# Patient Record
Sex: Female | Born: 1965 | Race: White | Hispanic: No | Marital: Married | Smoking: Former smoker
Health system: Southern US, Community
[De-identification: ages and names within clinical notes are randomized; demographics above are authoritative.]

## PROBLEM LIST (undated history)

## (undated) DIAGNOSIS — C801 Malignant (primary) neoplasm, unspecified: Secondary | ICD-10-CM

## (undated) DIAGNOSIS — E559 Vitamin D deficiency, unspecified: Secondary | ICD-10-CM

## (undated) DIAGNOSIS — F329 Major depressive disorder, single episode, unspecified: Secondary | ICD-10-CM

## (undated) DIAGNOSIS — M1990 Unspecified osteoarthritis, unspecified site: Secondary | ICD-10-CM

## (undated) DIAGNOSIS — I2510 Atherosclerotic heart disease of native coronary artery without angina pectoris: Secondary | ICD-10-CM

## (undated) DIAGNOSIS — I219 Acute myocardial infarction, unspecified: Secondary | ICD-10-CM

## (undated) DIAGNOSIS — K635 Polyp of colon: Secondary | ICD-10-CM

## (undated) DIAGNOSIS — E785 Hyperlipidemia, unspecified: Secondary | ICD-10-CM

## (undated) DIAGNOSIS — F32A Depression, unspecified: Secondary | ICD-10-CM

## (undated) DIAGNOSIS — E079 Disorder of thyroid, unspecified: Secondary | ICD-10-CM

## (undated) HISTORY — DX: Unspecified osteoarthritis, unspecified site: M19.90

## (undated) HISTORY — DX: Atherosclerotic heart disease of native coronary artery without angina pectoris: I25.10

## (undated) HISTORY — DX: Depression, unspecified: F32.A

## (undated) HISTORY — DX: Disorder of thyroid, unspecified: E07.9

## (undated) HISTORY — DX: Hyperlipidemia, unspecified: E78.5

## (undated) HISTORY — PX: CORONARY ANGIOPLASTY WITH STENT PLACEMENT: SHX49

## (undated) HISTORY — DX: Polyp of colon: K63.5

## (undated) HISTORY — PX: CARDIAC SURGERY: SHX584

## (undated) HISTORY — DX: Major depressive disorder, single episode, unspecified: F32.9

## (undated) HISTORY — DX: Vitamin D deficiency, unspecified: E55.9

## (undated) HISTORY — PX: TUBAL LIGATION: SHX77

## (undated) NOTE — Progress Notes (Signed)
Formatting of this note might be different from the original.  Patient presents today to discuss findings in recent ultrasound and MRI. No additional concerns.   Electronically signed by Marykay Lex, CMA at 06/17/2022  3:09 PM EST

## (undated) NOTE — Progress Notes (Signed)
Formatting of this note is different from the original.  HPI:       [name] is a 59 y.o. [phone] who LMP was No LMP recorded. Patient is postmenopausal.    Subjective:     She presents today to follow-up after having an MRI at [facility].  She has a history of anal cancer and significant pelvic radiation.  This has caused vaginal scarring and she says her vagina is completely scarred closed.  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  She reports that she previously had normal Pap smears prior to the radiation.  She also had an ultrasound that showed a calcified mass in the lower uterine segment area.  MRI shows this to be a small pedunculated calcified fibroid.  Patient has not had any further vaginal bleeding since her initial workup.  Endometrium by ultrasound and by MRI is not well-visualized but is reported as not thickened.  Patient has some pelvic pain but she has had this for many years and says this has not changed.  She plans on attending pelvic floor therapy to see if she can do something to open her vagina or possibly use dilators for this purpose.  She would like to have intercourse and she would like to have Pap smears in the future if possible.      Hx:  The following portions of the patient's history were reviewed and updated as appropriate:              She  has a past medical history of anal cancer, Arthritis, CAD (coronary artery disease), Colon polyp, Depression, Hyperlipidemia, MI (myocardial infarction) ([place]), Thyroid disease, and Vitamin D deficiency.  She does not have any pertinent problems on file.  She  has a past surgical history that includes Tubal ligation; Cardiac surgery (2010); Coronary angioplasty with stent; Colonoscopy with propofol (N/A, 04/17/2021); and polypectomy (N/A, 04/17/2021).  Her family history includes Alcohol abuse in her brother; Alzheimer's disease in her  maternal grandmother and mother; Breast cancer in her cousin; CAD in her father; Cancer in her paternal aunt; Cirrhosis in her brother; Healthy in her daughter, son, and son; Heart disease in her paternal grandfather, paternal grandmother, and paternal uncle; Hyperlipidemia in her brother and brother; Thyroid disease in her brother and brother.  She  reports that she quit smoking about 15 years ago. Her smoking use included cigarettes. She has a 22.50 pack-year smoking history. She has never used smokeless tobacco. She reports that she does not drink alcohol and does not use drugs.  She has a current medication list which includes the following prescription(s): acetaminophen, aspirin ec, bupropion, cyclobenzaprine, gabapentin, ibuprofen, levothyroxine, loperamide, nitroglycerin, omeprazole, ondansetron, tramadol, and venlafaxine xr.  She is allergic to sulfa antibiotics, sulfasalazine, and tape.    Review of Systems:   Review of Systems    Constitutional: Denied constitutional symptoms, night sweats, recent illness, fatigue, fever, insomnia and weight loss.   Eyes: Denied eye symptoms, eye pain, photophobia, vision change and visual disturbance.   Ears/Nose/Throat/Neck: Denied ear, nose, throat or neck symptoms, hearing loss, nasal discharge, sinus congestion and sore throat.   Cardiovascular: Denied cardiovascular symptoms, arrhythmia, chest pain/pressure, edema, exercise intolerance, orthopnea and palpitations.   Respiratory: Denied pulmonary symptoms, asthma, pleuritic pain, productive sputum, cough, dyspnea and wheezing.   Gastrointestinal: Denied, gastro-esophageal reflux, melena, nausea and vomiting.   Genitourinary: See HPI  for additional information.   Musculoskeletal: Denied musculoskeletal symptoms, stiffness, swelling, muscle weakness and myalgia.   Dermatologic: Denied dermatology symptoms, rash and scar.   Neurologic: Denied neurology symptoms, dizziness, headache, neck pain and syncope.   Psychiatric:  Denied psychiatric symptoms, anxiety and depression.   Endocrine: Denied endocrine symptoms including hot flashes and night sweats.     Meds:    Current Outpatient Medications on File Prior to Visit   Medication Sig Dispense Refill    acetaminophen (TYLENOL) 500 MG tablet Take 1,000 mg by mouth every 6 (six) hours as needed.      aspirin EC 81 MG tablet Take 1 tablet (81 mg total) by mouth daily. Swallow whole. 30 tablet 12    buPROPion (WELLBUTRIN XL) 150 MG 24 hr tablet Take 1 tablet (150 mg total) by mouth daily. 90 tablet 1    cyclobenzaprine (FLEXERIL) 10 MG tablet Take 1 tablet by oral route at bedtime. 30 tablet 0    gabapentin (NEURONTIN) 300 MG capsule TAKE 2 CAPSULES BY MOUTH ONCE DAILY AND 3 NIGHTLY      ibuprofen (ADVIL) 200 MG tablet Take 200 mg by mouth every 6 (six) hours as needed.      levothyroxine (SYNTHROID) 88 MCG tablet TAKE 1 TABLET BY MOUTH ONCE DAILY BEFORE BREAKFAST 90 tablet 0    loperamide (IMODIUM) 2 MG capsule Take 2 mg by mouth as needed for diarrhea or loose stools.      nitroGLYCERIN (NITROSTAT) 0.4 MG SL tablet Place under the tongue.      omeprazole (PRILOSEC OTC) 20 MG tablet Take 1 tablet (20 mg total) by mouth daily.      ondansetron (ZOFRAN) 4 MG tablet Take 1 tablet (4 mg total) by mouth every 8 (eight) hours as needed for nausea or vomiting. 20 tablet 0    traMADol (ULTRAM) 50 MG tablet Take 1 tablet (50 mg total) by mouth daily as needed. 30 tablet 0    venlafaxine XR (EFFEXOR XR) 37.5 MG 24 hr capsule Take 1 capsule (37.5 mg total) by mouth daily with breakfast. 30 capsule 5     No current facility-administered medications on file prior to visit.     Objective:       Vitals:    06/17/22 1443   BP: 110/75   Pulse: 79     Filed Weights    06/17/22 1443   Weight: 182 lb (82.6 kg)             Assessment:     [id number]  Patient Active Problem List    Diagnosis Date Noted    GERD (gastroesophageal reflux disease) 04/14/2022    Class 1 obesity due to excess calories with body mass  index (BMI) of 33.0 to 33.9 in adult 09/09/2021    Chronic diarrhea of unknown origin     Neutropenia ([place]) 02/04/2021    Hot flashes due to menopause 09/12/2020    Class 1 obesity due to excess calories with body mass index (BMI) of 32.0 to 32.9 in adult 06/30/2018    Dyslipidemia 06/21/2017    Depression, recurrent ([place]) 12/17/2014    Vaginal stenosis 12/19/2013    History of anal cancer 07/20/2012    Coronary artery disease involving native coronary artery of native heart with angina pectoris ([place]) 08/27/2010    DDD (degenerative disc disease), lumbosacral 07/28/2010    Hypothyroidism 02/06/2010       1. Pelvic pain    2. Fibroid  Plan:        1.  We have talked through all of the above studies and I have generally reassured her regarding her uterine fibroid and her endometrium.  I do not believe these to be a significant possible source of cancer.  We have also discussed cervical cancer and the fact that she had normal Pap smears her whole life.  It is possible that HPV of the rectum increases her risk for HPV of the cervix.  I have asked her to address this with her oncologist/surgeon.  We have also discussed the possibility of Pap smears in the OR should this be necessary.  At this time she would like to have pelvic floor therapy and try to open the vagina which will allow her to have intercourse as well as possibly Pap smears in the future.  All of her questions were answered and I believe her issues were addressed.  Orders  No orders of the defined types were placed in this encounter.     No orders of the defined types were placed in this encounter.      F/U   No follow-ups on file.  I spent 31 minutes involved in the care of this patient preparing to see the patient by obtaining and reviewing her medical history (including labs, imaging tests and prior procedures), documenting clinical information in the electronic health record (EHR), counseling and coordinating care plans, writing and sending  prescriptions, ordering tests or procedures and in direct communicating with the patient and medical staff discussing pertinent items from her history and physical exam.    Finis Bud, M.D.  06/17/2022  3:06 PM      Electronically signed by Harlin Heys, MD at 06/17/2022  3:09 PM EST

---

## 1993-05-28 ENCOUNTER — Other Ambulatory Visit (HOSPITAL_COMMUNITY): Payer: Self-pay

## 2004-01-08 ENCOUNTER — Other Ambulatory Visit: Payer: Self-pay

## 2005-04-29 ENCOUNTER — Emergency Department: Payer: Self-pay | Admitting: Emergency Medicine

## 2006-08-24 ENCOUNTER — Other Ambulatory Visit: Payer: Self-pay

## 2006-08-25 ENCOUNTER — Inpatient Hospital Stay: Payer: Self-pay | Admitting: Internal Medicine

## 2007-02-15 ENCOUNTER — Ambulatory Visit: Payer: Self-pay | Admitting: Cardiology

## 2007-12-05 ENCOUNTER — Ambulatory Visit: Payer: Self-pay | Admitting: Internal Medicine

## 2008-05-04 HISTORY — PX: CARDIAC SURGERY: SHX584

## 2010-02-03 ENCOUNTER — Ambulatory Visit: Payer: Self-pay | Admitting: Internal Medicine

## 2010-02-06 DIAGNOSIS — E039 Hypothyroidism, unspecified: Secondary | ICD-10-CM | POA: Insufficient documentation

## 2010-07-28 DIAGNOSIS — M5137 Other intervertebral disc degeneration, lumbosacral region: Secondary | ICD-10-CM | POA: Insufficient documentation

## 2010-08-27 DIAGNOSIS — I25119 Atherosclerotic heart disease of native coronary artery with unspecified angina pectoris: Secondary | ICD-10-CM | POA: Insufficient documentation

## 2012-07-20 DIAGNOSIS — C210 Malignant neoplasm of anus, unspecified: Secondary | ICD-10-CM | POA: Insufficient documentation

## 2012-07-20 DIAGNOSIS — Z85048 Personal history of other malignant neoplasm of rectum, rectosigmoid junction, and anus: Secondary | ICD-10-CM | POA: Insufficient documentation

## 2012-11-19 DIAGNOSIS — E2839 Other primary ovarian failure: Secondary | ICD-10-CM | POA: Insufficient documentation

## 2012-12-23 DIAGNOSIS — M7061 Trochanteric bursitis, right hip: Secondary | ICD-10-CM | POA: Insufficient documentation

## 2013-12-19 DIAGNOSIS — N895 Stricture and atresia of vagina: Secondary | ICD-10-CM | POA: Insufficient documentation

## 2014-12-17 DIAGNOSIS — F339 Major depressive disorder, recurrent, unspecified: Secondary | ICD-10-CM | POA: Insufficient documentation

## 2016-05-05 DIAGNOSIS — Z79899 Other long term (current) drug therapy: Secondary | ICD-10-CM | POA: Diagnosis not present

## 2016-05-05 DIAGNOSIS — M549 Dorsalgia, unspecified: Secondary | ICD-10-CM | POA: Diagnosis not present

## 2016-05-05 DIAGNOSIS — E2839 Other primary ovarian failure: Secondary | ICD-10-CM | POA: Diagnosis not present

## 2016-05-05 DIAGNOSIS — G8929 Other chronic pain: Secondary | ICD-10-CM | POA: Diagnosis not present

## 2016-05-05 DIAGNOSIS — M7062 Trochanteric bursitis, left hip: Secondary | ICD-10-CM | POA: Diagnosis not present

## 2016-05-05 DIAGNOSIS — S3993XA Unspecified injury of pelvis, initial encounter: Secondary | ICD-10-CM | POA: Diagnosis not present

## 2016-05-05 DIAGNOSIS — Z87891 Personal history of nicotine dependence: Secondary | ICD-10-CM | POA: Diagnosis not present

## 2016-05-05 DIAGNOSIS — I252 Old myocardial infarction: Secondary | ICD-10-CM | POA: Diagnosis not present

## 2016-05-05 DIAGNOSIS — Z7982 Long term (current) use of aspirin: Secondary | ICD-10-CM | POA: Diagnosis not present

## 2016-05-05 DIAGNOSIS — Z85038 Personal history of other malignant neoplasm of large intestine: Secondary | ICD-10-CM | POA: Diagnosis not present

## 2016-05-05 DIAGNOSIS — Z9221 Personal history of antineoplastic chemotherapy: Secondary | ICD-10-CM | POA: Diagnosis not present

## 2016-05-05 DIAGNOSIS — Z85048 Personal history of other malignant neoplasm of rectum, rectosigmoid junction, and anus: Secondary | ICD-10-CM | POA: Diagnosis not present

## 2016-05-05 DIAGNOSIS — Z923 Personal history of irradiation: Secondary | ICD-10-CM | POA: Diagnosis not present

## 2016-05-05 DIAGNOSIS — I2510 Atherosclerotic heart disease of native coronary artery without angina pectoris: Secondary | ICD-10-CM | POA: Diagnosis not present

## 2016-05-05 DIAGNOSIS — F419 Anxiety disorder, unspecified: Secondary | ICD-10-CM | POA: Diagnosis not present

## 2016-05-05 DIAGNOSIS — M7061 Trochanteric bursitis, right hip: Secondary | ICD-10-CM | POA: Diagnosis not present

## 2016-05-05 DIAGNOSIS — F329 Major depressive disorder, single episode, unspecified: Secondary | ICD-10-CM | POA: Diagnosis not present

## 2016-05-05 DIAGNOSIS — E039 Hypothyroidism, unspecified: Secondary | ICD-10-CM | POA: Diagnosis not present

## 2016-05-05 DIAGNOSIS — Z955 Presence of coronary angioplasty implant and graft: Secondary | ICD-10-CM | POA: Diagnosis not present

## 2016-05-05 DIAGNOSIS — M25551 Pain in right hip: Secondary | ICD-10-CM | POA: Diagnosis not present

## 2016-05-05 DIAGNOSIS — Z882 Allergy status to sulfonamides status: Secondary | ICD-10-CM | POA: Diagnosis not present

## 2016-05-05 DIAGNOSIS — Z951 Presence of aortocoronary bypass graft: Secondary | ICD-10-CM | POA: Diagnosis not present

## 2016-05-05 DIAGNOSIS — S79921A Unspecified injury of right thigh, initial encounter: Secondary | ICD-10-CM | POA: Diagnosis not present

## 2016-05-08 DIAGNOSIS — I252 Old myocardial infarction: Secondary | ICD-10-CM | POA: Diagnosis not present

## 2016-05-08 DIAGNOSIS — Z951 Presence of aortocoronary bypass graft: Secondary | ICD-10-CM | POA: Diagnosis not present

## 2016-05-08 DIAGNOSIS — Z8041 Family history of malignant neoplasm of ovary: Secondary | ICD-10-CM | POA: Diagnosis not present

## 2016-05-08 DIAGNOSIS — Z882 Allergy status to sulfonamides status: Secondary | ICD-10-CM | POA: Diagnosis not present

## 2016-05-08 DIAGNOSIS — E039 Hypothyroidism, unspecified: Secondary | ICD-10-CM | POA: Diagnosis not present

## 2016-05-08 DIAGNOSIS — Z7982 Long term (current) use of aspirin: Secondary | ICD-10-CM | POA: Diagnosis not present

## 2016-05-08 DIAGNOSIS — Z87891 Personal history of nicotine dependence: Secondary | ICD-10-CM | POA: Diagnosis not present

## 2016-05-08 DIAGNOSIS — N8184 Pelvic muscle wasting: Secondary | ICD-10-CM | POA: Diagnosis not present

## 2016-05-08 DIAGNOSIS — M62838 Other muscle spasm: Secondary | ICD-10-CM | POA: Diagnosis not present

## 2016-05-08 DIAGNOSIS — Z85038 Personal history of other malignant neoplasm of large intestine: Secondary | ICD-10-CM | POA: Diagnosis not present

## 2016-05-08 DIAGNOSIS — I2510 Atherosclerotic heart disease of native coronary artery without angina pectoris: Secondary | ICD-10-CM | POA: Diagnosis not present

## 2016-05-08 DIAGNOSIS — Z79899 Other long term (current) drug therapy: Secondary | ICD-10-CM | POA: Diagnosis not present

## 2016-05-08 DIAGNOSIS — R102 Pelvic and perineal pain: Secondary | ICD-10-CM | POA: Diagnosis not present

## 2016-05-08 DIAGNOSIS — Z955 Presence of coronary angioplasty implant and graft: Secondary | ICD-10-CM | POA: Diagnosis not present

## 2016-05-08 DIAGNOSIS — Z9221 Personal history of antineoplastic chemotherapy: Secondary | ICD-10-CM | POA: Diagnosis not present

## 2016-05-08 DIAGNOSIS — N941 Unspecified dyspareunia: Secondary | ICD-10-CM | POA: Diagnosis not present

## 2016-05-08 DIAGNOSIS — Z803 Family history of malignant neoplasm of breast: Secondary | ICD-10-CM | POA: Diagnosis not present

## 2016-05-08 DIAGNOSIS — Z923 Personal history of irradiation: Secondary | ICD-10-CM | POA: Diagnosis not present

## 2016-05-08 DIAGNOSIS — Z85048 Personal history of other malignant neoplasm of rectum, rectosigmoid junction, and anus: Secondary | ICD-10-CM | POA: Diagnosis not present

## 2016-05-14 DIAGNOSIS — M62838 Other muscle spasm: Secondary | ICD-10-CM | POA: Diagnosis not present

## 2016-05-14 DIAGNOSIS — R102 Pelvic and perineal pain: Secondary | ICD-10-CM | POA: Diagnosis not present

## 2016-05-14 DIAGNOSIS — Z955 Presence of coronary angioplasty implant and graft: Secondary | ICD-10-CM | POA: Diagnosis not present

## 2016-05-14 DIAGNOSIS — I2510 Atherosclerotic heart disease of native coronary artery without angina pectoris: Secondary | ICD-10-CM | POA: Diagnosis not present

## 2016-05-14 DIAGNOSIS — N8184 Pelvic muscle wasting: Secondary | ICD-10-CM | POA: Diagnosis not present

## 2016-05-14 DIAGNOSIS — N941 Unspecified dyspareunia: Secondary | ICD-10-CM | POA: Diagnosis not present

## 2016-06-04 DIAGNOSIS — Z955 Presence of coronary angioplasty implant and graft: Secondary | ICD-10-CM | POA: Diagnosis not present

## 2016-06-04 DIAGNOSIS — R102 Pelvic and perineal pain: Secondary | ICD-10-CM | POA: Diagnosis not present

## 2016-06-04 DIAGNOSIS — I2510 Atherosclerotic heart disease of native coronary artery without angina pectoris: Secondary | ICD-10-CM | POA: Diagnosis not present

## 2016-06-04 DIAGNOSIS — N941 Unspecified dyspareunia: Secondary | ICD-10-CM | POA: Diagnosis not present

## 2016-06-04 DIAGNOSIS — N8184 Pelvic muscle wasting: Secondary | ICD-10-CM | POA: Diagnosis not present

## 2016-06-04 DIAGNOSIS — M62838 Other muscle spasm: Secondary | ICD-10-CM | POA: Diagnosis not present

## 2016-07-09 DIAGNOSIS — M79662 Pain in left lower leg: Secondary | ICD-10-CM | POA: Diagnosis not present

## 2016-07-09 DIAGNOSIS — Z882 Allergy status to sulfonamides status: Secondary | ICD-10-CM | POA: Diagnosis not present

## 2016-07-09 DIAGNOSIS — Z85038 Personal history of other malignant neoplasm of large intestine: Secondary | ICD-10-CM | POA: Diagnosis not present

## 2016-07-09 DIAGNOSIS — Z79899 Other long term (current) drug therapy: Secondary | ICD-10-CM | POA: Diagnosis not present

## 2016-07-09 DIAGNOSIS — I2510 Atherosclerotic heart disease of native coronary artery without angina pectoris: Secondary | ICD-10-CM | POA: Diagnosis not present

## 2016-07-09 DIAGNOSIS — I83812 Varicose veins of left lower extremities with pain: Secondary | ICD-10-CM | POA: Diagnosis not present

## 2016-07-09 DIAGNOSIS — Z9221 Personal history of antineoplastic chemotherapy: Secondary | ICD-10-CM | POA: Diagnosis not present

## 2016-07-09 DIAGNOSIS — I252 Old myocardial infarction: Secondary | ICD-10-CM | POA: Diagnosis not present

## 2016-07-09 DIAGNOSIS — Z951 Presence of aortocoronary bypass graft: Secondary | ICD-10-CM | POA: Diagnosis not present

## 2016-07-09 DIAGNOSIS — E039 Hypothyroidism, unspecified: Secondary | ICD-10-CM | POA: Diagnosis not present

## 2016-07-09 DIAGNOSIS — Z7982 Long term (current) use of aspirin: Secondary | ICD-10-CM | POA: Diagnosis not present

## 2016-07-09 DIAGNOSIS — Z87891 Personal history of nicotine dependence: Secondary | ICD-10-CM | POA: Diagnosis not present

## 2016-07-09 DIAGNOSIS — F419 Anxiety disorder, unspecified: Secondary | ICD-10-CM | POA: Diagnosis not present

## 2016-08-26 DIAGNOSIS — N941 Unspecified dyspareunia: Secondary | ICD-10-CM | POA: Diagnosis not present

## 2016-08-26 DIAGNOSIS — G629 Polyneuropathy, unspecified: Secondary | ICD-10-CM | POA: Diagnosis not present

## 2016-08-26 DIAGNOSIS — Z1159 Encounter for screening for other viral diseases: Secondary | ICD-10-CM | POA: Diagnosis not present

## 2016-08-26 DIAGNOSIS — M5441 Lumbago with sciatica, right side: Secondary | ICD-10-CM | POA: Diagnosis not present

## 2016-08-26 DIAGNOSIS — M6289 Other specified disorders of muscle: Secondary | ICD-10-CM | POA: Diagnosis not present

## 2016-08-26 DIAGNOSIS — R200 Anesthesia of skin: Secondary | ICD-10-CM | POA: Diagnosis not present

## 2016-08-26 DIAGNOSIS — M545 Low back pain: Secondary | ICD-10-CM | POA: Diagnosis not present

## 2016-08-26 DIAGNOSIS — G8929 Other chronic pain: Secondary | ICD-10-CM | POA: Diagnosis not present

## 2016-08-26 DIAGNOSIS — R5383 Other fatigue: Secondary | ICD-10-CM | POA: Diagnosis not present

## 2016-08-26 DIAGNOSIS — R202 Paresthesia of skin: Secondary | ICD-10-CM | POA: Diagnosis not present

## 2016-08-26 DIAGNOSIS — H538 Other visual disturbances: Secondary | ICD-10-CM | POA: Diagnosis not present

## 2016-08-26 DIAGNOSIS — R946 Abnormal results of thyroid function studies: Secondary | ICD-10-CM | POA: Diagnosis not present

## 2016-08-26 DIAGNOSIS — H539 Unspecified visual disturbance: Secondary | ICD-10-CM | POA: Diagnosis not present

## 2016-08-26 DIAGNOSIS — F329 Major depressive disorder, single episode, unspecified: Secondary | ICD-10-CM | POA: Diagnosis not present

## 2016-08-26 DIAGNOSIS — D649 Anemia, unspecified: Secondary | ICD-10-CM | POA: Diagnosis not present

## 2016-08-26 DIAGNOSIS — R208 Other disturbances of skin sensation: Secondary | ICD-10-CM | POA: Diagnosis not present

## 2016-09-25 DIAGNOSIS — Z79899 Other long term (current) drug therapy: Secondary | ICD-10-CM | POA: Diagnosis not present

## 2016-09-25 DIAGNOSIS — R42 Dizziness and giddiness: Secondary | ICD-10-CM | POA: Diagnosis not present

## 2016-09-25 DIAGNOSIS — I252 Old myocardial infarction: Secondary | ICD-10-CM | POA: Diagnosis not present

## 2016-09-25 DIAGNOSIS — R5383 Other fatigue: Secondary | ICD-10-CM | POA: Diagnosis not present

## 2016-09-25 DIAGNOSIS — I2510 Atherosclerotic heart disease of native coronary artery without angina pectoris: Secondary | ICD-10-CM | POA: Diagnosis not present

## 2016-09-25 DIAGNOSIS — Z7982 Long term (current) use of aspirin: Secondary | ICD-10-CM | POA: Diagnosis not present

## 2016-09-25 DIAGNOSIS — E039 Hypothyroidism, unspecified: Secondary | ICD-10-CM | POA: Diagnosis not present

## 2016-09-25 DIAGNOSIS — E559 Vitamin D deficiency, unspecified: Secondary | ICD-10-CM | POA: Diagnosis not present

## 2016-09-25 DIAGNOSIS — R200 Anesthesia of skin: Secondary | ICD-10-CM | POA: Diagnosis not present

## 2016-09-25 DIAGNOSIS — R202 Paresthesia of skin: Secondary | ICD-10-CM | POA: Diagnosis not present

## 2016-09-25 DIAGNOSIS — R591 Generalized enlarged lymph nodes: Secondary | ICD-10-CM | POA: Diagnosis not present

## 2016-10-06 DIAGNOSIS — R197 Diarrhea, unspecified: Secondary | ICD-10-CM | POA: Diagnosis not present

## 2016-10-06 DIAGNOSIS — Z79899 Other long term (current) drug therapy: Secondary | ICD-10-CM | POA: Diagnosis not present

## 2016-10-06 DIAGNOSIS — Z923 Personal history of irradiation: Secondary | ICD-10-CM | POA: Diagnosis not present

## 2016-10-06 DIAGNOSIS — E039 Hypothyroidism, unspecified: Secondary | ICD-10-CM | POA: Diagnosis not present

## 2016-10-06 DIAGNOSIS — Z9221 Personal history of antineoplastic chemotherapy: Secondary | ICD-10-CM | POA: Diagnosis not present

## 2016-10-06 DIAGNOSIS — C210 Malignant neoplasm of anus, unspecified: Secondary | ICD-10-CM | POA: Diagnosis not present

## 2016-10-06 DIAGNOSIS — Z08 Encounter for follow-up examination after completed treatment for malignant neoplasm: Secondary | ICD-10-CM | POA: Diagnosis not present

## 2016-10-06 DIAGNOSIS — Z85048 Personal history of other malignant neoplasm of rectum, rectosigmoid junction, and anus: Secondary | ICD-10-CM | POA: Diagnosis not present

## 2016-10-16 DIAGNOSIS — R590 Localized enlarged lymph nodes: Secondary | ICD-10-CM | POA: Diagnosis not present

## 2016-10-16 DIAGNOSIS — N644 Mastodynia: Secondary | ICD-10-CM | POA: Diagnosis not present

## 2016-10-28 DIAGNOSIS — I2510 Atherosclerotic heart disease of native coronary artery without angina pectoris: Secondary | ICD-10-CM | POA: Diagnosis not present

## 2016-10-28 DIAGNOSIS — E785 Hyperlipidemia, unspecified: Secondary | ICD-10-CM | POA: Diagnosis not present

## 2016-10-28 DIAGNOSIS — E039 Hypothyroidism, unspecified: Secondary | ICD-10-CM | POA: Diagnosis not present

## 2016-11-20 DIAGNOSIS — M542 Cervicalgia: Secondary | ICD-10-CM | POA: Diagnosis not present

## 2017-01-05 DIAGNOSIS — E039 Hypothyroidism, unspecified: Secondary | ICD-10-CM | POA: Diagnosis not present

## 2017-01-05 DIAGNOSIS — M25512 Pain in left shoulder: Secondary | ICD-10-CM | POA: Diagnosis not present

## 2017-01-05 DIAGNOSIS — G8911 Acute pain due to trauma: Secondary | ICD-10-CM | POA: Diagnosis not present

## 2017-01-05 DIAGNOSIS — Z1389 Encounter for screening for other disorder: Secondary | ICD-10-CM | POA: Diagnosis not present

## 2017-01-05 DIAGNOSIS — R350 Frequency of micturition: Secondary | ICD-10-CM | POA: Diagnosis not present

## 2017-01-21 DIAGNOSIS — F339 Major depressive disorder, recurrent, unspecified: Secondary | ICD-10-CM | POA: Diagnosis not present

## 2017-01-21 DIAGNOSIS — E559 Vitamin D deficiency, unspecified: Secondary | ICD-10-CM | POA: Diagnosis not present

## 2017-01-21 DIAGNOSIS — E039 Hypothyroidism, unspecified: Secondary | ICD-10-CM | POA: Diagnosis not present

## 2017-01-21 DIAGNOSIS — R109 Unspecified abdominal pain: Secondary | ICD-10-CM | POA: Diagnosis not present

## 2017-01-21 DIAGNOSIS — M25512 Pain in left shoulder: Secondary | ICD-10-CM | POA: Diagnosis not present

## 2017-01-21 DIAGNOSIS — Z23 Encounter for immunization: Secondary | ICD-10-CM | POA: Diagnosis not present

## 2017-01-21 DIAGNOSIS — I259 Chronic ischemic heart disease, unspecified: Secondary | ICD-10-CM | POA: Diagnosis not present

## 2017-01-21 DIAGNOSIS — G893 Neoplasm related pain (acute) (chronic): Secondary | ICD-10-CM | POA: Insufficient documentation

## 2017-01-21 DIAGNOSIS — G8911 Acute pain due to trauma: Secondary | ICD-10-CM | POA: Diagnosis not present

## 2017-01-21 DIAGNOSIS — C210 Malignant neoplasm of anus, unspecified: Secondary | ICD-10-CM | POA: Diagnosis not present

## 2017-02-09 DIAGNOSIS — G893 Neoplasm related pain (acute) (chronic): Secondary | ICD-10-CM | POA: Diagnosis not present

## 2017-02-09 DIAGNOSIS — M7061 Trochanteric bursitis, right hip: Secondary | ICD-10-CM | POA: Diagnosis not present

## 2017-02-09 DIAGNOSIS — K219 Gastro-esophageal reflux disease without esophagitis: Secondary | ICD-10-CM | POA: Diagnosis not present

## 2017-02-09 DIAGNOSIS — M7062 Trochanteric bursitis, left hip: Secondary | ICD-10-CM | POA: Diagnosis not present

## 2017-02-09 DIAGNOSIS — M5137 Other intervertebral disc degeneration, lumbosacral region: Secondary | ICD-10-CM | POA: Diagnosis not present

## 2017-02-09 DIAGNOSIS — C210 Malignant neoplasm of anus, unspecified: Secondary | ICD-10-CM | POA: Diagnosis not present

## 2017-02-09 DIAGNOSIS — F339 Major depressive disorder, recurrent, unspecified: Secondary | ICD-10-CM | POA: Diagnosis not present

## 2017-03-23 ENCOUNTER — Emergency Department: Payer: Medicare Other

## 2017-03-23 ENCOUNTER — Emergency Department
Admission: EM | Admit: 2017-03-23 | Discharge: 2017-03-23 | Disposition: A | Discharge status: Home / Self Care | Payer: Medicare Other | Attending: Emergency Medicine | Admitting: Emergency Medicine

## 2017-03-23 ENCOUNTER — Encounter: Payer: Self-pay | Admitting: Emergency Medicine

## 2017-03-23 DIAGNOSIS — I200 Unstable angina: Secondary | ICD-10-CM | POA: Insufficient documentation

## 2017-03-23 DIAGNOSIS — R079 Chest pain, unspecified: Secondary | ICD-10-CM | POA: Diagnosis not present

## 2017-03-23 DIAGNOSIS — Z7901 Long term (current) use of anticoagulants: Secondary | ICD-10-CM | POA: Insufficient documentation

## 2017-03-23 DIAGNOSIS — I252 Old myocardial infarction: Secondary | ICD-10-CM | POA: Diagnosis not present

## 2017-03-23 DIAGNOSIS — Z859 Personal history of malignant neoplasm, unspecified: Secondary | ICD-10-CM | POA: Insufficient documentation

## 2017-03-23 HISTORY — DX: Malignant (primary) neoplasm, unspecified: C80.1

## 2017-03-23 HISTORY — DX: Acute myocardial infarction, unspecified: I21.9

## 2017-03-23 LAB — BASIC METABOLIC PANEL
Anion gap: 7 (ref 5–15)
BUN: 16 mg/dL (ref 6–20)
CHLORIDE: 105 mmol/L (ref 101–111)
CO2: 27 mmol/L (ref 22–32)
Calcium: 8.8 mg/dL — ABNORMAL LOW (ref 8.9–10.3)
Creatinine, Ser: 0.72 mg/dL (ref 0.44–1.00)
GFR calc Af Amer: >60 mL/min (ref >60)
GFR calc non Af Amer: >60 mL/min (ref >60)
GLUCOSE: 95 mg/dL (ref 65–99)
POTASSIUM: 3.1 mmol/L — AB (ref 3.5–5.1)
Sodium: 139 mmol/L (ref 135–145)

## 2017-03-23 LAB — TROPONIN I
Troponin I: <0.03 ng/mL (ref <0.03)
Troponin I: <0.03 ng/mL (ref <0.03)

## 2017-03-23 LAB — CBC
HEMATOCRIT: 38.8 % (ref 35.0–47.0)
Hemoglobin: 13.1 g/dL (ref 12.0–16.0)
MCH: 33.3 pg (ref 26.0–34.0)
MCHC: 33.8 g/dL (ref 32.0–36.0)
MCV: 98.5 fL (ref 80.0–100.0)
Platelets: 185 10*3/uL (ref 150–440)
RBC: 3.94 MIL/uL (ref 3.80–5.20)
RDW: 14.1 % (ref 11.5–14.5)
WBC: 2.7 10*3/uL — ABNORMAL LOW (ref 3.6–11.0)

## 2017-03-23 MED ORDER — NITROGLYCERIN 0.4 MG SL SUBL: 0.4000 mg | SUBLINGUAL_TABLET | SUBLINGUAL | 3 refills | Status: DC | PRN

## 2017-03-23 MED ORDER — NITROGLYCERIN 0.4 MG SL SUBL
0.4000 mg | SUBLINGUAL_TABLET | Freq: Once | SUBLINGUAL | Status: AC
  Administered 2017-03-23: 0.4 mg via SUBLINGUAL

## 2017-03-23 MED ORDER — NITROGLYCERIN 0.4 MG SL SUBL
SUBLINGUAL_TABLET | SUBLINGUAL | Status: AC
  Filled 2017-03-23: qty 1

## 2017-03-23 NOTE — ED Provider Notes (Signed)
[facility] Emergency Department Provider Note       Time seen: ----------------------------------------- 8:24 PM on 03/23/2017 -----------------------------------------   I have reviewed the triage vital signs and the nursing notes.  HISTORY   Chief Complaint Chest Pain    HPI Brenda Grimes is a 51 y.o. female with a history of cancer and MI who presents to the ED for intermittent chest pain for the last month.  Patient notes that it is worse typically when she lays down at night.  She is not sure if it wakes her up from sleep.  Patient reports she had an MI 10 years ago when she cannot remember exactly how she felt.  She does take blood thinners but has not had to take any nitroglycerin.  She also reports she cannot take her hyperlipidemia medications due to muscle pain.  Pain is mild at this time.  Past Medical History:  Diagnosis Date  . Cancer ([place])   . MI (myocardial infarction) ([place])     There are no active problems to display for this patient.   Past Surgical History:  Procedure Laterality Date  . TUBAL LIGATION      Allergies Sulfa antibiotics  Social History Social History   Tobacco Use  . Smoking status: Never Smoker  . Smokeless tobacco: Never Used  Substance Use Topics  . Alcohol use: No    Frequency: Never  . Drug use: No    Review of Systems Constitutional: Negative for fever. Eyes: Negative for vision changes ENT:  Negative for congestion, sore throat Cardiovascular: positive for chest pain Respiratory: Negative for shortness of breath. Gastrointestinal: Negative for abdominal pain, vomiting and diarrhea. Genitourinary: Negative for dysuria. Musculoskeletal: Negative for back pain. Skin: Negative for rash. Neurological: Negative for headaches, focal weakness or numbness.  All systems negative/normal/unremarkable except as stated in the HPI  ____________________________________________   PHYSICAL EXAM:  VITAL  SIGNS: ED Triage Vitals  Enc Vitals Group     BP 03/23/17 1726 111/79     Pulse Rate 03/23/17 1726 88     Resp 03/23/17 1726 16     Temp 03/23/17 1726 97.8 F (36.6 C)     Temp Source 03/23/17 1726 Oral     SpO2 03/23/17 1726 100 %     Weight 03/23/17 1723 190 lb (86.2 kg)     Height 03/23/17 1723 5\' 4"  (1.626 m)     Head Circumference --      Peak Flow --      Pain Score 03/23/17 1723 4     Pain Loc --      Pain Edu? --      Excl. in [place]? --     Constitutional: Alert and oriented. Well appearing and in no distress. Eyes: Conjunctivae are normal. Normal extraocular movements. ENT   Head: Normocephalic and atraumatic.   Nose: No congestion/rhinnorhea.   Mouth/Throat: Mucous membranes are moist.   Neck: No stridor. Cardiovascular: Normal rate, regular rhythm. No murmurs, rubs, or gallops. Respiratory: Normal respiratory effort without tachypnea nor retractions. Breath sounds are clear and equal bilaterally. No wheezes/rales/rhonchi. Gastrointestinal: Soft and nontender. Normal bowel sounds Musculoskeletal: Nontender with normal range of motion in extremities. No lower extremity tenderness nor edema. Neurologic:  Normal speech and language. No gross focal neurologic deficits are appreciated.  Skin:  Skin is warm, dry and intact. No rash noted. Psychiatric: Mood and affect are normal. Speech and behavior are normal.  ____________________________________________  EKG: Interpreted by me.  Sinus rhythm  rate 89 bpm, normal PR interval, incomplete right bundle branch block, normal QT  ____________________________________________  ED COURSE:  Pertinent labs & imaging results that were available during my care of the patient were reviewed by me and considered in my medical decision making (see chart for details). Patient presents for chest pain, we will assess with labs and imaging as indicated.   Procedures ____________________________________________   LABS  (pertinent positives/negatives)  Labs Reviewed  BASIC METABOLIC PANEL - Abnormal; Notable for the following components:      Result Value   Potassium 3.1 (*)    Calcium 8.8 (*)    All other components within normal limits  CBC - Abnormal; Notable for the following components:   WBC 2.7 (*)    All other components within normal limits  TROPONIN I  TROPONIN I    RADIOLOGY  Chest x-ray is unremarkable  ____________________________________________  DIFFERENTIAL DIAGNOSIS   Prinzmetal's angina, unstable angina, MI, PE, dissection, GERD, muscle spasm  FINAL ASSESSMENT AND PLAN  Chest pain  Plan: Patient had presented for chest pain. Patient's labs were negative. Patient's imaging was also negative.  She does have symptoms of angina and I will prescribe nitroglycerin for her.  She does not want to stay in the hospital.  I have advised that she does need follow-up with cardiology urgently for stress testing or cardiac catheterization.  I will prescribe nitroglycerin for her to use at home as needed.  She is encouraged to return for worsening or worrisome symptoms in any way.   Earleen Newport, MD   Note: This note was generated in part or whole with voice recognition software. Voice recognition is usually quite accurate but there are transcription errors that can and very often do occur. I apologize for any typographical errors that were not detected and corrected.     Earleen Newport, MD 03/23/17 [phone]

## 2017-03-23 NOTE — ED Triage Notes (Signed)
Patient presents to ED via POV from home with c/o on and off CP x 1 month. Patient states today she had a sudden onset of the pain while driving. Hx of MI 10 years ago. Patient states the pain feels similar.

## 2017-03-30 DIAGNOSIS — K2960 Other gastritis without bleeding: Secondary | ICD-10-CM | POA: Diagnosis not present

## 2017-03-30 DIAGNOSIS — Z79899 Other long term (current) drug therapy: Secondary | ICD-10-CM | POA: Diagnosis not present

## 2017-03-30 DIAGNOSIS — Z1211 Encounter for screening for malignant neoplasm of colon: Secondary | ICD-10-CM | POA: Diagnosis not present

## 2017-03-30 DIAGNOSIS — Z9221 Personal history of antineoplastic chemotherapy: Secondary | ICD-10-CM | POA: Diagnosis not present

## 2017-03-30 DIAGNOSIS — D369 Benign neoplasm, unspecified site: Secondary | ICD-10-CM | POA: Diagnosis not present

## 2017-03-30 DIAGNOSIS — Z882 Allergy status to sulfonamides status: Secondary | ICD-10-CM | POA: Diagnosis not present

## 2017-03-30 DIAGNOSIS — K210 Gastro-esophageal reflux disease with esophagitis: Secondary | ICD-10-CM | POA: Diagnosis not present

## 2017-03-30 DIAGNOSIS — K2940 Chronic atrophic gastritis without bleeding: Secondary | ICD-10-CM | POA: Diagnosis not present

## 2017-03-30 DIAGNOSIS — I2510 Atherosclerotic heart disease of native coronary artery without angina pectoris: Secondary | ICD-10-CM | POA: Diagnosis not present

## 2017-03-30 DIAGNOSIS — K222 Esophageal obstruction: Secondary | ICD-10-CM | POA: Diagnosis not present

## 2017-03-30 DIAGNOSIS — K5730 Diverticulosis of large intestine without perforation or abscess without bleeding: Secondary | ICD-10-CM | POA: Diagnosis not present

## 2017-03-30 DIAGNOSIS — K6389 Other specified diseases of intestine: Secondary | ICD-10-CM | POA: Diagnosis not present

## 2017-03-30 DIAGNOSIS — K2930 Chronic superficial gastritis without bleeding: Secondary | ICD-10-CM | POA: Diagnosis not present

## 2017-03-30 DIAGNOSIS — Z85048 Personal history of other malignant neoplasm of rectum, rectosigmoid junction, and anus: Secondary | ICD-10-CM | POA: Diagnosis not present

## 2017-03-30 DIAGNOSIS — D123 Benign neoplasm of transverse colon: Secondary | ICD-10-CM | POA: Diagnosis not present

## 2017-03-30 DIAGNOSIS — E039 Hypothyroidism, unspecified: Secondary | ICD-10-CM | POA: Diagnosis not present

## 2017-03-30 DIAGNOSIS — Z7951 Long term (current) use of inhaled steroids: Secondary | ICD-10-CM | POA: Diagnosis not present

## 2017-03-30 DIAGNOSIS — R1013 Epigastric pain: Secondary | ICD-10-CM | POA: Diagnosis not present

## 2017-03-30 DIAGNOSIS — D126 Benign neoplasm of colon, unspecified: Secondary | ICD-10-CM | POA: Diagnosis not present

## 2017-03-30 DIAGNOSIS — K449 Diaphragmatic hernia without obstruction or gangrene: Secondary | ICD-10-CM | POA: Diagnosis not present

## 2017-03-30 DIAGNOSIS — K648 Other hemorrhoids: Secondary | ICD-10-CM | POA: Diagnosis not present

## 2017-03-30 DIAGNOSIS — Z7982 Long term (current) use of aspirin: Secondary | ICD-10-CM | POA: Diagnosis not present

## 2017-03-30 DIAGNOSIS — Z923 Personal history of irradiation: Secondary | ICD-10-CM | POA: Diagnosis not present

## 2017-03-30 DIAGNOSIS — Z951 Presence of aortocoronary bypass graft: Secondary | ICD-10-CM | POA: Diagnosis not present

## 2017-03-30 DIAGNOSIS — I252 Old myocardial infarction: Secondary | ICD-10-CM | POA: Diagnosis not present

## 2017-03-30 DIAGNOSIS — Z85038 Personal history of other malignant neoplasm of large intestine: Secondary | ICD-10-CM | POA: Diagnosis not present

## 2017-03-30 DIAGNOSIS — K228 Other specified diseases of esophagus: Secondary | ICD-10-CM | POA: Diagnosis not present

## 2017-03-30 LAB — HM COLONOSCOPY

## 2017-06-14 DIAGNOSIS — R454 Irritability and anger: Secondary | ICD-10-CM | POA: Diagnosis not present

## 2017-06-14 DIAGNOSIS — R6882 Decreased libido: Secondary | ICD-10-CM | POA: Diagnosis not present

## 2017-06-16 DIAGNOSIS — R12 Heartburn: Secondary | ICD-10-CM | POA: Diagnosis not present

## 2017-06-16 DIAGNOSIS — R079 Chest pain, unspecified: Secondary | ICD-10-CM | POA: Diagnosis not present

## 2017-06-16 DIAGNOSIS — R109 Unspecified abdominal pain: Secondary | ICD-10-CM | POA: Diagnosis not present

## 2017-06-16 DIAGNOSIS — Z7982 Long term (current) use of aspirin: Secondary | ICD-10-CM | POA: Diagnosis not present

## 2017-06-16 DIAGNOSIS — I252 Old myocardial infarction: Secondary | ICD-10-CM | POA: Diagnosis not present

## 2017-06-16 DIAGNOSIS — E039 Hypothyroidism, unspecified: Secondary | ICD-10-CM | POA: Diagnosis not present

## 2017-06-16 DIAGNOSIS — Z79899 Other long term (current) drug therapy: Secondary | ICD-10-CM | POA: Diagnosis not present

## 2017-06-16 DIAGNOSIS — Z87891 Personal history of nicotine dependence: Secondary | ICD-10-CM | POA: Diagnosis not present

## 2017-06-16 DIAGNOSIS — Z923 Personal history of irradiation: Secondary | ICD-10-CM | POA: Diagnosis not present

## 2017-06-16 DIAGNOSIS — E785 Hyperlipidemia, unspecified: Secondary | ICD-10-CM | POA: Diagnosis not present

## 2017-06-16 DIAGNOSIS — I25119 Atherosclerotic heart disease of native coronary artery with unspecified angina pectoris: Secondary | ICD-10-CM | POA: Diagnosis not present

## 2017-06-16 DIAGNOSIS — F329 Major depressive disorder, single episode, unspecified: Secondary | ICD-10-CM | POA: Diagnosis not present

## 2017-06-16 DIAGNOSIS — Z882 Allergy status to sulfonamides status: Secondary | ICD-10-CM | POA: Diagnosis not present

## 2017-06-21 DIAGNOSIS — E785 Hyperlipidemia, unspecified: Secondary | ICD-10-CM | POA: Insufficient documentation

## 2017-06-23 DIAGNOSIS — M1611 Unilateral primary osteoarthritis, right hip: Secondary | ICD-10-CM | POA: Diagnosis not present

## 2017-06-23 DIAGNOSIS — M160 Bilateral primary osteoarthritis of hip: Secondary | ICD-10-CM | POA: Diagnosis not present

## 2017-06-23 DIAGNOSIS — M25551 Pain in right hip: Secondary | ICD-10-CM | POA: Diagnosis not present

## 2017-06-25 DIAGNOSIS — Z79899 Other long term (current) drug therapy: Secondary | ICD-10-CM | POA: Diagnosis not present

## 2017-06-25 DIAGNOSIS — G8929 Other chronic pain: Secondary | ICD-10-CM | POA: Diagnosis not present

## 2017-06-25 DIAGNOSIS — Z791 Long term (current) use of non-steroidal anti-inflammatories (NSAID): Secondary | ICD-10-CM | POA: Diagnosis not present

## 2017-06-25 DIAGNOSIS — Z8249 Family history of ischemic heart disease and other diseases of the circulatory system: Secondary | ICD-10-CM | POA: Diagnosis not present

## 2017-06-25 DIAGNOSIS — Z951 Presence of aortocoronary bypass graft: Secondary | ICD-10-CM | POA: Diagnosis not present

## 2017-06-25 DIAGNOSIS — Z85038 Personal history of other malignant neoplasm of large intestine: Secondary | ICD-10-CM | POA: Diagnosis not present

## 2017-06-25 DIAGNOSIS — Z85048 Personal history of other malignant neoplasm of rectum, rectosigmoid junction, and anus: Secondary | ICD-10-CM | POA: Diagnosis not present

## 2017-06-25 DIAGNOSIS — Z87891 Personal history of nicotine dependence: Secondary | ICD-10-CM | POA: Diagnosis not present

## 2017-06-25 DIAGNOSIS — E039 Hypothyroidism, unspecified: Secondary | ICD-10-CM | POA: Diagnosis not present

## 2017-06-25 DIAGNOSIS — Z79891 Long term (current) use of opiate analgesic: Secondary | ICD-10-CM | POA: Diagnosis not present

## 2017-06-25 DIAGNOSIS — I2510 Atherosclerotic heart disease of native coronary artery without angina pectoris: Secondary | ICD-10-CM | POA: Diagnosis not present

## 2017-06-25 DIAGNOSIS — M5137 Other intervertebral disc degeneration, lumbosacral region: Secondary | ICD-10-CM | POA: Diagnosis not present

## 2017-06-25 DIAGNOSIS — M47814 Spondylosis without myelopathy or radiculopathy, thoracic region: Secondary | ICD-10-CM | POA: Diagnosis not present

## 2017-06-25 DIAGNOSIS — Z7982 Long term (current) use of aspirin: Secondary | ICD-10-CM | POA: Diagnosis not present

## 2017-06-25 DIAGNOSIS — F419 Anxiety disorder, unspecified: Secondary | ICD-10-CM | POA: Diagnosis not present

## 2017-06-25 DIAGNOSIS — M47816 Spondylosis without myelopathy or radiculopathy, lumbar region: Secondary | ICD-10-CM | POA: Diagnosis not present

## 2017-06-25 DIAGNOSIS — Z7951 Long term (current) use of inhaled steroids: Secondary | ICD-10-CM | POA: Diagnosis not present

## 2017-06-25 DIAGNOSIS — Z882 Allergy status to sulfonamides status: Secondary | ICD-10-CM | POA: Diagnosis not present

## 2017-06-25 DIAGNOSIS — M5030 Other cervical disc degeneration, unspecified cervical region: Secondary | ICD-10-CM | POA: Diagnosis not present

## 2017-06-25 DIAGNOSIS — M5033 Other cervical disc degeneration, cervicothoracic region: Secondary | ICD-10-CM | POA: Diagnosis not present

## 2017-06-25 DIAGNOSIS — Z923 Personal history of irradiation: Secondary | ICD-10-CM | POA: Diagnosis not present

## 2017-06-25 DIAGNOSIS — M545 Low back pain: Secondary | ICD-10-CM | POA: Diagnosis not present

## 2017-06-25 DIAGNOSIS — M47812 Spondylosis without myelopathy or radiculopathy, cervical region: Secondary | ICD-10-CM | POA: Diagnosis not present

## 2017-06-25 DIAGNOSIS — Z9221 Personal history of antineoplastic chemotherapy: Secondary | ICD-10-CM | POA: Diagnosis not present

## 2017-06-25 DIAGNOSIS — F329 Major depressive disorder, single episode, unspecified: Secondary | ICD-10-CM | POA: Diagnosis not present

## 2017-06-25 DIAGNOSIS — I252 Old myocardial infarction: Secondary | ICD-10-CM | POA: Diagnosis not present

## 2017-06-28 DIAGNOSIS — Z0389 Encounter for observation for other suspected diseases and conditions ruled out: Secondary | ICD-10-CM | POA: Diagnosis not present

## 2017-06-28 DIAGNOSIS — R079 Chest pain, unspecified: Secondary | ICD-10-CM | POA: Diagnosis not present

## 2017-07-05 DIAGNOSIS — G8911 Acute pain due to trauma: Secondary | ICD-10-CM | POA: Diagnosis not present

## 2017-07-05 DIAGNOSIS — C210 Malignant neoplasm of anus, unspecified: Secondary | ICD-10-CM | POA: Diagnosis not present

## 2017-07-05 DIAGNOSIS — M25512 Pain in left shoulder: Secondary | ICD-10-CM | POA: Diagnosis not present

## 2017-07-05 DIAGNOSIS — K209 Esophagitis, unspecified: Secondary | ICD-10-CM | POA: Diagnosis not present

## 2017-07-05 DIAGNOSIS — M5137 Other intervertebral disc degeneration, lumbosacral region: Secondary | ICD-10-CM | POA: Diagnosis not present

## 2017-07-05 DIAGNOSIS — M7062 Trochanteric bursitis, left hip: Secondary | ICD-10-CM | POA: Diagnosis not present

## 2017-07-05 DIAGNOSIS — M7061 Trochanteric bursitis, right hip: Secondary | ICD-10-CM | POA: Diagnosis not present

## 2017-07-05 DIAGNOSIS — G8929 Other chronic pain: Secondary | ICD-10-CM | POA: Diagnosis not present

## 2017-07-05 DIAGNOSIS — G893 Neoplasm related pain (acute) (chronic): Secondary | ICD-10-CM | POA: Diagnosis not present

## 2017-07-05 DIAGNOSIS — I25119 Atherosclerotic heart disease of native coronary artery with unspecified angina pectoris: Secondary | ICD-10-CM | POA: Diagnosis not present

## 2017-07-05 DIAGNOSIS — M5441 Lumbago with sciatica, right side: Secondary | ICD-10-CM | POA: Diagnosis not present

## 2017-07-12 DIAGNOSIS — Z7689 Persons encountering health services in other specified circumstances: Secondary | ICD-10-CM | POA: Diagnosis not present

## 2017-07-12 DIAGNOSIS — R0982 Postnasal drip: Secondary | ICD-10-CM | POA: Diagnosis not present

## 2017-07-12 DIAGNOSIS — R10826 Epigastric rebound abdominal tenderness: Secondary | ICD-10-CM | POA: Diagnosis not present

## 2017-07-12 DIAGNOSIS — F339 Major depressive disorder, recurrent, unspecified: Secondary | ICD-10-CM | POA: Diagnosis not present

## 2017-07-12 DIAGNOSIS — E039 Hypothyroidism, unspecified: Secondary | ICD-10-CM | POA: Diagnosis not present

## 2017-07-12 DIAGNOSIS — E782 Mixed hyperlipidemia: Secondary | ICD-10-CM | POA: Diagnosis not present

## 2017-07-12 DIAGNOSIS — K209 Esophagitis, unspecified: Secondary | ICD-10-CM | POA: Diagnosis not present

## 2017-07-23 DIAGNOSIS — M47812 Spondylosis without myelopathy or radiculopathy, cervical region: Secondary | ICD-10-CM | POA: Diagnosis not present

## 2017-07-23 DIAGNOSIS — M5126 Other intervertebral disc displacement, lumbar region: Secondary | ICD-10-CM | POA: Diagnosis not present

## 2017-07-23 DIAGNOSIS — M47816 Spondylosis without myelopathy or radiculopathy, lumbar region: Secondary | ICD-10-CM | POA: Diagnosis not present

## 2017-09-01 DIAGNOSIS — L570 Actinic keratosis: Secondary | ICD-10-CM | POA: Diagnosis not present

## 2017-09-01 DIAGNOSIS — B359 Dermatophytosis, unspecified: Secondary | ICD-10-CM | POA: Diagnosis not present

## 2017-09-09 DIAGNOSIS — L570 Actinic keratosis: Secondary | ICD-10-CM | POA: Diagnosis not present

## 2017-09-09 DIAGNOSIS — L920 Granuloma annulare: Secondary | ICD-10-CM | POA: Diagnosis not present

## 2017-09-09 DIAGNOSIS — L821 Other seborrheic keratosis: Secondary | ICD-10-CM | POA: Diagnosis not present

## 2017-10-05 DIAGNOSIS — C210 Malignant neoplasm of anus, unspecified: Secondary | ICD-10-CM | POA: Diagnosis not present

## 2018-01-20 ENCOUNTER — Other Ambulatory Visit: Payer: Self-pay

## 2018-01-20 ENCOUNTER — Ambulatory Visit (INDEPENDENT_AMBULATORY_CARE_PROVIDER_SITE_OTHER): Payer: Medicare Other | Admitting: Nurse Practitioner

## 2018-01-20 ENCOUNTER — Encounter: Payer: Self-pay | Admitting: Nurse Practitioner

## 2018-01-20 VITALS — BP 113/66 | HR 92 | Temp 98.2°F | Ht 64.0 in | Wt 195.0 lb

## 2018-01-20 DIAGNOSIS — E559 Vitamin D deficiency, unspecified: Secondary | ICD-10-CM | POA: Diagnosis not present

## 2018-01-20 DIAGNOSIS — M7061 Trochanteric bursitis, right hip: Secondary | ICD-10-CM | POA: Diagnosis not present

## 2018-01-20 DIAGNOSIS — Z7689 Persons encountering health services in other specified circumstances: Secondary | ICD-10-CM

## 2018-01-20 DIAGNOSIS — E782 Mixed hyperlipidemia: Secondary | ICD-10-CM | POA: Diagnosis not present

## 2018-01-20 DIAGNOSIS — M545 Low back pain: Secondary | ICD-10-CM | POA: Diagnosis not present

## 2018-01-20 DIAGNOSIS — G8929 Other chronic pain: Secondary | ICD-10-CM | POA: Diagnosis not present

## 2018-01-20 DIAGNOSIS — E039 Hypothyroidism, unspecified: Secondary | ICD-10-CM

## 2018-01-20 DIAGNOSIS — F324 Major depressive disorder, single episode, in partial remission: Secondary | ICD-10-CM

## 2018-01-20 DIAGNOSIS — R109 Unspecified abdominal pain: Secondary | ICD-10-CM

## 2018-01-20 DIAGNOSIS — K625 Hemorrhage of anus and rectum: Secondary | ICD-10-CM

## 2018-01-20 DIAGNOSIS — G893 Neoplasm related pain (acute) (chronic): Secondary | ICD-10-CM

## 2018-01-20 DIAGNOSIS — M7062 Trochanteric bursitis, left hip: Secondary | ICD-10-CM | POA: Diagnosis not present

## 2018-01-20 MED ORDER — PREDNISONE 10 MG PO TABS: ORAL_TABLET | ORAL | 0 refills | Status: DC

## 2018-01-20 MED ORDER — LEVOTHYROXINE SODIUM 75 MCG PO TABS: 75.0000 ug | ORAL_TABLET | Freq: Every day | ORAL | 5 refills | Status: DC

## 2018-01-20 MED ORDER — BUPROPION HCL ER (XL) 150 MG PO TB24: 150.0000 mg | ORAL_TABLET | Freq: Every day | ORAL | 5 refills | Status: DC

## 2018-01-20 MED ORDER — EZETIMIBE 10 MG PO TABS: 5.0000 mg | ORAL_TABLET | ORAL | 11 refills | Status: DC

## 2018-01-20 MED ORDER — ROSUVASTATIN CALCIUM 5 MG PO TABS: 5.0000 mg | ORAL_TABLET | ORAL | 5 refills | Status: DC

## 2018-01-20 MED ORDER — GABAPENTIN 100 MG PO CAPS: ORAL_CAPSULE | ORAL | 5 refills | Status: DC

## 2018-01-20 MED ORDER — TRAMADOL HCL 50 MG PO TABS: 100.0000 mg | ORAL_TABLET | Freq: Three times a day (TID) | ORAL | 0 refills | Status: AC

## 2018-01-20 NOTE — Progress Notes (Signed)
Subjective:    Patient ID: Brenda Grimes, female    DOB: 03-15-1966, 52 y.o.   MRN: [id number]  Brenda Grimes is a 52 y.o. female presenting on 01/20/2018 for Establish Care (bilateral hip pain , normal get injections in bursa by orthopedic ) and Obesity (pt want to work on weight management. She feels like her weight is possible causing the hip pain )   HPI Establish Care New Provider Pt last seen by PCP at [facility] and [facility] over last 2-3 years.  Obtain records from [place].   Bursitis Has had frequent bursitis.  Last time she had injections, she was referred to orthopedic surgeon.  Has had injections "lots" of times, at least 5 times.  More frequently since cancer treatments.  Worse on R hip.  Is not preventing movement, but is having more pain and wakes her during sleep.  Is using lidocaine patch for pain, but not resolving pain.  Swelling is not improving.  Anal Cancer history: Patient has prolonged abdominal pain, states it is always improved with Tramadol in past.  Past Medical History:  Diagnosis Date  . Arthritis   . Cancer ([place])    anus cancer  . Colon polyp   . Depression   . Hyperlipidemia   . MI (myocardial infarction) ([place])   . Thyroid disease    Past Surgical History:  Procedure Laterality Date  . CARDIAC SURGERY    . TUBAL LIGATION     Social History   Socioeconomic History  . Marital status: Married    Spouse name: Not on file  . Number of children: Not on file  . Years of education: Not on file  . Highest education level: Not on file  Occupational History  . Not on file  Social Needs  . Financial resource strain: Not on file  . Food insecurity:    Worry: Not on file    Inability: Not on file  . Transportation needs:    Medical: Not on file    Non-medical: Not on file  Tobacco Use  . Smoking status: Former Smoker    Years: 30.00    Last attempt to quit: 01/21/2007    Years since quitting: 11.0  . Smokeless tobacco: Never Used    Substance and Sexual Activity  . Alcohol use: No    Frequency: Never  . Drug use: No  . Sexual activity: Not on file  Lifestyle  . Physical activity:    Days per week: Not on file    Minutes per session: Not on file  . Stress: Not on file  Relationships  . Social connections:    Talks on phone: Not on file    Gets together: Not on file    Attends religious service: Not on file    Active member of club or organization: Not on file    Attends meetings of clubs or organizations: Not on file    Relationship status: Not on file  . Intimate partner violence:    Fear of current or ex partner: Not on file    Emotionally abused: Not on file    Physically abused: Not on file    Forced sexual activity: Not on file  Other Topics Concern  . Not on file  Social History Narrative  . Not on file   Family History  Problem Relation Age of Onset  . Alzheimer's disease Mother   . Stroke Brother    Current Outpatient Medications on File Prior to Visit  Medication Sig  . aspirin 81 MG chewable tablet Chew by mouth.  Marland Kitchen buPROPion (WELLBUTRIN XL) 150 MG 24 hr tablet Take by mouth.  . ezetimibe (ZETIA) 10 MG tablet Take by mouth.  . fluticasone (FLONASE) 50 MCG/ACT nasal spray 1 spray by Each Nare route daily.  Marland Kitchen gabapentin (NEURONTIN) 100 MG capsule Take by mouth.  . levothyroxine (SYNTHROID, LEVOTHROID) 75 MCG tablet Take by mouth.  . naproxen (NAPROSYN) 500 MG tablet Take by mouth.  . rosuvastatin (CRESTOR) 5 MG tablet Take by mouth.  . traMADol (ULTRAM) 50 MG tablet Take by mouth.  . nitroGLYCERIN (NITROSTAT) 0.4 MG SL tablet Place 1 tablet (0.4 mg total) under the tongue every 5 (five) minutes as needed for chest pain. (Patient not taking: Reported on 01/20/2018)   No current facility-administered medications on file prior to visit.     Review of Systems Per HPI unless specifically indicated above     Objective:    BP 113/66 (BP Location: Left Arm, Patient Position: Sitting, Cuff  Size: Normal)   Pulse 92   Temp 98.2 F (36.8 C) (Oral)   Ht 5\' 4"  (1.626 m)   Wt 195 lb (88.5 kg)   SpO2 99%   BMI 33.47 kg/m   Wt Readings from Last 3 Encounters:  01/20/18 195 lb (88.5 kg)  03/23/17 190 lb (86.2 kg)    Physical Exam  Constitutional: She is oriented to person, place, and time. She appears well-developed and well-nourished. No distress.  HENT:  Head: Normocephalic and atraumatic.  Musculoskeletal:       Right hip: She exhibits tenderness (lateral hip).       Left hip: She exhibits tenderness (lateral hip).  Neurological: She is alert and oriented to person, place, and time.  Skin: Skin is warm and dry.  Psychiatric: She has a normal mood and affect. Her behavior is normal.  Vitals reviewed.    Results for orders placed or performed during the hospital encounter of 16/10/96  Basic metabolic panel  Result Value Ref Range   Sodium 139 135 - 145 mmol/L   Potassium 3.1 (L) 3.5 - 5.1 mmol/L   Chloride 105 101 - 111 mmol/L   CO2 27 22 - 32 mmol/L   Glucose, Bld 95 65 - 99 mg/dL   BUN 16 6 - 20 mg/dL   Creatinine, Ser 0.72 0.44 - 1.00 mg/dL   Calcium 8.8 (L) 8.9 - 10.3 mg/dL   GFR calc non Af Amer >60 >60 mL/min   GFR calc Af Amer >60 >60 mL/min   Anion gap 7 5 - 15  CBC  Result Value Ref Range   WBC 2.7 (L) 3.6 - 11.0 K/uL   RBC 3.94 3.80 - 5.20 MIL/uL   Hemoglobin 13.1 12.0 - 16.0 g/dL   HCT 38.8 35.0 - 47.0 %   MCV 98.5 80.0 - 100.0 fL   MCH 33.3 26.0 - 34.0 pg   MCHC 33.8 32.0 - 36.0 g/dL   RDW 14.1 11.5 - 14.5 %   Platelets 185 150 - 440 K/uL  Troponin I  Result Value Ref Range   Troponin I <0.03 <0.03 ng/mL  Troponin I  Result Value Ref Range   Troponin I <0.03 <0.03 ng/mL      Assessment & Plan:   Problem List Items Addressed This Visit    None    Visit Diagnoses    Trochanteric bursitis of right hip    -  Primary Patient with acute on chronic pain right  hip.  Has had previous recommendation for removal of the bursa.  Would like  orthopedic surgery referral.  Referral placed.  Follow-up PRN.   Relevant Orders   Ambulatory referral to Orthopedic Surgery   Encounter to establish care     Previous PCP was at [facility].  Records are reviewed in care everywhere.  Past medical, family, and surgical history reviewed w/ pt.     Trochanteric bursitis of both hips     Pain likely chronic with acute exacerbation.  Plan:  1. Treat with OTC pain meds (acetaminophen and ibuprofen with ibuprofen only after prednisone).  Discussed alternate dosing and max dosing. - Start prednisone taper over 7 days Day 1-2: 60 mg, Day 3: 50 mg, Day 4: 40 mg; Day 5: 30 mg; Day 6 20 mg; Day 7: 10 mg then stop. 2. Apply heat and/or ice to affected area. 3. May also apply a muscle rub with lidocaine or lidocaine patch after heat or ice. 4.  Continue tramadol with reduction to 50 mg per dose. 5. Follow up prn    Mixed hyperlipidemia     status unknown.  Patient due for labs.  Continue rosuvastatin.  Follow-up 3 months   Relevant Medications   aspirin 81 MG chewable tablet   rosuvastatin (CRESTOR) 5 MG tablet   Other Relevant Orders   Lipid panel (Completed)   COMPLETE METABOLIC PANEL WITH GFR (Completed)   VITAMIN D 25 Hydroxy (Vit-D Deficiency, Fractures) (Completed)   Acquired hypothyroidism     status unknown.  Patient due for labs.  Continue levothyroxine.  Follow-up 3 months   Relevant Medications   levothyroxine (SYNTHROID, LEVOTHROID) 75 MCG tablet   Other Relevant Orders   TSH + free T4 (Completed)   Anal bleeding     Patient with ongoing uterine bleeding.  No recent CBC.  Recheck with labs.  Follow-up PRN.   Relevant Orders   CBC with Differential/Platelet (Completed)   Cancer associated pain       Stomach pain     Patient has stomach pain related to cancer treatments.  Is taking tramadol with moderate relief.  Continue, but try to reduce to 1 tablet 3 times daily as needed moderate pain.  You prescription provided only for 5  days.    Vitamin D deficiency     status unknown.  Patient due for labs.  Continue OTC vitamin D3.  Follow-up 3 months   Relevant Orders   VITAMIN D 25 Hydroxy (Vit-D Deficiency, Fractures) (Completed)   Chronic midline low back pain without sciatica    see treatment for bilateral trochanteric bursitis.   Relevant Medications   naproxen (NAPROSYN) 500 MG tablet   aspirin 81 MG chewable tablet   Major depressive disorder with single episode, in partial remission ([place])     Patient reports feeling well on current dose of Wellbutrin.  Refills provided.  Follow-up 3 months.   Relevant Medications   buPROPion (WELLBUTRIN XL) 150 MG 24 hr tablet      Meds ordered this encounter  Medications  . traMADol (ULTRAM) 50 MG tablet    Sig: Take 2 tablets (100 mg total) by mouth 3 (three) times daily for 5 days.    Dispense:  30 tablet    Refill:  0    Order Specific Question:   Supervising Provider    Answer:   Olin Hauser [[id number]]  . buPROPion (WELLBUTRIN XL) 150 MG 24 hr tablet    Sig: Take 1 tablet (150 mg  total) by mouth daily.    Dispense:  30 tablet    Refill:  5    Order Specific Question:   Supervising Provider    Answer:   Olin Hauser [[id number]]  .  ezetimibe (ZETIA) 10 MG tablet    Sig: Take 0.5 tablets (5 mg total) by mouth once a week.    Dispense:  3 tablet    Refill:  11    Order Specific Question:   Supervising Provider    Answer:   Olin Hauser [[id number]]  . rosuvastatin (CRESTOR) 5 MG tablet    Sig: Take 1 tablet (5 mg total) by mouth every Monday, Wednesday, and Friday.    Dispense:  12 tablet    Refill:  5    Order Specific Question:   Supervising Provider    Answer:   Olin Hauser [[id number]]  . levothyroxine (SYNTHROID, LEVOTHROID) 75 MCG tablet    Sig: Take 1 tablet (75 mcg total) by mouth daily before breakfast.    Dispense:  30 tablet    Refill:  5    Order Specific Question:   Supervising Provider    Answer:   Olin Hauser [[id number]]  .  gabapentin (NEURONTIN) 100 MG capsule    Sig: Take 1 capsule in am, Take 1 capsule in afternoon, Take 3 capsules at bedtime.    Dispense:  150 capsule    Refill:  5    Order Specific Question:   Supervising Provider    Answer:   Olin Hauser [[id number]]  .  predniSONE (DELTASONE) 10 MG tablet    Sig: Day 1-2 take 6 pills. Day 3 take 5 pills then reduce by 1 pill each day.    Dispense:  27 tablet    Refill:  0    Order Specific Question:   Supervising Provider    Answer:   Olin Hauser [[id number]]   Follow up plan: Return in about 2 weeks (around 02/03/2018) for bursitis, hip/back pain.  Cassell Smiles, DNP, AGPCNP-BC Adult Gerontology Primary Care Nurse Practitioner [facility] 01/20/2018, 10:56 AM

## 2018-01-20 NOTE — Patient Instructions (Addendum)
Brenda Grimes,   Thank you for coming in to clinic today.  1. Try to reduce your tramadol doses to 1 tab per dose.  Please schedule a follow-up appointment with Cassell Smiles, AGNP. Return in about 2 weeks (around 02/03/2018) for bursitis, hip/back pain.  If you have any other questions or concerns, please feel free to call the clinic or send a message through [place]. You may also schedule an earlier appointment if necessary.  You will receive a survey after today's visit either digitally by e-mail or paper by [employer]. Your experiences and feedback matter to [place].  Please respond so we know how we are doing as we provide care for you.   Cassell Smiles, DNP, AGNP-BC Adult Gerontology Nurse Practitioner [place]

## 2018-01-24 DIAGNOSIS — E039 Hypothyroidism, unspecified: Secondary | ICD-10-CM | POA: Diagnosis not present

## 2018-01-24 DIAGNOSIS — K625 Hemorrhage of anus and rectum: Secondary | ICD-10-CM | POA: Diagnosis not present

## 2018-01-24 DIAGNOSIS — E559 Vitamin D deficiency, unspecified: Secondary | ICD-10-CM | POA: Diagnosis not present

## 2018-01-24 DIAGNOSIS — E782 Mixed hyperlipidemia: Secondary | ICD-10-CM | POA: Diagnosis not present

## 2018-01-25 LAB — VITAMIN D 25 HYDROXY (VIT D DEFICIENCY, FRACTURES): Vit D, 25-Hydroxy: 28 ng/mL — ABNORMAL LOW (ref 30–100)

## 2018-01-25 LAB — CBC WITH DIFFERENTIAL/PLATELET
Basophils Absolute: 60 cells/uL (ref 0–200)
Basophils Relative: 1.2 %
Eosinophils Absolute: 30 cells/uL (ref 15–500)
Eosinophils Relative: 0.6 %
HCT: 38.8 % (ref 35.0–45.0)
Hemoglobin: 12.7 g/dL (ref 11.7–15.5)
Lymphs Abs: 1410 cells/uL (ref 850–3900)
MCH: 32.6 pg (ref 27.0–33.0)
MCHC: 32.7 g/dL (ref 32.0–36.0)
MCV: 99.7 fL (ref 80.0–100.0)
MPV: 8.9 fL (ref 7.5–12.5)
Monocytes Relative: 8.9 %
Neutro Abs: 3055 cells/uL (ref 1500–7800)
Neutrophils Relative %: 61.1 %
Platelets: 200 10*3/uL (ref 140–400)
RBC: 3.89 10*6/uL (ref 3.80–5.10)
RDW: 13 % (ref 11.0–15.0)
Total Lymphocyte: 28.2 %
WBC mixed population: 445 cells/uL (ref 200–950)
WBC: 5 10*3/uL (ref 3.8–10.8)

## 2018-01-25 LAB — COMPLETE METABOLIC PANEL WITH GFR
AG Ratio: 2 (calc) (ref 1.0–2.5)
ALT: 13 U/L (ref 6–29)
AST: 18 U/L (ref 10–35)
Albumin: 4.2 g/dL (ref 3.6–5.1)
Alkaline phosphatase (APISO): 76 U/L (ref 33–130)
BUN: 19 mg/dL (ref 7–25)
CO2: 30 mmol/L (ref 20–32)
Calcium: 9.2 mg/dL (ref 8.6–10.4)
Chloride: 104 mmol/L (ref 98–110)
Creat: 0.88 mg/dL (ref 0.50–1.05)
GFR, Est African American: 87 mL/min/{1.73_m2} (ref >=60)
GFR, Est Non African American: 75 mL/min/{1.73_m2} (ref >=60)
Globulin: 2.1 g/dL (calc) (ref 1.9–3.7)
Glucose, Bld: 88 mg/dL (ref 65–99)
Potassium: 3.8 mmol/L (ref 3.5–5.3)
Sodium: 143 mmol/L (ref 135–146)
Total Bilirubin: 0.5 mg/dL (ref 0.2–1.2)
Total Protein: 6.3 g/dL (ref 6.1–8.1)

## 2018-01-25 LAB — LIPID PANEL
Cholesterol: 176 mg/dL (ref <200)
HDL: 64 mg/dL (ref >50)
LDL Cholesterol (Calc): 78 mg/dL (calc)
Non-HDL Cholesterol (Calc): 112 mg/dL (calc) (ref <130)
Total CHOL/HDL Ratio: 2.8 (calc) (ref <5.0)
Triglycerides: 245 mg/dL — ABNORMAL HIGH (ref <150)

## 2018-01-25 LAB — TSH+FREE T4: TSH W/REFLEX TO FT4: 3.47 mIU/L

## 2018-01-31 ENCOUNTER — Telehealth: Payer: Self-pay | Admitting: Nurse Practitioner

## 2018-01-31 ENCOUNTER — Other Ambulatory Visit: Payer: Self-pay

## 2018-01-31 ENCOUNTER — Ambulatory Visit: Payer: Medicare Other | Admitting: Nurse Practitioner

## 2018-01-31 ENCOUNTER — Encounter: Payer: Self-pay | Admitting: Nurse Practitioner

## 2018-01-31 ENCOUNTER — Ambulatory Visit (INDEPENDENT_AMBULATORY_CARE_PROVIDER_SITE_OTHER): Payer: Medicare Other | Admitting: Nurse Practitioner

## 2018-01-31 VITALS — BP 110/73 | HR 96 | Temp 98.7°F | Ht 64.0 in | Wt 195.2 lb

## 2018-01-31 DIAGNOSIS — M7918 Myalgia, other site: Secondary | ICD-10-CM

## 2018-01-31 DIAGNOSIS — M25551 Pain in right hip: Secondary | ICD-10-CM

## 2018-01-31 DIAGNOSIS — M545 Low back pain, unspecified: Secondary | ICD-10-CM

## 2018-01-31 DIAGNOSIS — Z23 Encounter for immunization: Secondary | ICD-10-CM | POA: Diagnosis not present

## 2018-01-31 DIAGNOSIS — M7061 Trochanteric bursitis, right hip: Secondary | ICD-10-CM | POA: Diagnosis not present

## 2018-01-31 DIAGNOSIS — G8929 Other chronic pain: Secondary | ICD-10-CM | POA: Diagnosis not present

## 2018-01-31 DIAGNOSIS — M2550 Pain in unspecified joint: Secondary | ICD-10-CM

## 2018-01-31 DIAGNOSIS — G894 Chronic pain syndrome: Secondary | ICD-10-CM

## 2018-01-31 MED ORDER — GABAPENTIN 300 MG PO CAPS: ORAL_CAPSULE | ORAL | 3 refills | Status: DC

## 2018-01-31 MED ORDER — TRAMADOL HCL 50 MG PO TABS: 100.0000 mg | ORAL_TABLET | Freq: Three times a day (TID) | ORAL | 2 refills | Status: DC | PRN

## 2018-01-31 NOTE — Telephone Encounter (Signed)
Medication request.

## 2018-01-31 NOTE — Telephone Encounter (Signed)
Sent as I did see patient today for additional evaluation of pain. See OV note.

## 2018-01-31 NOTE — Progress Notes (Signed)
Subjective:    Patient ID: Brenda Grimes, female    DOB: [birth year], 52 y.o.   MRN: [id number]  Brenda Grimes is a 52 y.o. female presenting on 01/31/2018 for Hip Pain (bursitis Right hip )   HPI  Hip Pain, R, polyarthralgias, myofascial pain Patient returns for recurrent, not improved R hip pain after last visit.  She is particularly interested in exploring the possible fibromyalgia diagnosis.  She is less concerned for hip as she is visiting with orthpedics today.  Tramadol continues to provide some relief of pain.  Needs refill of medication after initial 5 day supply.   - Patient regularly has fatigue, especially after over expenditure of energy on good days. - Patient had some relief of pain after prednisone course. - Regularly has more pain in early am, then later in evening.   - gabapentin is helping pain moderately, does not cause drowsiness. - Is having multiple trigger points of pain in muscles, very tender to touch.  Dyspareunia Slippery stuff with therapy.  Pelvic floor PT completed.  As soon as she is finished therapy, pain resumes.  Patient feels she has increased muscle tone, less vaginal dryness contributing to dyspareunia.  Has been > 1 year since participating in pelvic floor physical therapy.  Social History   Tobacco Use  . Smoking status: Former Smoker    Years: 30.00    Last attempt to quit: 01/21/2007    Years since quitting: 11.0  . Smokeless tobacco: Never Used  Substance Use Topics  . Alcohol use: No    Frequency: Never  . Drug use: No    Review of Systems Per HPI unless specifically indicated above     Objective:    BP 110/73 (BP Location: Left Arm, Patient Position: Sitting, Cuff Size: Normal)   Pulse 96   Temp 98.7 F (37.1 C) (Oral)   Ht 5' 4"  (1.626 m)   Wt 195 lb 3.2 oz (88.5 kg)   BMI 33.51 kg/m   Wt Readings from Last 3 Encounters:  01/31/18 195 lb 3.2 oz (88.5 kg)  01/20/18 195 lb (88.5 kg)  03/23/17 190 lb (86.2 kg)    Physical  Exam  Constitutional: She is oriented to person, place, and time. She appears well-developed and well-nourished. No distress.  HENT:  Head: Normocephalic and atraumatic.  Eyes: Pupils are equal, round, and reactive to light. EOM are normal.  Cardiovascular: Normal rate, regular rhythm, S1 normal, S2 normal, normal heart sounds and intact distal pulses.  Pulmonary/Chest: Effort normal and breath sounds normal. No respiratory distress.  Neurological: She is alert and oriented to person, place, and time.  Skin: Skin is warm and dry. Capillary refill takes less than 2 seconds.     Psychiatric: She has a normal mood and affect. Her behavior is normal.  Vitals reviewed.   Results for orders placed or performed in visit on 01/20/18  Lipid panel  Result Value Ref Range   Cholesterol 176 <200 mg/dL   HDL 64 >50 mg/dL   Triglycerides 245 (H) <150 mg/dL   LDL Cholesterol (Calc) 78 mg/dL (calc)   Total CHOL/HDL Ratio 2.8 <5.0 (calc)   Non-HDL Cholesterol (Calc) 112 <130 mg/dL (calc)  TSH + free T4  Result Value Ref Range   TSH W/REFLEX TO FT4 3.47 mIU/L  CBC with Differential/Platelet  Result Value Ref Range   WBC 5.0 3.8 - 10.8 Thousand/uL   RBC 3.89 3.80 - 5.10 Million/uL   Hemoglobin 12.7 11.7 - 15.5 g/dL  HCT 38.8 35.0 - 45.0 %   MCV 99.7 80.0 - 100.0 fL   MCH 32.6 27.0 - 33.0 pg   MCHC 32.7 32.0 - 36.0 g/dL   RDW 13.0 11.0 - 15.0 %   Platelets 200 140 - 400 Thousand/uL   MPV 8.9 7.5 - 12.5 fL   Neutro Abs 3,055 1,500 - 7,800 cells/uL   Lymphs Abs 1,410 850 - 3,900 cells/uL   WBC mixed population 445 200 - 950 cells/uL   Eosinophils Absolute 30 15 - 500 cells/uL   Basophils Absolute 60 0 - 200 cells/uL   Neutrophils Relative % 61.1 %   Total Lymphocyte 28.2 %   Monocytes Relative 8.9 %   Eosinophils Relative 0.6 %   Basophils Relative 1.2 %  COMPLETE METABOLIC PANEL WITH GFR  Result Value Ref Range   Glucose, Bld 88 65 - 99 mg/dL   BUN 19 7 - 25 mg/dL   Creat 0.88 0.50 -  1.05 mg/dL   GFR, Est Non African American 75 > OR = 60 mL/min/1.48m   GFR, Est African American 87 > OR = 60 mL/min/1.71m  BUN/Creatinine Ratio NOT APPLICABLE 6 - 22 (calc)   Sodium 143 135 - 146 mmol/L   Potassium 3.8 3.5 - 5.3 mmol/L   Chloride 104 98 - 110 mmol/L   CO2 30 20 - 32 mmol/L   Calcium 9.2 8.6 - 10.4 mg/dL   Total Protein 6.3 6.1 - 8.1 g/dL   Albumin 4.2 3.6 - 5.1 g/dL   Globulin 2.1 1.9 - 3.7 g/dL (calc)   AG Ratio 2.0 1.0 - 2.5 (calc)   Total Bilirubin 0.5 0.2 - 1.2 mg/dL   Alkaline phosphatase (APISO) 76 33 - 130 U/L   AST 18 10 - 35 U/L   ALT 13 6 - 29 U/L  VITAMIN D 25 Hydroxy (Vit-D Deficiency, Fractures)  Result Value Ref Range   Vit D, 25-Hydroxy 28 (L) 30 - 100 ng/mL      Assessment & Plan:   Problem List Items Addressed This Visit    None    Visit Diagnoses    Right hip pain    -  Primary   Polyarthralgia       Relevant Orders   C-reactive protein   Sed Rate (ESR)   Rheumatoid Factor   ANA Screen,IFA,Reflex Titer/Pattern,Reflex Mplx 11 Ab Cascade with IdentRA   Myofascial pain       Relevant Orders   C-reactive protein   Sed Rate (ESR)   Rheumatoid Factor   ANA Screen,IFA,Reflex Titer/Pattern,Reflex Mplx 11 Ab Cascade with IdentRA   Chronic midline low back pain without sciatica       Relevant Medications   gabapentin (NEURONTIN) 300 MG capsule   Need for immunization against influenza       Relevant Orders   Flu Vaccine QUAD 6+ mos PF IM (Fluarix Quad PF)    # Chronic pain with polyarthralgias, myalgias Patient without known diagnosis for pain.  No prior inflammatory, RA, autoimmune workup for possible cause. - Increase gabapentin to 300 in am, 300 in pm, 600 at bedtime - Continue tramadol.  Rx provided - Labs today for evaluation of inflammatory markers, possible pain cause.   - Discussed that fibromyalgia is dx of exclusion. - Followup 3 months   # Flu vaccine Pt < age 93 Needs annual influenza vaccine.  Plan: 1. Administer  Quad flu vaccine.     Follow up plan: Return in about 3 months (around  05/02/2018) for myofascial pain.  Cassell Smiles, DNP, AGPCNP-BC Adult Gerontology Primary Care Nurse Practitioner [facility] 01/31/2018, 10:16 AM

## 2018-01-31 NOTE — Patient Instructions (Addendum)
Brenda Grimes,   Thank you for coming in to clinic today.   1. Continue with your orthopedic referral today.  2. I am sending labs to determine possible diagnosis for your pain. - INCREASE gabapentin to 300 mg in am, in afternoon, and 600 mg at bedtime.  New prescription sent for 300 mg capsules.  3. Pelvic floor physical therapy - [facility] rehab will call you to schedule.   Please schedule a follow-up appointment with Cassell Smiles, AGNP. Return in about 3 months (around 05/02/2018) for myofascial pain.  If you have any other questions or concerns, please feel free to call the clinic or send a message through [place]. You may also schedule an earlier appointment if necessary.  You will receive a survey after today's visit either digitally by e-mail or paper by [employer]. Your experiences and feedback matter to [place].  Please respond so we know how we are doing as we provide care for you.   Cassell Smiles, DNP, AGNP-BC Adult Gerontology Nurse Practitioner [facility], [facility]    Myofascial Pain Syndrome and Fibromyalgia Myofascial pain syndrome and fibromyalgia are both pain disorders. This pain may be felt mainly in your muscles.  Myofascial pain syndrome: ? Always has trigger points or tender points in the muscle that will cause pain when pressed. The pain may come and go. ? Usually affects your neck, upper back, and shoulder areas. The pain often radiates into your arms and hands.  Fibromyalgia: ? Has muscle pains and tenderness that come and go. ? Is often associated with fatigue and sleep disturbances. ? Has trigger points. ? Tends to be long-lasting (chronic), but is not life-threatening.  Fibromyalgia and myofascial pain are not the same. However, they often occur together. If you have both conditions, each can make the other worse. Both are common and can cause enough pain and fatigue to make day-to-day activities difficult. What are the causes? The exact causes  of fibromyalgia and myofascial pain are not known. People with certain gene types may be more likely to develop fibromyalgia. Some factors can be triggers for both conditions, such as:  Spine disorders.  Arthritis.  Severe injury (trauma) and other physical stressors.  Being under a lot of stress.  A medical illness.  What are the signs or symptoms? Fibromyalgia The main symptom of fibromyalgia is widespread pain and tenderness in your muscles. This can vary over time. Pain is sometimes described as stabbing, shooting, or burning. You may have tingling or numbness, too. You may also have sleep problems and fatigue. You may wake up feeling tired and groggy (fibro fog). Other symptoms may include:  Bowel and bladder problems.  Headaches.  Visual problems.  Problems with odors and noises.  Depression or mood changes.  Painful menstrual periods (dysmenorrhea).  Dry skin or eyes.  Myofascial pain syndrome Symptoms of myofascial pain syndrome include:  Tight, ropy bands of muscle.  Uncomfortable sensations in muscular areas, such as: ? Aching. ? Cramping. ? Burning. ? Numbness. ? Tingling. ? Muscle weakness.  Trouble moving certain muscles freely (range of motion).  How is this diagnosed? There are no specific tests to diagnose fibromyalgia or myofascial pain syndrome. Both can be hard to diagnose because their symptoms are common in many other conditions. Your health care provider may suspect one or both of these conditions based on your symptoms and medical history. Your health care provider will also do a physical exam. The key to diagnosing fibromyalgia is having pain, fatigue, and  other symptoms for more than three months that cannot be explained by another condition. The key to diagnosing myofascial pain syndrome is finding trigger points in muscles that are tender and cause pain elsewhere in your body (referred pain). How is this treated? Treating fibromyalgia and  myofascial pain often requires a team of health care providers. This usually starts with your primary provider and a physical therapist. You may also find it helpful to work with alternative health care providers, such as massage therapists or acupuncturists. Treatment for fibromyalgia may include medicines. This may include nonsteroidal anti-inflammatory drugs (NSAIDs), along with other medicines. Treatment for myofascial pain may also include:  NSAIDs.  Cooling and stretching of muscles.  Trigger point injections.  Sound wave (ultrasound) treatments to stimulate muscles.  Follow these instructions at home:  Take medicines only as directed by your health care provider.  Exercise as directed by your health care provider or physical therapist.  Try to avoid stressful situations.  Practice relaxation techniques to control your stress. You may want to try: ? Biofeedback. ? Visual imagery. ? Hypnosis. ? Muscle relaxation. ? Yoga. ? Meditation.  Talk to your health care provider about alternative treatments, such as acupuncture or massage treatment.  Maintain a healthy lifestyle. This includes eating a healthy diet and getting enough sleep.  Consider joining a support group.  Do not do activities that stress or strain your muscles. That includes repetitive motions and heavy lifting. Where to find more information:  National Fibromyalgia Association: www.fmaware.[place]: www.arthritis.org  American Chronic Pain Association: [employer] Contact a health care provider if:  You have new symptoms.  Your symptoms get worse.  You have side effects from your medicines.  You have trouble sleeping.  Your condition is causing depression or anxiety. This information is not intended to replace advice given to you by your health care provider. Make sure you discuss any questions you have with your health care provider. Document  Released: 04/20/2005 Document Revised: 09/26/2015 Document Reviewed: 01/24/2014 Elsevier Interactive Patient Education  [employer].

## 2018-01-31 NOTE — Telephone Encounter (Signed)
Pt needs refill on tramadol sent to [facility] [phone]

## 2018-02-01 ENCOUNTER — Other Ambulatory Visit: Payer: Self-pay | Admitting: Nurse Practitioner

## 2018-02-01 DIAGNOSIS — G893 Neoplasm related pain (acute) (chronic): Secondary | ICD-10-CM

## 2018-02-01 DIAGNOSIS — R109 Unspecified abdominal pain: Secondary | ICD-10-CM

## 2018-02-02 ENCOUNTER — Telehealth: Payer: Self-pay | Admitting: Nurse Practitioner

## 2018-02-02 DIAGNOSIS — G894 Chronic pain syndrome: Secondary | ICD-10-CM

## 2018-02-02 MED ORDER — TRAMADOL HCL 50 MG PO TABS: 100.0000 mg | ORAL_TABLET | Freq: Three times a day (TID) | ORAL | 2 refills | Status: DC | PRN

## 2018-02-02 NOTE — Telephone Encounter (Signed)
Pt called said that medication was called into wrong drug store  Tramadol need to be called into [facility].

## 2018-02-02 NOTE — Telephone Encounter (Signed)
Please call and cancel Tramadol prescription at [facility] before new Rx will be sent to [facility].

## 2018-02-02 NOTE — Telephone Encounter (Signed)
Called and cancel the Rx for Tramadol from [place].

## 2018-02-03 LAB — SEDIMENTATION RATE: Sed Rate: 14 mm/h (ref 0–30)

## 2018-02-03 LAB — ANA SCREEN,IFA,REFLEX TITER/PATTERN,REFLEX MPLX 11 AB CASCADE
14-3-3 eta Protein: <0.2 ng/mL (ref <0.2)
Anti Nuclear Antibody(ANA): NEGATIVE
Cyclic Citrullin Peptide Ab: <16 UNITS
Rhuematoid fact SerPl-aCnc: <14 IU/mL (ref <14)

## 2018-02-03 LAB — C-REACTIVE PROTEIN: CRP: 11.1 mg/L — ABNORMAL HIGH (ref <8.0)

## 2018-02-04 ENCOUNTER — Encounter: Payer: Self-pay | Admitting: Nurse Practitioner

## 2018-02-04 DIAGNOSIS — M7061 Trochanteric bursitis, right hip: Secondary | ICD-10-CM | POA: Diagnosis not present

## 2018-02-04 DIAGNOSIS — M545 Low back pain: Secondary | ICD-10-CM | POA: Diagnosis not present

## 2018-02-04 NOTE — Telephone Encounter (Signed)
[place] message

## 2018-02-14 ENCOUNTER — Emergency Department
Admission: EM | Admit: 2018-02-14 | Discharge: 2018-02-14 | Disposition: A | Discharge status: Home / Self Care | Payer: Medicare Other | Attending: Emergency Medicine | Admitting: Emergency Medicine

## 2018-02-14 ENCOUNTER — Encounter: Payer: Self-pay | Admitting: *Deleted

## 2018-02-14 DIAGNOSIS — Z85048 Personal history of other malignant neoplasm of rectum, rectosigmoid junction, and anus: Secondary | ICD-10-CM | POA: Insufficient documentation

## 2018-02-14 DIAGNOSIS — Z7982 Long term (current) use of aspirin: Secondary | ICD-10-CM | POA: Insufficient documentation

## 2018-02-14 DIAGNOSIS — E079 Disorder of thyroid, unspecified: Secondary | ICD-10-CM | POA: Insufficient documentation

## 2018-02-14 DIAGNOSIS — Z87891 Personal history of nicotine dependence: Secondary | ICD-10-CM | POA: Diagnosis not present

## 2018-02-14 DIAGNOSIS — Z79899 Other long term (current) drug therapy: Secondary | ICD-10-CM | POA: Diagnosis not present

## 2018-02-14 DIAGNOSIS — I252 Old myocardial infarction: Secondary | ICD-10-CM | POA: Diagnosis not present

## 2018-02-14 DIAGNOSIS — M25551 Pain in right hip: Secondary | ICD-10-CM | POA: Diagnosis not present

## 2018-02-14 MED ORDER — HYDROMORPHONE HCL 1 MG/ML IJ SOLN
1.0000 mg | Freq: Once | INTRAMUSCULAR | Status: AC
  Administered 2018-02-14: 1 mg via INTRAMUSCULAR
  Filled 2018-02-14: qty 1

## 2018-02-14 MED ORDER — ORPHENADRINE CITRATE 30 MG/ML IJ SOLN
60.0000 mg | Freq: Two times a day (BID) | INTRAMUSCULAR | Status: DC
  Administered 2018-02-14: 60 mg via INTRAMUSCULAR
  Filled 2018-02-14: qty 2

## 2018-02-14 NOTE — ED Triage Notes (Signed)
Pt is here for evaluation of continued pain on right hip after receiving an injection over a week ago.  Pt gets injections for bursitis about every 6 months. Pt has a hematoma at the injection site, no warmth or redness.

## 2018-02-14 NOTE — ED Provider Notes (Signed)
[facility] Emergency Department Provider Note   ____________________________________________   First MD Initiated Contact with Patient 02/14/18 1318     (approximate)  I have reviewed the triage vital signs and the nursing notes.   HISTORY  Chief Complaint Hip Pain    HPI Brenda Grimes is a 52 y.o. female patient complain of continued right hip pain secondary to receive an injection to the trochanter bursa 1 week ago.  Patient developed hematoma at the injection site.  Patient is scheduled to see orthopedic surgeon tomorrow.  Patient the tramadol is not helping her pain which she rates as a 10/10.  Patient described the pain is "ache".   Past Medical History:  Diagnosis Date  . Arthritis   . Cancer ([place])    anus cancer  . Colon polyp   . Depression   . Hyperlipidemia   . MI (myocardial infarction) ([place])   . Thyroid disease     There are no active problems to display for this patient.   Past Surgical History:  Procedure Laterality Date  . CARDIAC SURGERY    . TUBAL LIGATION      Prior to Admission medications   Medication Sig Start Date End Date Taking? Authorizing Provider  aspirin 81 MG chewable tablet Chew by mouth. 01/24/16 10/02/29  [provider]  buPROPion (WELLBUTRIN XL) 150 MG 24 hr tablet Take 1 tablet (150 mg total) by mouth daily. 01/20/18 04/11/19  Mikey College, NP  cholecalciferol (VITAMIN D) 1000 units tablet Take 1,000 Units by mouth daily.    [provider]  Flaxseed, Linseed, (FLAX SEED OIL) 1000 MG CAPS Take by mouth.    [provider]  fluticasone (FLONASE) 50 MCG/ACT nasal spray 1 spray by Each Nare route daily. 09/25/16   [provider]  gabapentin (NEURONTIN) 300 MG capsule Take 1 capsule (300 mg) in am, Take 1 capsule (300 mg) in afternoon, Take 2 capsules (600 mg) at bedtime. 01/31/18   Mikey College, NP  levothyroxine (SYNTHROID, LEVOTHROID) 75 MCG tablet Take 1 tablet  (75 mcg total) by mouth daily before breakfast. 01/20/18   Mikey College, NP  naproxen (NAPROSYN) 500 MG tablet Take by mouth. 01/21/17   [provider]  nitroGLYCERIN (NITROSTAT) 0.4 MG SL tablet Place 1 tablet (0.4 mg total) under the tongue every 5 (five) minutes as needed for chest pain. Patient not taking: Reported on 01/31/2018 03/23/17 03/23/18  Earleen Newport, MD  rosuvastatin (CRESTOR) 5 MG tablet Take 1 tablet (5 mg total) by mouth every Monday, Wednesday, and Friday. Patient not taking: Reported on 01/31/2018 01/21/18   Mikey College, NP  traMADol (ULTRAM) 50 MG tablet Take 2 tablets (100 mg total) by mouth 3 (three) times daily as needed for moderate pain. 02/02/18 03/04/18  Mikey College, NP  vitamin B-12 (CYANOCOBALAMIN) 1000 MCG tablet Take 1,000 mcg by mouth daily.    [provider]    Allergies Sulfa antibiotics; Sulfasalazine; and Tape  Family History  Problem Relation Age of Onset  . Alzheimer's disease Mother   . CAD Father   . Stroke Brother   . Alzheimer's disease Maternal Grandmother   . Thyroid disease Brother   . Hyperlipidemia Brother   . Hyperlipidemia Brother   . Alcohol abuse Brother   . Healthy Daughter   . Healthy Son   . Heart disease Paternal Uncle   . Heart disease Paternal Grandmother   . Heart disease Paternal Grandfather   .  Thyroid disease Brother   . Cirrhosis Brother   . Healthy Son     Social History Social History   Tobacco Use  . Smoking status: Former Smoker    Years: 30.00    Last attempt to quit: 01/21/2007    Years since quitting: 11.0  . Smokeless tobacco: Never Used  Substance Use Topics  . Alcohol use: No    Frequency: Never  . Drug use: No    Review of Systems Constitutional: No fever/chills Eyes: No visual changes. ENT: No sore throat. Cardiovascular: Denies chest pain. Respiratory: Denies shortness of breath. Gastrointestinal: No abdominal pain.  No nausea, no vomiting.   No diarrhea.  No constipation. Genitourinary: Negative for dysuria. Musculoskeletal: Right hip pain. Skin: Negative for rash. Neurological: Negative for headaches, focal weakness or numbness. Psychiatric:Depression. Endocrine:Hyperlipidemia and hypothyroidism. Allergic/Immunilogical: Sulfa ____________________________________________   PHYSICAL EXAM:  VITAL SIGNS: ED Triage Vitals  Enc Vitals Group     BP 02/14/18 1146 117/73     Pulse Rate 02/14/18 1146 83     Resp 02/14/18 1146 (!) 22     Temp 02/14/18 1146 98.1 F (36.7 C)     Temp Source 02/14/18 1146 Oral     SpO2 02/14/18 1146 98 %     Weight 02/14/18 1147 195 lb (88.5 kg)     Height 02/14/18 1147 5\' 4"  (1.626 m)     Head Circumference --      Peak Flow --      Pain Score 02/14/18 1147 10     Pain Loc --      Pain Edu? --      Excl. in [place]? --    Constitutional: Alert and oriented.  Moderate.   Cardiovascular: Normal rate, regular rhythm. Grossly normal heart sounds.  Good peripheral circulation. Respiratory: Normal respiratory effort.  No retractions. Lungs CTAB. Musculoskeletal: No leg length discrepancy.  Patient is moderate guarding palpation at the greater trochanter right hip.Marland Kitchen Neurologic:  Normal speech and language. No gross focal neurologic deficits are appreciated. No gait instability. Skin:  Skin is warm, dry and intact. No rash noted.  Hematoma lateral right hip. Psychiatric: Mood and affect are normal. Speech and behavior are normal.  ____________________________________________   LABS (all labs ordered are listed, but only abnormal results are displayed)  Labs Reviewed - No data to display ____________________________________________  EKG   ____________________________________________  RADIOLOGY  ED MD interpretation:    Official radiology report(s): No results found.  ____________________________________________   PROCEDURES  Procedure(s) performed: None  Procedures  Critical  Care performed: No  ____________________________________________   INITIAL IMPRESSION / ASSESSMENT AND PLAN / ED COURSE  As part of my medical decision making, I reviewed the following data within the electronic MEDICAL RECORD NUMBER    Right hip pain secondary to bursitis.  Patient has developed a hematoma secondary to an injection to the bursa 1 week ago.  Patient will follow-up with schedule appointment with orthopedics tomorrow.  Patient given Dilaudid and Norflex prior to departure.  Patient will continue her home pain medications.      ____________________________________________   FINAL CLINICAL IMPRESSION(S) / ED DIAGNOSES  Final diagnoses:  Right hip pain     ED Discharge Orders    None       Note:  This document was prepared using Dragon voice recognition software and may include unintentional dictation errors.    Sable Feil, PA-C 02/14/18 1332    Lisa Roca, MD 02/14/18 [phone]

## 2018-02-14 NOTE — ED Notes (Signed)
See triage note  Presents with pain to right hip area   States she had an injection last week bruising noted to lateral left hip area   But states pain is moving into hip and lower abd  States she has been using heat/ice to area  But pain is getting worse

## 2018-02-14 NOTE — Discharge Instructions (Addendum)
Follow-up with scheduled orthopedic appointment tomorrow.

## 2018-02-15 DIAGNOSIS — M7061 Trochanteric bursitis, right hip: Secondary | ICD-10-CM | POA: Diagnosis not present

## 2018-03-07 DIAGNOSIS — M7061 Trochanteric bursitis, right hip: Secondary | ICD-10-CM | POA: Diagnosis not present

## 2018-03-15 DIAGNOSIS — M7061 Trochanteric bursitis, right hip: Secondary | ICD-10-CM | POA: Diagnosis not present

## 2018-03-25 ENCOUNTER — Encounter: Payer: Self-pay | Admitting: Nurse Practitioner

## 2018-05-03 ENCOUNTER — Ambulatory Visit: Payer: Medicare Other | Admitting: Nurse Practitioner

## 2018-05-05 ENCOUNTER — Encounter: Payer: Self-pay | Admitting: Nurse Practitioner

## 2018-05-05 ENCOUNTER — Ambulatory Visit (INDEPENDENT_AMBULATORY_CARE_PROVIDER_SITE_OTHER): Payer: Medicare Other | Admitting: Nurse Practitioner

## 2018-05-05 VITALS — BP 117/66 | HR 72 | Temp 98.3°F | Ht 64.0 in | Wt 195.0 lb

## 2018-05-05 DIAGNOSIS — G8929 Other chronic pain: Secondary | ICD-10-CM

## 2018-05-05 DIAGNOSIS — M791 Myalgia, unspecified site: Secondary | ICD-10-CM | POA: Insufficient documentation

## 2018-05-05 DIAGNOSIS — E039 Hypothyroidism, unspecified: Secondary | ICD-10-CM

## 2018-05-05 DIAGNOSIS — M545 Low back pain: Secondary | ICD-10-CM | POA: Diagnosis not present

## 2018-05-05 DIAGNOSIS — T466X5A Adverse effect of antihyperlipidemic and antiarteriosclerotic drugs, initial encounter: Secondary | ICD-10-CM

## 2018-05-05 DIAGNOSIS — F419 Anxiety disorder, unspecified: Secondary | ICD-10-CM

## 2018-05-05 DIAGNOSIS — E669 Obesity, unspecified: Secondary | ICD-10-CM | POA: Insufficient documentation

## 2018-05-05 MED ORDER — LEVOTHYROXINE SODIUM 75 MCG PO TABS: 75.0000 ug | ORAL_TABLET | Freq: Every day | ORAL | 1 refills | Status: DC

## 2018-05-05 MED ORDER — CITALOPRAM HYDROBROMIDE 40 MG PO TABS: 40.0000 mg | ORAL_TABLET | Freq: Every day | ORAL | 1 refills | Status: DC

## 2018-05-05 MED ORDER — GABAPENTIN 300 MG PO CAPS: ORAL_CAPSULE | ORAL | 2 refills | Status: DC

## 2018-05-05 NOTE — Patient Instructions (Addendum)
Shella Spearing,   Thank you for coming in to clinic today.  1. Continue Celexa 40 mg once daily.  2. You have a neck/trapezius muscle strain. Likely caused by tension. - Start taking Tylenol extra strength 1 to 2 tablets every 6-8 hours for aches or fever/chills for next few days as needed.  Do not take more than 3,000 mg in 24 hours from all medicines.  May take Ibuprofen as well if tolerated 200-400mg  every 8 hours as needed. May alternate tylenol and ibuprofen in same day. - Use heat and ice.  Apply this for 15 minutes at a time 6-8 times per day.   - Muscle rub with lidocaine (Aspercreme with lidocaine is odor-free), lidocaine patch, Biofreeze, or tiger balm for topical pain relief.  Avoid using this with heat and ice to avoid burns.  Please schedule a follow-up appointment with Cassell Smiles, AGNP. Return in about 6 months (around 11/03/2018) for anxiety.  If you have any other questions or concerns, please feel free to call the clinic or send a message through [place]. You may also schedule an earlier appointment if necessary.  You will receive a survey after today's visit either digitally by e-mail or paper by [employer]. Your experiences and feedback matter to [place].  Please respond so we know how we are doing as we provide care for you.   Cassell Smiles, DNP, AGNP-BC Adult Gerontology Nurse Practitioner [place]

## 2018-05-05 NOTE — Progress Notes (Signed)
Subjective:    Patient ID: Brenda Grimes, female    DOB: 30-Dec-1965, 53 y.o.   MRN: [id number]  Brenda Grimes is a 53 y.o. female presenting on 05/05/2018 for Anxiety   HPI Anxiety Patient is requesting med refills today for anxiety and is here for regular followup. - Patient is not taking Wellbutrin, but has continued taking Celexa  She only took Wellbutrin to see if vaginal dryness would improve.  Then it became less issue for sex drive and resumed Celexa.  Has better symptom control with Celexa than Wellbutrin so wants to continue. - Patient states she doesn't truly feel depressed, but is very tired due to nighttime interruptions x 3 from her mother who has Alzheimer's   - Neck pain - Worse when she gets upset.  Hyperlipidemia  Patient is not taking any Crestor due to myalgias.  Has worked to improve diet and has lost weight in last 3-4 weeks at home.  Hypothyroid Patient is taking levothyroxine 75 mcg once daily and tolerating well.  Last labs showed TSH WNL.  - She denies, fatigue, excess energy, weight changes, heart racing, heart palpitations, heat and cold intolerance, changes in hair/skin/nails, and lower leg swelling.  - She does not have any compressive symptoms to include difficulty swallowing, globus sensation, or difficulty breathing when lying flat.    Depression screen [facility] 2/9 05/05/2018 01/20/2018  Decreased Interest 1 0  Down, Depressed, Hopeless 0 0  PHQ - 2 Score 1 0  Altered sleeping 3 0  Tired, decreased energy 3 3  Change in appetite 3 0  Feeling bad or failure about yourself  0 0  Trouble concentrating 3 0  Moving slowly or fidgety/restless 0 0  Suicidal thoughts 0 0  PHQ-9 Score 13 3  Difficult doing work/chores Somewhat difficult Somewhat difficult    GAD 7 : Generalized Anxiety Score 05/05/2018  Nervous, Anxious, on Edge 0  Control/stop worrying 0  Worry too much - different things 0  Trouble relaxing 0  Restless 0  Easily annoyed or irritable 0    Afraid - awful might happen 1  Total GAD 7 Score 1  Anxiety Difficulty Not difficult at all   Social History   Tobacco Use  . Smoking status: Former Smoker    Years: 30.00    Last attempt to quit: 01/21/2007    Years since quitting: 11.2  . Smokeless tobacco: Never Used  Substance Use Topics  . Alcohol use: No    Frequency: Never  . Drug use: No    Review of Systems Per HPI unless specifically indicated above    Objective:    BP 117/66 (BP Location: Left Arm, Patient Position: Sitting, Cuff Size: Normal)   Pulse 72   Temp 98.3 F (36.8 C) (Oral)   Ht 5' 4"  (1.626 m)   Wt 195 lb (88.5 kg)   BMI 33.47 kg/m   Wt Readings from Last 3 Encounters:  05/05/18 195 lb (88.5 kg)  02/14/18 195 lb (88.5 kg)  01/31/18 195 lb 3.2 oz (88.5 kg)    Physical Exam Vitals signs reviewed.  Constitutional:      General: She is awake. She is not in acute distress.    Appearance: She is well-developed.  HENT:     Head: Normocephalic and atraumatic.  Neck:     Musculoskeletal: Full passive range of motion without pain, normal range of motion and neck supple. Muscular tenderness (trapezius) present.     Thyroid: No thyromegaly or thyroid tenderness.  Cardiovascular:     Rate and Rhythm: Normal rate and regular rhythm.     Pulses:          Radial pulses are 2+ on the right side and 2+ on the left side.       Posterior tibial pulses are 1+ on the right side and 1+ on the left side.     Heart sounds: Normal heart sounds, S1 normal and S2 normal.  Pulmonary:     Effort: Pulmonary effort is normal. No respiratory distress.     Breath sounds: Normal breath sounds and air entry.  Musculoskeletal:     Right lower leg: No edema.     Left lower leg: No edema.  Skin:    General: Skin is warm and dry.     Capillary Refill: Capillary refill takes less than 2 seconds.  Neurological:     Mental Status: She is alert and oriented to person, place, and time.  Psychiatric:        Attention and  Perception: Attention normal.        Mood and Affect: Mood and affect normal.        Speech: Speech normal. Speech is not rapid and pressured.        Behavior: Behavior normal. Behavior is cooperative.        Thought Content: Thought content normal. Thought content does not include homicidal or suicidal ideation. Thought content does not include homicidal or suicidal plan.        Cognition and Memory: Cognition and memory normal.        Judgment: Judgment normal.      Results for orders placed or performed in visit on 01/31/18  C-reactive protein  Result Value Ref Range   CRP 11.1 (H) <8.0 mg/L  Sed Rate (ESR)  Result Value Ref Range   Sed Rate 14 0 - 30 mm/h  ANA Screen,IFA,Reflex Titer/Pattern,Reflex Mplx 11 Ab Cascade with IdentRA  Result Value Ref Range   Anti Nuclear Antibody(ANA) NEGATIVE NEGATIVE   Rhuematoid fact SerPl-aCnc <97 <02 IU/mL   Cyclic Citrullin Peptide Ab <16 UNITS   14-3-3 eta Protein <0.2 <0.2 ng/mL      Assessment & Plan:   Problem List Items Addressed This Visit      Endocrine   Hypothyroidism Previously stable.  Needs refill and is provided.  Follow-up 6 months with labs.   Relevant Medications   levothyroxine (SYNTHROID, LEVOTHROID) 75 MCG tablet     Other   Myalgia due to statin Hyperlipidemia present, but patient intolerant to statin so stopped after last visit again.  Follow-up prn and annually for lipid.  May need to consider anti [employer]CS[employer]-9 med for future.    Other Visit Diagnoses    Anxiety    -  Primary Stable today on exam.  Medications tolerated without side effects.  Continue celexa 40 mg onc daily.  Refills provided. Followup 6 months.    Relevant Medications   citalopram (CELEXA) 40 MG tablet   Chronic midline low back pain without sciatica     Stable today on exam.  Medications tolerated without side effects.  Continue at current doses.  Refills provided.  Followup 6 months and prn.    Relevant Medications   gabapentin (NEURONTIN) 300  MG capsule      Meds ordered this encounter  Medications  . citalopram (CELEXA) 40 MG tablet    Sig: Take 1 tablet (40 mg total) by mouth daily.    Dispense:  90  tablet    Refill:  1    Order Specific Question:   Supervising Provider    Answer:   Olin Hauser [[id number]]  . gabapentin (NEURONTIN) 300 MG capsule    Sig: Take 1 capsule (300 mg) in am, Take 1 capsule (300 mg) in afternoon, Take 3 capsules (900 mg) at bedtime.    Dispense:  150 capsule    Refill:  2    Order Specific Question:   Supervising Provider    Answer:   Olin Hauser [[id number]]  . levothyroxine (SYNTHROID, LEVOTHROID) 75 MCG tablet    Sig: Take 1 tablet (75 mcg total) by mouth daily before breakfast.    Dispense:  90 tablet    Refill:  1    Order Specific Question:   Supervising Provider    Answer:   Olin Hauser [[id number]]   Follow up plan: Return in about 6 months (around 11/03/2018) for anxiety.  Cassell Smiles, DNP, AGPCNP-BC Adult Gerontology Primary Care Nurse Practitioner [facility] 05/05/2018, 1:37 PM

## 2018-05-11 ENCOUNTER — Encounter: Payer: Self-pay | Admitting: Nurse Practitioner

## 2018-05-31 ENCOUNTER — Ambulatory Visit: Payer: Medicare Other | Admitting: Nurse Practitioner

## 2018-05-31 ENCOUNTER — Other Ambulatory Visit: Payer: Self-pay | Admitting: Nurse Practitioner

## 2018-05-31 NOTE — Telephone Encounter (Signed)
Will address at appt on 06/02/2018

## 2018-06-02 ENCOUNTER — Encounter: Payer: Self-pay | Admitting: Nurse Practitioner

## 2018-06-02 ENCOUNTER — Other Ambulatory Visit: Payer: Self-pay

## 2018-06-02 ENCOUNTER — Ambulatory Visit (INDEPENDENT_AMBULATORY_CARE_PROVIDER_SITE_OTHER): Payer: Medicare Other | Admitting: Nurse Practitioner

## 2018-06-02 VITALS — BP 107/68 | HR 63 | Temp 98.4°F | Resp 16 | Ht 64.0 in | Wt 188.4 lb

## 2018-06-02 DIAGNOSIS — G893 Neoplasm related pain (acute) (chronic): Secondary | ICD-10-CM

## 2018-06-02 DIAGNOSIS — R5383 Other fatigue: Secondary | ICD-10-CM | POA: Diagnosis not present

## 2018-06-02 DIAGNOSIS — R21 Rash and other nonspecific skin eruption: Secondary | ICD-10-CM

## 2018-06-02 DIAGNOSIS — F5104 Psychophysiologic insomnia: Secondary | ICD-10-CM

## 2018-06-02 DIAGNOSIS — G894 Chronic pain syndrome: Secondary | ICD-10-CM

## 2018-06-02 DIAGNOSIS — F339 Major depressive disorder, recurrent, unspecified: Secondary | ICD-10-CM | POA: Diagnosis not present

## 2018-06-02 MED ORDER — TRAMADOL HCL 50 MG PO TABS: 50.0000 mg | ORAL_TABLET | Freq: Two times a day (BID) | ORAL | 0 refills | Status: DC

## 2018-06-02 MED ORDER — TRIAMCINOLONE ACETONIDE 0.025 % EX OINT: 1.0000 "application " | TOPICAL_OINTMENT | Freq: Two times a day (BID) | CUTANEOUS | 0 refills | Status: AC

## 2018-06-02 MED ORDER — TRAZODONE HCL 50 MG PO TABS: 50.0000 mg | ORAL_TABLET | Freq: Every day | ORAL | 5 refills | Status: DC

## 2018-06-02 NOTE — Progress Notes (Signed)
Subjective:    Patient ID: Brenda Grimes, female    DOB: 28-Oct-1965, 53 y.o.   MRN: [id number]  Brenda Grimes is a 53 y.o. female presenting on 06/02/2018 for Fatigue (tired all the time with energy, wants to sleep all the time gotten worse in last month) and Leg Swelling (blue / purple spot on L leg)   HPI  Fatigue Patient reports not sleeping well.  Has had  Feels lots of pelvis/hip pain since her cancer treatment. Tramadol is making her more sleepy, so has reduced taking this med.  Other med with narcotic worked well for pain relief, but does not want to resume. Mother passed recently 05/18/2018 which worsened symptoms, but this was present prior.   Depression - Not well controlled Notes has worsened with death of her mother over last month.  She has not coped well.  Has chronic depression, but is seeing worsening with fatigue, being constantly tired, and sleeping too much.  Some anhedonia present that is not characteristic for patient.  Left leg rash Has had evaluated by dermatology, but did not have treatment.  Has continued antifungal treatment without relief.   Depression screen [facility] 2/9 06/02/2018 05/05/2018 01/20/2018  Decreased Interest 1 1 0  Down, Depressed, Hopeless 2 0 0  PHQ - 2 Score 3 1 0  Altered sleeping 3 3 0  Tired, decreased energy 3 3 3   Change in appetite 3 3 0  Feeling bad or failure about yourself  3 0 0  Trouble concentrating 3 3 0  Moving slowly or fidgety/restless 1 0 0  Suicidal thoughts 0 0 0  PHQ-9 Score 19 13 3   Difficult doing work/chores Very difficult Somewhat difficult Somewhat difficult    GAD 7 : Generalized Anxiety Score 06/02/2018 05/05/2018  Nervous, Anxious, on Edge 1 0  Control/stop worrying 3 0  Worry too much - different things 3 0  Trouble relaxing 3 0  Restless 1 0  Easily annoyed or irritable 1 0  Afraid - awful might happen 1 1  Total GAD 7 Score 13 1  Anxiety Difficulty Somewhat difficult Not difficult at all   Social History    Tobacco Use  . Smoking status: Former Smoker    Years: 30.00    Last attempt to quit: 01/21/2007    Years since quitting: 11.3  . Smokeless tobacco: Never Used  Substance Use Topics  . Alcohol use: No    Frequency: Never  . Drug use: No    Review of Systems Per HPI unless specifically indicated above     Objective:    BP 107/68   Pulse 63   Temp 98.4 F (36.9 C) (Oral)   Resp 16   Ht 5\' 4"  (1.626 m)   Wt 188 lb 6.4 oz (85.5 kg)   SpO2 100%   BMI 32.34 kg/m   Wt Readings from Last 3 Encounters:  06/02/18 188 lb 6.4 oz (85.5 kg)  05/05/18 195 lb (88.5 kg)  02/14/18 195 lb (88.5 kg)    Physical Exam Vitals signs reviewed.  Constitutional:      General: She is not in acute distress.    Appearance: She is well-developed.  HENT:     Head: Normocephalic and atraumatic.  Cardiovascular:     Rate and Rhythm: Normal rate and regular rhythm.     Pulses:          Radial pulses are 2+ on the right side and 2+ on the left side.  Posterior tibial pulses are 1+ on the right side and 1+ on the left side.     Heart sounds: Normal heart sounds, S1 normal and S2 normal.  Pulmonary:     Effort: Pulmonary effort is normal. No respiratory distress.     Breath sounds: Normal breath sounds and air entry.  Musculoskeletal:     Right lower leg: No edema.     Left lower leg: No edema.  Skin:    General: Skin is warm and dry.     Capillary Refill: Capillary refill takes less than 2 seconds.       Neurological:     Mental Status: She is alert and oriented to person, place, and time.  Psychiatric:        Attention and Perception: Attention normal.        Mood and Affect: Affect normal. Mood is depressed.        Speech: Speech normal.        Behavior: Behavior normal. Behavior is cooperative.        Thought Content: Thought content normal. Thought content does not include homicidal or suicidal ideation. Thought content does not include homicidal or suicidal plan.         Cognition and Memory: Cognition and memory normal.        Judgment: Judgment normal.    Results for orders placed or performed in visit on 06/02/18  TSH  Result Value Ref Range   TSH 1.53 mIU/L  CBC with Differential/Platelet  Result Value Ref Range   WBC 2.7 (L) 3.8 - 10.8 Thousand/uL   RBC 3.83 3.80 - 5.10 Million/uL   Hemoglobin 12.9 11.7 - 15.5 g/dL   HCT 37.9 35.0 - 45.0 %   MCV 99.0 80.0 - 100.0 fL   MCH 33.7 (H) 27.0 - 33.0 pg   MCHC 34.0 32.0 - 36.0 g/dL   RDW 12.6 11.0 - 15.0 %   Platelets 168 140 - 400 Thousand/uL   MPV 10.0 7.5 - 12.5 fL   Neutro Abs 1,688 1,500 - 7,800 cells/uL   Lymphs Abs 748 (L) 850 - 3,900 cells/uL   Absolute Monocytes 216 200 - 950 cells/uL   Eosinophils Absolute 19 15 - 500 cells/uL   Basophils Absolute 30 0 - 200 cells/uL   Neutrophils Relative % 62.5 %   Total Lymphocyte 27.7 %   Monocytes Relative 8.0 %   Eosinophils Relative 0.7 %   Basophils Relative 1.1 %  COMPLETE METABOLIC PANEL WITH GFR  Result Value Ref Range   Glucose, Bld 92 65 - 99 mg/dL   BUN 11 7 - 25 mg/dL   Creat 0.79 0.50 - 1.05 mg/dL   GFR, Est Non African American 85 > OR = 60 mL/min/1.74m2   GFR, Est African American 98 > OR = 60 mL/min/1.37m2   BUN/Creatinine Ratio NOT APPLICABLE 6 - 22 (calc)   Sodium 142 135 - 146 mmol/L   Potassium 4.1 3.5 - 5.3 mmol/L   Chloride 108 98 - 110 mmol/L   CO2 28 20 - 32 mmol/L   Calcium 9.1 8.6 - 10.4 mg/dL   Total Protein 6.2 6.1 - 8.1 g/dL   Albumin 4.3 3.6 - 5.1 g/dL   Globulin 1.9 1.9 - 3.7 g/dL (calc)   AG Ratio 2.3 1.0 - 2.5 (calc)   Total Bilirubin 0.6 0.2 - 1.2 mg/dL   Alkaline phosphatase (APISO) 60 33 - 130 U/L   AST 14 10 - 35 U/L   ALT 12 6 -  29 U/L      Assessment & Plan:   Problem List Items Addressed This Visit      Other   Depression, recurrent ([place]) Worsening and unstable.  Patient continue celexa 40 mg with moderate relief.   Complicated by grief of mother in last 42 month since her death.  Plan: 1.  Continue celexa 2. START trazodone 50 mg once daily at bedtime for sleep and additional SSRI coverage.  Can increase dose as tolerated and as needed for depression symptoms. 3. Encourage bereavement and/or counseling. 4. Follow-up 6-8 weeks.   Relevant Medications   traZODone (DESYREL) 50 MG tablet   Chronic pain due to neoplasm - Primary Since rectal cancer treatment, patient with neuropathic chronic pain.  Patient requests refill of tramadol.  Is trying to wean down and off, but is having diarrhea as withdrawal symptom.  Is not noticing improved pain with med.  Requests additional feedback for improving pain.  Plan: 1. Treat depression to optimize sleep and reduce pain associated with sleep disruption 2. Continue tramadol reduction as able. 3. Referral to pain clinic for consideration of nerve blocks or other interventional pain management.   Relevant Medications   traZODone (DESYREL) 50 MG tablet   traMADol (ULTRAM) 50 MG tablet   Other Relevant Orders   Ambulatory referral to Pain Clinic    Other Visit Diagnoses    Chronic pain syndrome       Relevant Medications   traMADol (ULTRAM) 50 MG tablet   Psychophysiological insomnia      See AP depression   Relevant Medications   traZODone (DESYREL) 50 MG tablet   Fatigue, unspecified type     Likely due to worsening depression.  However, will complete medical workup to exclude other medical causes.  Plan: 1. Labs as below 2. Treat depression, insomnia. 3. Follow-up prn after labs   Relevant Orders   TSH (Completed)   CBC with Differential/Platelet (Completed)   COMPLETE METABOLIC PANEL WITH GFR (Completed)   Rash     Likely inflammatory rash since not improving with antifungal agents.  1. START triamcinolone ointment bid x 14 days.  May repeat after 4-5 days off. 2. Consider return visit to dermatology if not improving. 3. Follow-up prn.   Relevant Medications   triamcinolone (KENALOG) 0.025 % ointment      Meds ordered  this encounter  Medications  . traZODone (DESYREL) 50 MG tablet    Sig: Take 1 tablet (50 mg total) by mouth at bedtime. Start for first 7-8 days with 1/2 tablet at bedtime.    Dispense:  30 tablet    Refill:  5    Order Specific Question:   Supervising Provider    Answer:   Olin Hauser [[id number]]  . traMADol (ULTRAM) 50 MG tablet    Sig: Take 1 tablet (50 mg total) by mouth 2 (two) times daily for 30 days.    Dispense:  60 tablet    Refill:  0    Order Specific Question:   Supervising Provider    Answer:   Olin Hauser [[id number]]  . triamcinolone (KENALOG) 0.025 % ointment    Sig: Apply 1 application topically 2 (two) times daily for 7 days.    Dispense:  30 g    Refill:  0    Order Specific Question:   Supervising Provider    Answer:   Olin Hauser [[id number]]    Follow up plan: Return in about 6 weeks (around 07/14/2018) for depression,  fatigue.  Cassell Smiles, DNP, AGPCNP-BC Adult Gerontology Primary Care Nurse Practitioner [facility] 06/02/2018, 10:01 AM

## 2018-06-02 NOTE — Patient Instructions (Addendum)
Brenda Grimes,   Thank you for coming in to clinic today.  1. Continue weaning down on your Tramadol.  Take 1 tab twice daily for now.  In about 1 week try 1 tab daily.  Then one tab every other day.  2. START Trazodone 50 mg tablet at bedtime.  Take 1/2 tab daily for 7-8 days before increasing to full tab.  3. Watch for serotonin syndrome.  The sooner you are off tramadol, the better for reducing this risk.  4. Continue Celexa 40 mg once daily.  5. For your rash: use triamcinolone ointment twice daily for 7 days repeat after 3-4 day break.  Please schedule a follow-up appointment with Cassell Smiles, AGNP. Return in about 6 weeks (around 07/14/2018) for depression, fatigue.  If you have any other questions or concerns, please feel free to call the clinic or send a message through [place]. You may also schedule an earlier appointment if necessary.  You will receive a survey after today's visit either digitally by e-mail or paper by [employer]. Your experiences and feedback matter to [place].  Please respond so we know how we are doing as we provide care for you.   Cassell Smiles, DNP, AGNP-BC Adult Gerontology Nurse Practitioner [place]   Serotonin Syndrome Serotonin is a chemical in your body (neurotransmitter) that helps to control several functions, such as:  Brain and nerve cell function.  Mood and emotions.  Memory.  Eating.  Sleeping.  Sexual activity.  Stress response. Having too much serotonin in your body can cause serotonin syndrome. This condition can be harmful to your brain and nerve cells. This can be a life-threatening condition. What are the causes? This condition may be caused by taking medicines or drugs that increase the level of serotonin in your body, such as:  Antidepressant medicines.  Migraine medicines.  Certain pain medicines.  Certain drugs, including ecstasy, LSD, cocaine, and amphetamines.  Over-the-counter cough or  cold medicines that contain dextromethorphan.  Certain herbal supplements, including St. John's wort, ginseng, and nutmeg. This condition usually occurs when you take these medicines or drugs in combination, but it can also happen with a high dose of a single medicine or drug. What increases the risk? You are more likely to develop this condition if:  You just started taking a medicine or drug that increases the level of serotonin in the body.  You recently increased the dose of a medicine or drug that increases the level of serotonin in the body.  You take more than one medicine or drug that increases the level of serotonin in the body. What are the signs or symptoms? Symptoms of this condition usually start within several hours of taking a medicine or drug. Symptoms may be mild or severe. Mild symptoms include:  Sweating.  Restlessness or agitation.  Muscle twitching or stiffness.  Rapid heart rate.  Nausea and vomiting.  Diarrhea.  Headache.  Shivering or goose bumps.  Confusion. Severe symptoms include:  Irregular heartbeat.  Seizures.  Loss of consciousness.  High fever. How is this diagnosed? This condition may be diagnosed based on:  Your medical history.  A physical exam.  Your prior use of drugs and medicines.  Blood or urine tests. These may be used to rule out other causes of your symptoms. How is this treated? The treatment for this condition depends on the severity of your symptoms.  For mild cases, stopping the medicine or drug that caused your condition is usually  all that is needed.  For moderate to severe cases, treatment in a hospital may be needed to prevent or manage life-threatening symptoms. This may include medicines to control your symptoms, IV fluids, interventions to support your breathing, and treatments to control your body temperature. Follow these instructions at home: Medicines   Take over-the-counter and prescription  medicines only as told by your health care provider. This is important.  Check with your health care provider before you start taking any new prescriptions, over-the-counter medicines, herbs, or supplements.  Avoid combining any medicines that can cause this condition to occur. Lifestyle   Maintain a healthy lifestyle. ? Eat a healthy diet that includes plenty of vegetables, fruits, whole grains, low-fat dairy products, and lean protein. Do not eat a lot of foods that are high in fat, added sugars, or salt. ? Get the right amount and quality of sleep. Most adults need 7-9 hours of sleep each night. ? Make time to exercise, even if it is only for short periods of time. Most adults should exercise for at least 150 minutes each week. ? Do not drink alcohol. ? Do not use illegal drugs, and do not take medicines for reasons other than they are prescribed. General instructions  Do not use any products that contain nicotine or tobacco, such as cigarettes and e-cigarettes. If you need help quitting, ask your health care provider.  Keep all follow-up visits as told by your health care provider. This is important. Contact a health care provider if:  Your symptoms do not improve or they get worse. Get help right away if you:  Have worsening confusion, severe headache, chest pain, high fever, seizures, or loss of consciousness.  Experience serious side effects of medicine, such as swelling of your face, lips, tongue, or throat.  Have serious thoughts about hurting yourself or others. These symptoms may represent a serious problem that is an emergency. Do not wait to see if the symptoms will go away. Get medical help right away. Call your local emergency services (911 in the [place] Do not drive yourself to the hospital. If you ever feel like you may hurt yourself or others, or have thoughts about taking your own life, get help right away. You can go to your nearest emergency department or  call:  Your local emergency services (911 in the [place]  A suicide crisis helpline, such as the [facility] at 321-861-6022. This is open 24 hours a day. Summary  Serotonin is a brain chemical that helps to regulate the nervous system. High levels of serotonin in the body can cause serotonin syndrome, which is a very dangerous condition.  This condition may be caused by taking medicines or drugs that increase the level of serotonin in your body.  Treatment depends on the severity of your symptoms. For mild cases, stopping the medicine or drug that caused your condition is usually all that is needed.  Check with your health care provider before you start taking any new prescriptions, over-the-counter medicines, herbs, or supplements. This information is not intended to replace advice given to you by your health care provider. Make sure you discuss any questions you have with your health care provider. Document Released: 05/28/2004 Document Revised: 05/28/2017 Document Reviewed: 05/28/2017 Elsevier Interactive Patient Education  2019 [employer].

## 2018-06-03 ENCOUNTER — Encounter: Payer: Self-pay | Admitting: Nurse Practitioner

## 2018-06-03 LAB — COMPLETE METABOLIC PANEL WITH GFR
AG Ratio: 2.3 (calc) (ref 1.0–2.5)
ALT: 12 U/L (ref 6–29)
AST: 14 U/L (ref 10–35)
Albumin: 4.3 g/dL (ref 3.6–5.1)
Alkaline phosphatase (APISO): 60 U/L (ref 33–130)
BUN: 11 mg/dL (ref 7–25)
CO2: 28 mmol/L (ref 20–32)
Calcium: 9.1 mg/dL (ref 8.6–10.4)
Chloride: 108 mmol/L (ref 98–110)
Creat: 0.79 mg/dL (ref 0.50–1.05)
GFR, Est African American: 98 mL/min/{1.73_m2} (ref >=60)
GFR, Est Non African American: 85 mL/min/{1.73_m2} (ref >=60)
Globulin: 1.9 g/dL (calc) (ref 1.9–3.7)
Glucose, Bld: 92 mg/dL (ref 65–99)
Potassium: 4.1 mmol/L (ref 3.5–5.3)
Sodium: 142 mmol/L (ref 135–146)
Total Bilirubin: 0.6 mg/dL (ref 0.2–1.2)
Total Protein: 6.2 g/dL (ref 6.1–8.1)

## 2018-06-03 LAB — CBC WITH DIFFERENTIAL/PLATELET
Absolute Monocytes: 216 cells/uL (ref 200–950)
Basophils Absolute: 30 cells/uL (ref 0–200)
Basophils Relative: 1.1 %
Eosinophils Absolute: 19 cells/uL (ref 15–500)
Eosinophils Relative: 0.7 %
HCT: 37.9 % (ref 35.0–45.0)
Hemoglobin: 12.9 g/dL (ref 11.7–15.5)
Lymphs Abs: 748 cells/uL — ABNORMAL LOW (ref 850–3900)
MCH: 33.7 pg — ABNORMAL HIGH (ref 27.0–33.0)
MCHC: 34 g/dL (ref 32.0–36.0)
MCV: 99 fL (ref 80.0–100.0)
MPV: 10 fL (ref 7.5–12.5)
Monocytes Relative: 8 %
Neutro Abs: 1688 cells/uL (ref 1500–7800)
Neutrophils Relative %: 62.5 %
Platelets: 168 10*3/uL (ref 140–400)
RBC: 3.83 10*6/uL (ref 3.80–5.10)
RDW: 12.6 % (ref 11.0–15.0)
Total Lymphocyte: 27.7 %
WBC: 2.7 10*3/uL — ABNORMAL LOW (ref 3.8–10.8)

## 2018-06-03 LAB — TSH: TSH: 1.53 mIU/L

## 2018-06-29 ENCOUNTER — Ambulatory Visit: Payer: Medicare Other | Admitting: Student in an Organized Health Care Education/Training Program

## 2018-06-30 ENCOUNTER — Ambulatory Visit
Payer: Medicare Other | Attending: Student in an Organized Health Care Education/Training Program | Admitting: Student in an Organized Health Care Education/Training Program

## 2018-06-30 ENCOUNTER — Other Ambulatory Visit: Payer: Self-pay

## 2018-06-30 ENCOUNTER — Encounter: Payer: Self-pay | Admitting: Student in an Organized Health Care Education/Training Program

## 2018-06-30 VITALS — BP 103/80 | HR 71 | Temp 97.7°F | Resp 18 | Ht 64.0 in | Wt 188.8 lb

## 2018-06-30 DIAGNOSIS — G893 Neoplasm related pain (acute) (chronic): Secondary | ICD-10-CM | POA: Diagnosis not present

## 2018-06-30 DIAGNOSIS — G894 Chronic pain syndrome: Secondary | ICD-10-CM | POA: Diagnosis not present

## 2018-06-30 DIAGNOSIS — M7918 Myalgia, other site: Secondary | ICD-10-CM | POA: Diagnosis not present

## 2018-06-30 DIAGNOSIS — G8929 Other chronic pain: Secondary | ICD-10-CM | POA: Diagnosis not present

## 2018-06-30 DIAGNOSIS — E6609 Other obesity due to excess calories: Secondary | ICD-10-CM | POA: Insufficient documentation

## 2018-06-30 DIAGNOSIS — M7062 Trochanteric bursitis, left hip: Secondary | ICD-10-CM | POA: Diagnosis not present

## 2018-06-30 DIAGNOSIS — C210 Malignant neoplasm of anus, unspecified: Secondary | ICD-10-CM | POA: Insufficient documentation

## 2018-06-30 DIAGNOSIS — M533 Sacrococcygeal disorders, not elsewhere classified: Secondary | ICD-10-CM | POA: Insufficient documentation

## 2018-06-30 DIAGNOSIS — M7061 Trochanteric bursitis, right hip: Secondary | ICD-10-CM | POA: Diagnosis not present

## 2018-06-30 MED ORDER — GABAPENTIN 600 MG PO TABS: 600.0000 mg | ORAL_TABLET | Freq: Three times a day (TID) | ORAL | 1 refills | Status: DC

## 2018-06-30 NOTE — Progress Notes (Signed)
Safety precautions to be maintained throughout the outpatient stay will include: orient to surroundings, keep bed in low position, maintain call bell within reach at all times, provide assistance with transfer out of bed and ambulation.  

## 2018-06-30 NOTE — Progress Notes (Signed)
Patient's Name: Brenda Grimes  MRN: [id number]  Referring Provider: Mikey College, *  DOB: 1965/10/13  PCP: Mikey College, NP  DOS: 06/30/2018  Note by: Gillis Santa, MD  Service setting: Ambulatory outpatient  Specialty: Interventional Pain Management  Location: [facility] (AMB) Pain Management Facility  Visit type: Initial Patient Evaluation  Patient type: New Patient   Primary Reason(s) for Visit: Encounter for initial evaluation of one or more chronic problems (new to examiner) potentially causing chronic pain, and posing a threat to normal musculoskeletal function. (Level of risk: High) CC: Neck Pain; Hip Pain (bilateral); and Back Pain (lower)  HPI  Brenda Grimes is a 53 y.o. year old, female patient, who comes today to see [place] for the first time for an initial evaluation of her chronic pain. She has Myalgia due to statin; Hypothyroidism; Hypovitaminosis D; Obesity (BMI 30-39.9); Premature ovarian failure; Reduced libido; Trochanteric bursitis of both hips; Vaginal stenosis; Dyslipidemia; Depression, recurrent ([place]); Coronary artery disease involving native coronary artery of native heart with angina pectoris ([place]); DDD (degenerative disc disease), lumbosacral; Chronic pain due to neoplasm; and Anal cancer ([place]) on their problem list. Today she comes in for evaluation of her Neck Pain; Hip Pain (bilateral); and Back Pain (lower)  Pain Assessment: Location: Right, Left Hip Radiating: backs of both thighs Onset: More than a month ago Duration: Chronic pain Quality: Aching, Burning Severity: 4 /10 (subjective, self-reported pain score)  Note: Reported level is compatible with observation.                         When using our objective Pain Scale, levels between 6 and 10/10 are said to belong in an emergency room, as it progressively worsens from a 6/10, described as severely limiting, requiring emergency care not usually available at an outpatient pain management facility. At a 6/10 level,  communication becomes difficult and requires great effort. Assistance to reach the emergency department may be required. Facial flushing and profuse sweating along with potentially dangerous increases in heart rate and blood pressure will be evident. Effect on ADL: difficulty doing daily tasks Timing: Intermittent Modifying factors: Tramadol, Gabapentin, Oxycodone. leaning over the bed BP: 103/80  HR: 71  Onset and Duration: Gradual and Present longer than 3 months Cause of pain: Unknown Severity: Getting worse, NAS-11 at its worse: 10/10, NAS-11 at its best: 8/10, NAS-11 now: 9/10 and NAS-11 on the average: 8/10 Timing: Not influenced by the time of the day Aggravating Factors: Bending, Intercourse (sex), Kneeling, Motion, Prolonged sitting, Prolonged standing, Walking and Working Alleviating Factors: Stretching, Cold packs, Lying down, Medications, Resting and TENS Associated Problems: Depression, Dizziness, Fatigue, Inability to concentrate, Inability to control bowel, Numbness, Personality changes, Sadness, Swelling, Temperature changes, Tingling, Weakness, Pain that wakes patient up and Pain that does not allow patient to sleep Quality of Pain: Aching, Annoying, Burning, Deep, Disabling, Exhausting, Feeling of weight, Heavy, Nagging, Pressure-like, Sharp, Shooting and Uncomfortable Previous Examinations or Tests: CT scan, MRI scan, X-rays and Nerve conduction test Previous Treatments: Physical Therapy, Relaxation therapy, Steroid treatments by mouth, Stretching exercises and TENS  The patient comes into the clinics today for the first time for a chronic pain management evaluation.   53 year old female with a history of anal cancer status post chemo therapy and radiation therapy at [facility] approximately 5 years ago who presents with a chief complaint of bilateral hip pain that radiates into bilateral thighs.  Patient has had a lumbar and cervical MRI completed at  [facility] with results below.  She has  tried physical therapy in the past without any significant benefit.  She does engage in stretching exercises at home.  She takes tramadol 100 mg 2-3 times a day which she does not find benefit with.  Patient used to be a Doctor, hospital in the past but ever since her anal cancer and chemotherapy and radiation, she endorses fatigue and significant hand pain.  Patient is also on gabapentin, 300 mg, 300 mg, 900 mg nightly.  She has tried hydrocodone and oxycodone in the past with some benefit.  She is status post bilateral greater trochanteric bursa injections which she states were moderately helpful.  Today I took the time to provide the patient with information regarding my pain practice. The patient was informed that my practice is divided into two sections: an interventional pain management section, as well as a completely separate and distinct medication management section. I explained that I have procedure days for my interventional therapies, and evaluation days for follow-ups and medication management. Because of the amount of documentation required during both, they are kept separated. This means that there is the possibility that she may be scheduled for a procedure on one day, and medication management the next. I have also informed her that because of staffing and facility limitations, I no longer take patients for medication management only. To illustrate the reasons for this, I gave the patient the example of surgeons, and how inappropriate it would be to refer a patient to his/her care, just to write for the post-surgical antibiotics on a surgery done by a different surgeon.   Because interventional pain management is my board-certified specialty, the patient was informed that joining my practice means that they are open to any and all interventional therapies. I made it clear that this does not mean that they will be forced to have any procedures done. What this means is that I believe interventional  therapies to be essential part of the diagnosis and proper management of chronic pain conditions. Therefore, patients not interested in these interventional alternatives will be better served under the care of a different practitioner.  The patient was also made aware of my Comprehensive Pain Management Safety Guidelines where by joining my practice, they limit all of their nerve blocks and joint injections to those done by our practice, for as long as we are retained to manage their care.   Historic Controlled Substance Pharmacotherapy Review   04/21/2018  1   02/02/2018  Tramadol Hcl 50 MG Tablet  180.00 30 [place] R   [id number]   [place] ([id number])   1  30.00 MME  Comm Ins   [place]    Medications: The patient did not bring the medication(s) to the appointment, as requested in our "New Patient Package" Pharmacodynamics: Desired effects: Analgesia: The patient reports <50% benefit. Reported improvement in function: The patient reports medications have not provided significant benefit Clinically meaningful improvement in function (CMIF): Sustained CMIF goals met Perceived effectiveness: Described as ineffective and would like to make some changes Undesirable effects: Side-effects or Adverse reactions: None reported Historical Background Evaluation: [place] PMP: Six (6) year initial data search conducted.             [place] Department of public safety, offender search: Editor, commissioning Information) Non-contributory Risk Assessment Profile: Aberrant behavior: None observed or detected today Risk factors for fatal opioid overdose: None identified today Fatal overdose hazard ratio (HR): Calculation deferred Non-fatal overdose hazard ratio (HR): Calculation deferred Risk of  opioid abuse or dependence: 0.7-3.0% with doses ? 36 MME/day and 6.1-26% with doses ? 120 MME/day. Substance use disorder (SUD) risk level: See below Personal History of Substance Abuse (SUD-Substance use disorder):  Alcohol: Negative  Illegal Drugs:  Negative  Rx Drugs: Negative  ORT Risk Level calculation: High Risk Opioid Risk Tool - 06/30/18 1326      Family History of Substance Abuse   Alcohol  Positive Female    Illegal Drugs  Positive Female    Rx Drugs  Positive Female or Female      Personal History of Substance Abuse   Alcohol  Negative    Illegal Drugs  Negative    Rx Drugs  Negative      Age   Age between 55-45 years   No      History of Preadolescent Sexual Abuse   History of Preadolescent Sexual Abuse  Negative or Female      Psychological Disease   Psychological Disease  Negative    Depression  Positive      Total Score   Opioid Risk Tool Scoring  11    Opioid Risk Interpretation  High Risk      ORT Scoring interpretation table:  Score <3 = Low Risk for SUD  Score between 4-7 = Moderate Risk for SUD  Score >8 = High Risk for Opioid Abuse   PHQ-2 Depression Scale:  Total score: 0  PHQ-2 Scoring interpretation table: (Score and probability of major depressive disorder)  Score 0 = No depression  Score 1 = 15.4% Probability  Score 2 = 21.1% Probability  Score 3 = 38.4% Probability  Score 4 = 45.5% Probability  Score 5 = 56.4% Probability  Score 6 = 78.6% Probability   PHQ-9 Depression Scale:  Total score: 0  PHQ-9 Scoring interpretation table:  Score 0-4 = No depression  Score 5-9 = Mild depression  Score 10-14 = Moderate depression  Score 15-19 = Moderately severe depression  Score 20-27 = Severe depression (2.4 times higher risk of SUD and 2.89 times higher risk of overuse)   Pharmacologic Plan: As per protocol, I have not taken over any controlled substance management, pending the results of ordered tests and/or consults.            Initial impression: Pending review of available data and ordered tests.  Meds   Current Outpatient Medications:  .  cholecalciferol (VITAMIN D) 1000 units tablet, Take 1,000 Units by mouth daily., Disp: , Rfl:  .  Flaxseed, Linseed, (FLAX SEED OIL) 1000 MG CAPS, Take  by mouth., Disp: , Rfl:  .  fluticasone (FLONASE) 50 MCG/ACT nasal spray, 1 spray by Each Nare route daily., Disp: , Rfl:  .  levothyroxine (SYNTHROID, LEVOTHROID) 75 MCG tablet, Take 1 tablet (75 mcg total) by mouth daily before breakfast., Disp: 90 tablet, Rfl: 1 .  naproxen (NAPROSYN) 500 MG tablet, Take by mouth., Disp: , Rfl:  .  traMADol (ULTRAM) 50 MG tablet, Take 1 tablet (50 mg total) by mouth 2 (two) times daily for 30 days., Disp: 60 tablet, Rfl: 0 .  traZODone (DESYREL) 50 MG tablet, Take 1 tablet (50 mg total) by mouth at bedtime. Start for first 7-8 days with 1/2 tablet at bedtime., Disp: 30 tablet, Rfl: 5 .  vitamin B-12 (CYANOCOBALAMIN) 1000 MCG tablet, Take 1,000 mcg by mouth daily., Disp: , Rfl:  .  aspirin 81 MG chewable tablet, Chew by mouth., Disp: , Rfl:  .  citalopram (CELEXA) 40 MG tablet,  Take 1 tablet (40 mg total) by mouth daily. (Patient not taking: Reported on 06/30/2018), Disp: 90 tablet, Rfl: 1 .  gabapentin (NEURONTIN) 600 MG tablet, Take 1 tablet (600 mg total) by mouth every 8 (eight) hours., Disp: 90 tablet, Rfl: 1 .  nitroGLYCERIN (NITROSTAT) 0.4 MG SL tablet, Place 1 tablet (0.4 mg total) under the tongue every 5 (five) minutes as needed for chest pain. (Patient not taking: Reported on 01/31/2018), Disp: 100 tablet, Rfl: 3  Imaging Review   EXAM: Magnetic resonance imaging, spinal canal and contents, cervical without contrast material. DATE: 07/23/2017 11:19 AM ACCESSION: [id number] UN DICTATED: 07/23/2017 11:41 AM INTERPRETATION LOCATION: [place]  CLINICAL INDICATION: 53 years old Female with PAIN CERVICAL SPINE -M47.812-Spondylosis of cervical region without myelopathy or radiculopathy   COMPARISON: Cervical spine radiographs performed 06/25/2017 and prior studies  TECHNIQUE: Multiplanar multisequence MRI was performed through the cervical spine without intravenous contrast.  FINDINGS: The vertebral bodies are normally aligned. The signal intensity  from the vertebral body bone marrow and spinal cord is normal. Likely hemangiomas in the the C6 and C7 vertebral bodies, unchanged from prior.  At C2-3, there is no significant canal stenosis or neural foraminal narrowing.  At C3-4, there is a tiny central disc protrusion. No significant canal stenosis or neural foraminal narrowing.  At C4-5, there is mild uncovertebral hypertrophy resulting in no significant canal stenosis or neuroforaminal narrowing.  At C5-6, there is a disc osteophyte complex resulting in minimal canal stenosis. Mild uncovertebral hypertrophy with no significant neural foraminal narrowing.  At C6-7, there is a disc osteophyte complex, resulting in minimal canal stenosis without significant neural foraminal narrowing.  At C7-T1, there is no significant canal stenosis or neural foraminal narrowing.    CLINICAL INDICATION: 53 years old Female with PAIN LUMBAR SPINE-M47.812-Spondylosis of cervical region without myelopathy or radiculopathy   COMPARISON: Lumbar spine radiographs performed 06/25/2017 and prior studies  TECHNIQUE: Multiplanar MRI was performed through the lumbar spine without intravenous contrast.  FINDINGS: The vertebral bodies are normally aligned. Bone marrow signal intensity is normal. Disc dessication is present at L5-S1 with mild disc space height loss. The visualized cord is unremarkable and the conus medullaris ends at a normal level.   At L2-L5, there is no significant canal stenosis or neural foraminal narrowing.  At L5-S1, there is a posterior disc protrusion and annular fissure, unchanged from prior with no significant canal stenosis or neural foraminal narrowing.  For the purposes of this dictation, the lowest well formed intervertebral disc space is assumed to be the L5-S1 level, and there are presumed to be five lumbar-type vertebral bodies.   Complexity Note: Imaging results reviewed. Results shared with Ms. Hodapp, using Layman's terms.                          ROS  Cardiovascular: Heart attack ( Date: 2008) Pulmonary or Respiratory: No reported pulmonary signs or symptoms such as wheezing and difficulty taking a deep full breath (Asthma), difficulty blowing air out (Emphysema), coughing up mucus (Bronchitis), persistent dry cough, or temporary stoppage of breathing during sleep Neurological: No reported neurological signs or symptoms such as seizures, abnormal skin sensations, urinary and/or fecal incontinence, being born with an abnormal open spine and/or a tethered spinal cord Review of Past Neurological Studies: No results found for this or any previous visit. Psychological-Psychiatric: Depressed Gastrointestinal: Reflux or heatburn Genitourinary: Passing kidney stones Hematological: Weakness due to low blood hemoglobin or red blood cell count (  Anemia) Endocrine: Slow thyroid Rheumatologic: Rheumatoid arthritis Musculoskeletal: Negative for myasthenia gravis, muscular dystrophy, multiple sclerosis or malignant hyperthermia Work History: Retired  Allergies  Ms. Colla is allergic to sulfa antibiotics; sulfasalazine; and tape.  Laboratory Chemistry  Inflammation Markers (CRP: Acute Phase) (ESR: Chronic Phase) Lab Results  Component Value Date   CRP 11.1 (H) 01/31/2018   ESRSEDRATE 14 01/31/2018                         Rheumatology Markers Lab Results  Component Value Date   RF <14 01/31/2018   ANA NEGATIVE 01/31/2018                        Renal Function Markers Lab Results  Component Value Date   BUN 11 06/02/2018   CREATININE 0.79 37/90/2409   BCR NOT APPLICABLE 73/53/2992   GFRAA 98 06/02/2018   GFRNONAA 85 06/02/2018                             Hepatic Function Markers Lab Results  Component Value Date   AST 14 06/02/2018   ALT 12 06/02/2018                        Electrolytes Lab Results  Component Value Date   NA 142 06/02/2018   K 4.1 06/02/2018   CL 108 06/02/2018   CALCIUM 9.1  06/02/2018                        Neuropathy Markers No results found for: VITAMINB12, FOLATE, HGBA1C, HIV                      CNS Tests No results found for: COLORCSF, APPEARCSF, RBCCOUNTCSF, WBCCSF, POLYSCSF, LYMPHSCSF, EOSCSF, PROTEINCSF, GLUCCSF, JCVIRUS, CSFOLI, IGGCSF                      Bone Pathology Markers Lab Results  Component Value Date   VD25OH 28 (L) 01/24/2018                         Coagulation Parameters Lab Results  Component Value Date   PLT 168 06/02/2018                        Cardiovascular Markers Lab Results  Component Value Date   TROPONINI <0.03 03/23/2017   HGB 12.9 06/02/2018   HCT 37.9 06/02/2018                         CA Markers No results found for: CEA, CA125, LABCA2                      Endocrine Markers Lab Results  Component Value Date   TSH 1.53 06/02/2018                        Note: Lab results reviewed.  PFSH  Drug: Ms. Achorn  reports no history of drug use. Alcohol:  reports no history of alcohol use. Tobacco:  reports that she quit smoking about 11 years ago. She quit after 30.00 years of use. She has never used smokeless tobacco. Medical:  has a past medical history of  Arthritis, CAD (coronary artery disease), Cancer ([place]), Colon polyp, Depression, Hyperlipidemia, MI (myocardial infarction) ([place]), Thyroid disease, and Vitamin D deficiency. Family: family history includes Alcohol abuse in her brother; Alzheimer's disease in her maternal grandmother and mother; CAD in her father; Cirrhosis in her brother; Healthy in her daughter, son, and son; Heart disease in her paternal grandfather, paternal grandmother, and paternal uncle; Hyperlipidemia in her brother and brother; Stroke in her brother; Thyroid disease in her brother and brother.  Past Surgical History:  Procedure Laterality Date  . CARDIAC SURGERY    . CORONARY ANGIOPLASTY WITH STENT PLACEMENT    . TUBAL LIGATION     Active Ambulatory Problems    Diagnosis Date  Noted  . Myalgia due to statin 05/05/2018  . Hypothyroidism 02/06/2010  . Hypovitaminosis D 09/25/2016  . Obesity (BMI 30-39.9) 05/05/2018  . 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 Reduced libido 06/14/2017  . Trochanteric bursitis of both hips 12/23/2012  . Vaginal stenosis 12/19/2013  . Dyslipidemia 06/21/2017  . Depression, recurrent ([place]) 12/17/2014  . Coronary artery disease involving native coronary artery of native heart with angina pectoris ([place]) 08/27/2010  . DDD (degenerative disc disease), lumbosacral 07/28/2010  . Chronic pain due to neoplasm 01/21/2017  . Anal cancer ([place]) 07/20/2012   Resolved Ambulatory Problems    Diagnosis Date Noted  . No Resolved Ambulatory Problems   Past Medical History:  Diagnosis Date  . Arthritis   . CAD (coronary artery disease)   . Cancer ([place])   . Colon polyp   . Depression   . Hyperlipidemia   . MI (myocardial infarction) ([place])   . Thyroid disease   . Vitamin D deficiency    Constitutional Exam  General appearance: Well nourished, well developed, and well hydrated. In no apparent acute distress Vitals:   06/30/18 1310  BP: 103/80  Pulse: 71  Resp: 18  Temp: 97.7 F (36.5 C)  TempSrc: Oral  SpO2: 98%  Weight: 188 lb 12.8 oz (85.6 kg)  Height: 5' 4"  (1.626 m)   BMI Assessment: Estimated body mass index is 32.41 kg/m as calculated from the following:   Height as of this encounter: 5' 4"  (1.626 m).   Weight as of this encounter: 188 lb 12.8 oz (85.6 kg).  BMI interpretation table: BMI level Category Range association with higher incidence of chronic pain  <18 kg/m2 Underweight   18.5-24.9 kg/m2 Ideal body weight   25-29.9 kg/m2 Overweight Increased incidence by 20%  30-34.9 kg/m2 Obese (Class I) Increased incidence by 68%  35-39.9 kg/m2 Severe obesity (Class II) Increased incidence by 136%  >40 kg/m2 Extreme obesity (Class III) Increased incidence by 254%   Patient's current BMI Ideal Body weight  Body mass index  is 32.41 kg/m. Ideal body weight: 54.7 kg (120 lb 9.5 oz) Adjusted ideal body weight: 67.1 kg (147 lb 14 oz)   BMI Readings from Last 4 Encounters:  06/30/18 32.41 kg/m  06/02/18 32.34 kg/m  05/05/18 33.47 kg/m  02/14/18 33.47 kg/m   Wt Readings from Last 4 Encounters:  06/30/18 188 lb 12.8 oz (85.6 kg)  06/02/18 188 lb 6.4 oz (85.5 kg)  05/05/18 195 lb (88.5 kg)  02/14/18 195 lb (88.5 kg)  Psych/Mental status: Alert, oriented x 3 (person, place, & time)       Eyes: PERLA Respiratory: No evidence of acute respiratory distress  Cervical Spine Area Exam  Skin & Axial Inspection: No masses, redness, edema, swelling, or associated skin lesions Alignment: Symmetrical Functional ROM: Unrestricted  ROM      Stability: No instability detected Muscle Tone/Strength: Functionally intact. No obvious neuro-muscular anomalies detected. Sensory (Neurological): Unimpaired Palpation: No palpable anomalies              Upper Extremity (UE) Exam    Side: Right upper extremity  Side: Left upper extremity  Skin & Extremity Inspection: Skin color, temperature, and hair growth are WNL. No peripheral edema or cyanosis. No masses, redness, swelling, asymmetry, or associated skin lesions. No contractures.  Skin & Extremity Inspection: Skin color, temperature, and hair growth are WNL. No peripheral edema or cyanosis. No masses, redness, swelling, asymmetry, or associated skin lesions. No contractures.  Functional ROM: Unrestricted ROM          Functional ROM: Unrestricted ROM          Muscle Tone/Strength: Functionally intact. No obvious neuro-muscular anomalies detected.  Muscle Tone/Strength: Functionally intact. No obvious neuro-muscular anomalies detected.  Sensory (Neurological): Unimpaired          Sensory (Neurological): Unimpaired          Palpation: No palpable anomalies              Palpation: No palpable anomalies              Provocative Test(s):  Phalen's test: deferred Tinel's test:  deferred Apley's scratch test (touch opposite shoulder):  Action 1 (Across chest): deferred Action 2 (Overhead): deferred Action 3 (LB reach): deferred   Provocative Test(s):  Phalen's test: deferred Tinel's test: deferred Apley's scratch test (touch opposite shoulder):  Action 1 (Across chest): deferred Action 2 (Overhead): deferred Action 3 (LB reach): deferred    Thoracic Spine Area Exam  Skin & Axial Inspection: No masses, redness, or swelling Alignment: Symmetrical Functional ROM: Unrestricted ROM Stability: No instability detected Muscle Tone/Strength: Functionally intact. No obvious neuro-muscular anomalies detected. Sensory (Neurological): Unimpaired Muscle strength & Tone: No palpable anomalies  Lumbar Spine Area Exam  Skin & Axial Inspection: No masses, redness, or swelling Alignment: Symmetrical Functional ROM: Decreased ROM       Stability: No instability detected Muscle Tone/Strength: Functionally intact. No obvious neuro-muscular anomalies detected. Sensory (Neurological): Musculoskeletal pain pattern Palpation: No palpable anomalies       Provocative Tests: Hyperextension/rotation test: (+) bilaterally for facet joint pain. Lumbar quadrant test (Kemp's test): (+) due to pain. Lateral bending test: (+) due to pain. Patrick's Maneuver: (+) for bilateral S-I arthralgia             FABER* test: deferred today                   S-I anterior distraction/compression test: deferred today         S-I lateral compression test: deferred today         S-I Thigh-thrust test: deferred today         S-I Gaenslen's test: deferred today         *(Flexion, ABduction and External Rotation)  Gait & Posture Assessment  Ambulation: Unassisted Gait: Relatively normal for age and body habitus Posture: WNL   Lower Extremity Exam    Side: Right lower extremity  Side: Left lower extremity  Stability: No instability observed          Stability: No instability observed           Skin & Extremity Inspection: Skin color, temperature, and hair growth are WNL. No peripheral edema or cyanosis. No masses, redness, swelling, asymmetry, or associated skin lesions. No contractures.  Skin & Extremity Inspection: Skin color, temperature, and hair growth are WNL. No peripheral edema or cyanosis. No masses, redness, swelling, asymmetry, or associated skin lesions. No contractures.  Functional ROM: Unrestricted ROM                  Functional ROM: Unrestricted ROM                  Muscle Tone/Strength: Functionally intact. No obvious neuro-muscular anomalies detected.  Muscle Tone/Strength: Functionally intact. No obvious neuro-muscular anomalies detected.  Sensory (Neurological): Unimpaired        Sensory (Neurological): Unimpaired        DTR: Patellar: deferred today Achilles: deferred today Plantar: deferred today  DTR: Patellar: deferred today Achilles: deferred today Plantar: deferred today  Palpation: No palpable anomalies  Palpation: No palpable anomalies   Assessment  Primary Diagnosis & Pertinent Problem List: The primary encounter diagnosis was Chronic SI joint pain. 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  Visit Diagnosis (New problems to examiner): 1. Chronic SI joint pain   2. 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 Trochanteric bursitis of both hips   4. Musculoskeletal pain   5. Cancer related pain   6. Chronic pain syndrome    53 year old female with a history of anal cancer status post chemo therapy and radiation therapy at [facility] approximately 5 years ago who presents with a chief complaint of bilateral hip pain that radiates into bilateral thighs.  Patient has had a lumbar and cervical MRI completed at [facility] with results below.  She has tried physical therapy in the past  without any significant benefit.  She does engage in stretching exercises at home.  She takes tramadol 100 mg 2-3 times a day which she does not find benefit with.  Patient used to be a Doctor, hospital in the past but ever since her anal cancer and chemotherapy and radiation, she endorses fatigue and significant hand pain.  Patient is also on gabapentin, 300 mg, 300 mg, 900 mg nightly.  She has tried hydrocodone and oxycodone in the past with some benefit.  She is status post bilateral greater trochanteric bursa injections which she states were moderately helpful.  In regards to therapeutic plan, would like to obtain x-rays of the patient's sacroiliac joints given bilateral hip and groin pain.  Also discussed diagnostic bilateral sacroiliac joint injection pending SI joint x-rays.  Can also consider repeating bilateral greater trochanteric bursa injections as well.  Patient does complain of deep rectal and pelvic pain.  This could be related to her history of anal cancer.  We discussed ganglion of impar sympathetic nerve block.  We can discuss this further when the patient follows up.  Regards to medication management, recommending titrating gabapentin up to 600 mg 3 times a day.  Future considerations could include Cymbalta.  Recommended patient try and wean herself down from tramadol since she does not find it effective.  Can consider very low-dose hydrocodone, quantity 10 to 15/month for breakthrough pain when patient follows up so long as UDS appropriate.  Plan: -X-ray SI joint, consider bilateral SI joint block -UDS today -Increase gabapentin as below -Recommend patient try and wean/decrease tramadol intake since it is not effective.  Note: Please be advised that as per protocol, today's visit has been an evaluation  only. We have not taken over the patient's controlled substance management.  Ordered Lab-work, Procedure(s), Referral(s), & Consult(s): Orders Placed This Encounter  Procedures  .  DG Si Joints  . Compliance Drug Analysis, Ur   Pharmacotherapy (current): Medications ordered:  Meds ordered this encounter  Medications  . gabapentin (NEURONTIN) 600 MG tablet    Sig: Take 1 tablet (600 mg total) by mouth every 8 (eight) hours.    Dispense:  90 tablet    Refill:  1    Do not place this medication, or any other prescription from our practice, on "Automatic Refill". Patient may have prescription filled one day early if pharmacy is closed on scheduled refill date.   Medications administered during this visit: Shella Spearing had no medications administered during this visit.   Pharmacological management options:  Opioid Analgesics: The patient was informed that there is no guarantee that she would be a candidate for opioid analgesics. The decision will be made following CDC guidelines. This decision will be based on the results of diagnostic studies, as well as Ms. Passon's risk profile.   Membrane stabilizer: To be determined at a later time consider Lyrica or Cymbalta  Muscle relaxant: To be determined at a later time  NSAID: To be determined at a later time  Other analgesic(s): To be determined at a later time   Interventional management options: Ms. Hagey was informed that there is no guarantee that she would be a candidate for interventional therapies. The decision will be based on the results of diagnostic studies, as well as Ms. Mckeever's risk profile.  Procedure(s) under consideration:  Bilateral sacroiliac joint block Diagnostic lumbar facet medial branch nerve block Bilateral greater trochanteric bursa injection Ganglion of impar sympathetic block for rectal/pelvic pain   Provider-requested follow-up: Return in about 4 weeks (around 07/28/2018) for Medication Management, After Imaging.  Future Appointments  Date Time Provider [place]  07/28/2018  1:45 PM Gillis Santa, MD [facility] None    Primary Care Physician: Mikey College, NP Location: [facility]  Outpatient Pain Management Facility Note by: Gillis Santa, M.D, Date: 06/30/2018; Time: 2:51 PM  Patient Instructions  A prescription for Gabapentin has been sent to your pharmacy.

## 2018-06-30 NOTE — Patient Instructions (Signed)
A prescription for Gabapentin has been sent to your pharmacy.

## 2018-07-05 LAB — COMPLIANCE DRUG ANALYSIS, UR

## 2018-07-28 ENCOUNTER — Encounter: Payer: Medicare Other | Admitting: Student in an Organized Health Care Education/Training Program

## 2018-08-02 ENCOUNTER — Other Ambulatory Visit: Payer: Self-pay

## 2018-08-02 ENCOUNTER — Telehealth: Payer: Self-pay | Admitting: *Deleted

## 2018-08-02 ENCOUNTER — Ambulatory Visit
Payer: Medicare Other | Attending: Student in an Organized Health Care Education/Training Program | Admitting: Student in an Organized Health Care Education/Training Program

## 2018-08-02 DIAGNOSIS — G894 Chronic pain syndrome: Secondary | ICD-10-CM

## 2018-08-02 DIAGNOSIS — G8929 Other chronic pain: Secondary | ICD-10-CM

## 2018-08-02 DIAGNOSIS — M7918 Myalgia, other site: Secondary | ICD-10-CM | POA: Diagnosis not present

## 2018-08-02 DIAGNOSIS — M533 Sacrococcygeal disorders, not elsewhere classified: Secondary | ICD-10-CM | POA: Diagnosis not present

## 2018-08-02 DIAGNOSIS — C210 Malignant neoplasm of anus, unspecified: Secondary | ICD-10-CM | POA: Diagnosis not present

## 2018-08-02 DIAGNOSIS — G893 Neoplasm related pain (acute) (chronic): Secondary | ICD-10-CM | POA: Diagnosis not present

## 2018-08-02 NOTE — Progress Notes (Signed)
Pain Management Encounter Note - Virtual Visit via Telephone Telehealth (real-time audio visits between healthcare provider and patient).  Patient's Phone No. & Preferred Pharmacy:  [phone] (home); [phone] (mobile); (Preferred) [place], [place] [address] Phone: [phone] Fax: [phone]  [id number] - [place], [place] - [address] [place] [address] Phone: [phone] Fax: [place], [place] [address] [place] [address] Phone: [phone] Fax: [phone]   Pre-screening note:  Our staff contacted Brenda Grimes and offered her an "in person", "face-to-face" appointment versus a telephone encounter. She indicated preferring the telephone encounter, at this time.  Reason for Virtual Visit: COVID-19*  Social distancing based on CDC and AMA recommendations.   I contacted Brenda Grimes on 08/02/2018 at 12:34 PM by telephone and clearly identified myself as Gillis Santa, MD. I verified that I was speaking with the correct person using two identifiers (Name and date of birth: [birth year]).  Advanced Informed Consent I sought verbal advanced consent from Brenda Grimes for telemedicine interactions and virtual visit. I informed Ms. Merica of the security and privacy concerns, risks, and limitations associated with performing an evaluation and management service by telephone. I also informed Ms. Hansen of the availability of "in person" appointments and I informed her of the possibility of a patient responsible charge related to this service. Ms. Stranahan expressed understanding and agreed to proceed.   Historic Elements   Brenda Grimes is a 53 y.o. year old, female patient evaluated today after her last encounter by our practice on 06/30/2018. Ms. Khun  has a past medical history of Arthritis, CAD (coronary  artery disease), Cancer ([place]), Colon polyp, Depression, Hyperlipidemia, MI (myocardial infarction) ([place]), Thyroid disease, and Vitamin D deficiency. She also  has a past surgical history that includes Tubal ligation; Cardiac surgery; and Coronary angioplasty with stent. Ms. Mangold has a current medication list which includes the following prescription(s): aspirin, cholecalciferol, citalopram, flax seed oil, fluticasone, gabapentin, levothyroxine, naproxen, nitroglycerin, trazodone, and vitamin b-12. She  reports that she quit smoking about 11 years ago. She quit after 30.00 years of use. She has never used smokeless tobacco. She reports that she does not drink alcohol or use drugs. Ms. Lefebre is allergic to sulfa antibiotics; sulfasalazine; and tape.   HPI  I last saw her on 06/30/2018. She is being evaluated for medication management.  This is patient's second interaction with me.  Her first visit with me was on 06/30/2018.  At that time SI joint x-ray was ordered however the patient has not completed this yet.  She is finding benefit with increase gabapentin, 600 mg 3 times a day.  I have instructed her to continue this.  Instructed the patient to follow-up with [place] after she has completed her SI joint x-rays and at that point we can consider diagnostic sacroiliac joint injection.  Patient endorsed understanding.   Review of recent tests  DG Chest 2 View CLINICAL DATA:  Left-sided chest pain for 1 month, shortness of Breath  EXAM: CHEST  2 VIEW  COMPARISON:  08/24/2006  FINDINGS: Cardiac shadow is within normal limits. The lungs are well aerated bilaterally. Mild interstitial changes are seen without focal confluent infiltrate. No sizable effusion is noted. No acute bony abnormality is seen.  IMPRESSION: Mild interstitial changes likely of a chronic nature. No focal infiltrate or sizable  effusion is seen.  Electronically Signed   By: Inez Catalina M.D.   On: 03/23/2017 18:10   Office Visit  on 06/30/2018  Component Date Value Ref Range Status  . Summary 06/30/2018 FINAL   Final   Comment: ==================================================================== TOXASSURE COMP DRUG ANALYSIS,UR ==================================================================== Test                             Result       Flag       Units Drug Present and Declared for Prescription Verification   Tramadol                       >3311        EXPECTED   ng/mg creat   O-Desmethyltramadol            >3311        EXPECTED   ng/mg creat   N-Desmethyltramadol            >3311        EXPECTED   ng/mg creat    Source of tramadol is a prescription medication.    O-desmethyltramadol and N-desmethyltramadol are expected    metabolites of tramadol.   Gabapentin                     PRESENT      EXPECTED   Citalopram                     PRESENT      EXPECTED   Desmethylcitalopram            PRESENT      EXPECTED    Desmethylcitalopram is an expected metabolite of citalopram or    the enantiomeric form, escitalopram.   Trazodone                      PRESENT      EXPECTED   1,3 chlorophenyl pi                          perazine    PRESENT      EXPECTED    1,3-chlorophenyl piperazine is an expected metabolite of    trazodone.   Naproxen                       PRESENT      EXPECTED Drug Present not Declared for Prescription Verification   Bupropion                      PRESENT      UNEXPECTED   Hydroxybupropion               PRESENT      UNEXPECTED    Hydroxybupropion is an expected metabolite of bupropion. Drug Absent but Declared for Prescription Verification   Salicylate                     Not Detected UNEXPECTED    Aspirin, as indicated in the declared medication list, is not    always detected even when used as directed. ==================================================================== Test                      Result    Flag   Units      Ref Range  Creatinine              151              mg/dL       >=20 ==================================================================== Declared Medications:  The flagging and interpretation on this report are based on the  following declar                          ed medications.  Unexpected results may arise from  inaccuracies in the declared medications.  **Note: The testing scope of this panel includes these medications:  Citalopram  Gabapentin  Naproxen  Tramadol  Trazodone  **Note: The testing scope of this panel does not include small to  moderate amounts of these reported medications:  Aspirin (Aspirin 81)  **Note: The testing scope of this panel does not include following  reported medications:  Cholecalciferol  Cyanocobalamin  Fluticasone  Levothyroxine  Nitroglycerin  Supplement (Omega-3) ==================================================================== For clinical consultation, please call [phone]. ====================================================================    Assessment  The primary encounter diagnosis was Chronic SI joint pain. 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  Plan of Care  I discussed the assessment and treatment plan with the patient. The patient was provided an opportunity to ask questions and all were answered. The patient agreed with the plan and demonstrated an understanding of the instructions.  Patient advised to call back or seek an in-person evaluation if the symptoms or condition worsens.  This is patient's second interaction with me.  Her first visit with me was on 06/30/2018.  At that time SI joint x-ray was ordered however the patient has not completed this yet.  She is finding benefit with increase gabapentin, 600 mg 3 times a day.  I have instructed her to continue this.  Instructed the patient to follow-up with [place] after she has completed her SI joint  x-rays and at that point we can consider diagnostic sacroiliac joint injection.  Patient endorsed understanding.   I am having Brenda Grimes maintain her nitroGLYCERIN, fluticasone, naproxen, aspirin, cholecalciferol, Flax Seed Oil, vitamin B-12, citalopram, levothyroxine, traZODone, and gabapentin. Pharmacotherapy (Medications Ordered): No orders of the defined types were placed in this encounter.  Orders:  No orders of the defined types were placed in this encounter.  Follow-up plan:   Return for After Imaging.   Total duration of non-face-to-face encounter: 38minutes.  Note by: Gillis Santa, MD Date: 08/02/2018; Time: 12:34 PM  Disclaimer:  * Given the special circumstances of the COVID-19 pandemic, the federal government has announced that the Office for Civil Rights (OCR) will exercise its enforcement discretion and will not impose penalties on physicians using telehealth in the event of noncompliance with regulatory requirements under the [place] and Accountability Act (HIPAA) in connection with the good faith provision of telehealth during the YOVZC-58 national public health emergency. ([place])

## 2018-08-17 ENCOUNTER — Other Ambulatory Visit: Payer: Self-pay | Admitting: Nurse Practitioner

## 2018-08-17 DIAGNOSIS — G894 Chronic pain syndrome: Secondary | ICD-10-CM

## 2018-08-17 NOTE — Telephone Encounter (Signed)
Patient will require OV prior to any additional Tramadol prescriptions.

## 2018-10-11 ENCOUNTER — Telehealth: Payer: Self-pay

## 2018-10-11 DIAGNOSIS — Z20818 Contact with and (suspected) exposure to other bacterial communicable diseases: Secondary | ICD-10-CM

## 2018-10-11 MED ORDER — AZITHROMYCIN 250 MG PO TABS: ORAL_TABLET | ORAL | 0 refills | Status: DC

## 2018-10-11 NOTE — Telephone Encounter (Signed)
The pt called requesting abx to take as a preventative measure for her and husband . Her 3 mth grandson and daughter was just diagnose with pertussis.

## 2018-10-11 NOTE — Telephone Encounter (Signed)
FYI both the pt and her husband are asymptomatic, but spend a lot of time around the child. The pt states that the child is actually her daughters foster child.  The mother didn't receive any vaccinations or prenatal care.

## 2018-10-11 NOTE — Telephone Encounter (Addendum)
The pt was notified of the recommendation and she verbalize understanding. She will call the health department to find out if she can get the TDAP vaccine there. If not the pt will contact us back and schedule an appt to get the vaccine.

## 2018-10-11 NOTE — Telephone Encounter (Signed)
Spoke with Brenda Grimes CMA reviewed this case, and ultimately both patient and her husband Brenda Grimes, my patient, will need antibiotic prophylaxis dose to prevent - due to exposure.  Sent rx for both patients, Azithromycin Z pak (antibiotic) 2 tabs day 1, then 1 tab x 4 days  Both will need booster Tdap vaccines, they are to follow up with guidelines given to them, can contact local Health Department first, if need we can arrange for Tdap to be given here if cannot get at health department.  Last TDap for Brenda Grimes was 2015 and we do not have any on file for Brenda Grimes, she does not know. Both will need Tdap booster.  [name], DO [facility] 10/11/2018, 1:49 PM

## 2018-10-31 ENCOUNTER — Other Ambulatory Visit: Payer: Self-pay

## 2018-10-31 ENCOUNTER — Telehealth: Payer: Self-pay | Admitting: Nurse Practitioner

## 2018-10-31 DIAGNOSIS — F339 Major depressive disorder, recurrent, unspecified: Secondary | ICD-10-CM

## 2018-10-31 MED ORDER — BUPROPION HCL ER (XL) 150 MG PO TB24: 150.0000 mg | ORAL_TABLET | Freq: Every day | ORAL | 0 refills | Status: DC

## 2018-10-31 NOTE — Telephone Encounter (Signed)
Pt called requesting refill on Wellbutrin called into [facility]  [place]

## 2018-10-31 NOTE — Telephone Encounter (Signed)
Attempted to contact the patient to scheduled an appt.

## 2018-10-31 NOTE — Telephone Encounter (Signed)
Update I received was that she never picked up Celexa back in 05/2018 and she never stopped Buproprion XL 150mg  daily, she has continued this despite it being documented DC'd in chart.  I will offer refill since she is still taking Buproprion per pharmacy. DC'd celexa on our chart.  Sent rx buproprion XL 150mg  daily 90 day to [facility]  She can discuss with PCP at next visit to discuss  mood  [name], [facility] 10/31/2018, 5:14 PM

## 2018-12-11 ENCOUNTER — Other Ambulatory Visit: Payer: Self-pay | Admitting: Nurse Practitioner

## 2018-12-11 DIAGNOSIS — G894 Chronic pain syndrome: Secondary | ICD-10-CM

## 2018-12-12 ENCOUNTER — Other Ambulatory Visit: Payer: Self-pay | Admitting: Nurse Practitioner

## 2018-12-12 DIAGNOSIS — F339 Major depressive disorder, recurrent, unspecified: Secondary | ICD-10-CM

## 2018-12-12 DIAGNOSIS — F5104 Psychophysiologic insomnia: Secondary | ICD-10-CM

## 2018-12-19 ENCOUNTER — Encounter: Payer: Self-pay | Admitting: Nurse Practitioner

## 2018-12-19 ENCOUNTER — Ambulatory Visit (INDEPENDENT_AMBULATORY_CARE_PROVIDER_SITE_OTHER): Payer: Medicare Other | Admitting: Nurse Practitioner

## 2018-12-19 ENCOUNTER — Other Ambulatory Visit: Payer: Self-pay

## 2018-12-19 DIAGNOSIS — E039 Hypothyroidism, unspecified: Secondary | ICD-10-CM | POA: Diagnosis not present

## 2018-12-19 DIAGNOSIS — G894 Chronic pain syndrome: Secondary | ICD-10-CM | POA: Diagnosis not present

## 2018-12-19 MED ORDER — TRAMADOL HCL 50 MG PO TABS: ORAL_TABLET | ORAL | 5 refills | Status: DC

## 2018-12-19 NOTE — Progress Notes (Signed)
Telemedicine Encounter: Disclosed to patient at start of encounter that we will provide appropriate telemedicine services.  Patient consents to be treated via phone prior to discussion. - Patient is at her home and is accessed via telephone. - Services are provided by Cassell Smiles from [facility].  Subjective:    Patient ID: Brenda Grimes, female    DOB: 02-21-1966, 53 y.o.   MRN: [id number]  Brenda Grimes is a 53 y.o. female presenting on 12/19/2018 for Hypothyroidism (pt concern her thyroid levels could be off. moodness and hoarseness x3 days )  HPI Hypothyroidism - Patient presents today for recheck of thyroid due to symptomatic changes that are similar to changes in past with thyroid abnormalities.  Irritability and hoarseness are biggest symptoms and have bothered Patient for last 3 days. - Pt states she is taking her levothyroxine 75 mcg in the am at least 1 hour before eating or drinking and taking other medicines.  Supplements, vitamins are taken later in the day.  - She denies, fatigue, excess energy, weight changes, heart racing, heart palpitations, heat and cold intolerance, changes in hair/skin/nails, and lower leg swelling.  - She does not have any compressive symptoms to include difficulty swallowing, globus sensation, or difficulty breathing when lying flat. - Patient has intentionally lost weight down to 175 lbs.     Pain Stomach and rectal pain continues.  Patient regularly takes 1 pill twice daily.  Occasionally takes 3 times daily per report especially if abdominal pain/cramping intensifies.  Patient sometimes only takes 1 tab per day.  Social History   Tobacco Use  . Smoking status: Former Smoker    Years: 30.00    Quit date: 01/21/2007    Years since quitting: 11.9  . Smokeless tobacco: Never Used  Substance Use Topics  . Alcohol use: No    Frequency: Never  . Drug use: No    Review of Systems Per HPI unless specifically indicated above     Objective:    There were no vitals taken for this visit.  Wt Readings from Last 3 Encounters:  06/30/18 188 lb 12.8 oz (85.6 kg)  06/02/18 188 lb 6.4 oz (85.5 kg)  05/05/18 195 lb (88.5 kg)    Physical Exam Patient remotely monitored.  Verbal communication appropriate.  Cognition normal.   Results for orders placed or performed in visit on 06/30/18  Compliance Drug Analysis, Ur  Result Value Ref Range   Summary FINAL       Assessment & Plan:   Problem List Items Addressed This Visit      Endocrine   Hypothyroidism - Primary   Relevant Orders   TSH   T4, free     Other   Chronic pain syndrome   Relevant Medications   traMADol (ULTRAM) 50 MG tablet      Chronic abdominal pain 2/2 cancer treatment of anal cancer. Currently remains well controlled with tramadol 50 mg bid.  Refill provided.  Reviewed controlled status of medication with patient.  PDMP reviewed and consistent with patient report of fills, only fills from this practice are noted.  Follow-up 6 months.  Hypothyroidism Symptomatic clinically.  Status unknown without labs.  Repeat labs.  Continue levothyroxine 75 mcg until labs return.  Follow-up 6 months and sooner prn after labs/med change.  May need thyroid/neck US if thyroid function is normal to evaluate hoarseness.  Meds ordered this encounter  Medications  . traMADol (ULTRAM) 50 MG tablet    Sig: TAKE ONE  TABLET BY MOUTH TWICE A DAY FOR moderate abdominal and rectal pain.    Dispense:  60 tablet    Refill:  5    Order Specific Question:   Supervising Provider    Answer:   Olin Hauser [[id number]]    - Time spent in direct consultation with patient via telemedicine about above concerns: 10 minutes  Follow up plan: Follow-up 6 months and sooner if needed.  Cassell Smiles, DNP, AGPCNP-BC Adult Gerontology Primary Care Nurse Practitioner [facility] 12/19/2018, 11:30 AM

## 2018-12-20 ENCOUNTER — Other Ambulatory Visit: Payer: Self-pay | Admitting: Nurse Practitioner

## 2018-12-20 DIAGNOSIS — E039 Hypothyroidism, unspecified: Secondary | ICD-10-CM | POA: Diagnosis not present

## 2018-12-20 NOTE — Addendum Note (Signed)
Addended by: Wilson Singer on: 12/20/2018 09:06 AM   Modules accepted: Orders

## 2018-12-21 LAB — T4: T4, Total: 8.8 ug/dL (ref 5.1–11.9)

## 2018-12-28 ENCOUNTER — Encounter: Payer: Self-pay | Admitting: Nurse Practitioner

## 2018-12-30 ENCOUNTER — Telehealth: Payer: Self-pay

## 2018-12-30 NOTE — Telephone Encounter (Signed)
Attempted to contact the pt to notify her of her lab results and the need to come back in the office to get her TSH checked. The pt had her labs drawn 10 days ago, but there was a lab error and only her T4 resulted.

## 2019-01-19 ENCOUNTER — Encounter: Payer: Medicare Other | Admitting: Nurse Practitioner

## 2019-01-24 ENCOUNTER — Other Ambulatory Visit: Payer: Self-pay | Admitting: Nurse Practitioner

## 2019-01-24 DIAGNOSIS — E039 Hypothyroidism, unspecified: Secondary | ICD-10-CM

## 2019-01-26 ENCOUNTER — Other Ambulatory Visit: Payer: Self-pay | Admitting: Nurse Practitioner

## 2019-01-26 DIAGNOSIS — E039 Hypothyroidism, unspecified: Secondary | ICD-10-CM

## 2019-01-27 ENCOUNTER — Encounter: Payer: Self-pay | Admitting: Nurse Practitioner

## 2019-01-27 ENCOUNTER — Ambulatory Visit (INDEPENDENT_AMBULATORY_CARE_PROVIDER_SITE_OTHER): Payer: Medicare Other | Admitting: Nurse Practitioner

## 2019-01-27 ENCOUNTER — Other Ambulatory Visit: Payer: Self-pay

## 2019-01-27 VITALS — BP 108/61 | HR 95 | Ht 64.0 in | Wt 172.0 lb

## 2019-01-27 DIAGNOSIS — E781 Pure hyperglyceridemia: Secondary | ICD-10-CM | POA: Diagnosis not present

## 2019-01-27 DIAGNOSIS — Z23 Encounter for immunization: Secondary | ICD-10-CM | POA: Diagnosis not present

## 2019-01-27 DIAGNOSIS — E039 Hypothyroidism, unspecified: Secondary | ICD-10-CM

## 2019-01-27 DIAGNOSIS — Z1239 Encounter for other screening for malignant neoplasm of breast: Secondary | ICD-10-CM | POA: Diagnosis not present

## 2019-01-27 DIAGNOSIS — Q525 Fusion of labia: Secondary | ICD-10-CM | POA: Diagnosis not present

## 2019-01-27 DIAGNOSIS — Z1231 Encounter for screening mammogram for malignant neoplasm of breast: Secondary | ICD-10-CM

## 2019-01-27 DIAGNOSIS — Z124 Encounter for screening for malignant neoplasm of cervix: Secondary | ICD-10-CM

## 2019-01-27 DIAGNOSIS — Z130 Encounter for screening for diseases of the blood and blood-forming organs and certain disorders involving the immune mechanism: Secondary | ICD-10-CM | POA: Diagnosis not present

## 2019-01-27 MED ORDER — LEVOTHYROXINE SODIUM 75 MCG PO TABS: ORAL_TABLET | ORAL | 0 refills | Status: DC

## 2019-01-27 NOTE — Progress Notes (Signed)
Subjective:    Patient ID: Brenda Grimes, female    DOB: [birth year], 53 y.o.   MRN: [id number]  Brenda Grimes is a 53 y.o. female presenting on 01/27/2019 for Annual Exam   HPI Annual Physical Exam Patient has been feeling well.  They have no acute concerns today. Sleeps trazodone was causing am headache, so she has stopped this.  She started taking magnesium at night and it is helping 7-8 hours per night less often interrupted - wakes up due to husband getting up/restless.  HEALTH MAINTENANCE: Weight/BMI: Reduction by 30 lbs Physical activity: no regular activity Diet: Patient is eating Keto diet Seatbelt: always Sunscreen: usually PAP: due today - patient agrees Mammogram: patient agrees Colon Cancer Screen: 2018 HIV: declines Optometry: regularly Dentistry: cleanings regularly  VACCINES: Tetanus: due Influenza: due today - received Shingles: recommended    Past Medical History:  Diagnosis Date  . Arthritis   . CAD (coronary artery disease)    s/p stenting x 2  . Cancer ([place])    anus cancer  . Colon polyp   . Depression   . Hyperlipidemia   . MI (myocardial infarction) ([place])   . Thyroid disease   . Vitamin D deficiency    Past Surgical History:  Procedure Laterality Date  . CARDIAC SURGERY    . CORONARY ANGIOPLASTY WITH STENT PLACEMENT    . TUBAL LIGATION     Social History   Socioeconomic History  . Marital status: Married    Spouse name: Not on file  . Number of children: Not on file  . Years of education: Not on file  . Highest education level: Not on file  Occupational History  . Not on file  Social Needs  . Financial resource strain: Not on file  . Food insecurity    Worry: Not on file    Inability: Not on file  . Transportation needs    Medical: Not on file    Non-medical: Not on file  Tobacco Use  . Smoking status: Former Smoker    Years: 30.00    Quit date: 01/21/2007    Years since quitting: 12.0  . Smokeless tobacco: Never Used   Substance and Sexual Activity  . Alcohol use: No    Frequency: Never  . Drug use: No  . Sexual activity: Not on file  Lifestyle  . Physical activity    Days per week: Not on file    Minutes per session: Not on file  . Stress: Not on file  Relationships  . Social Herbalist on phone: Not on file    Gets together: Not on file    Attends religious service: Not on file    Active member of club or organization: Not on file    Attends meetings of clubs or organizations: Not on file    Relationship status: Not on file  . Intimate partner violence    Fear of current or ex partner: Not on file    Emotionally abused: Not on file    Physically abused: Not on file    Forced sexual activity: Not on file  Other Topics Concern  . Not on file  Social History Narrative  . Not on file   Family History  Problem Relation Age of Onset  . Alzheimer's disease Mother   . CAD Father   . Stroke Brother   . Alzheimer's disease Maternal Grandmother   . Thyroid disease Brother   . Hyperlipidemia Brother   .  Hyperlipidemia Brother   . Alcohol abuse Brother   . Healthy Daughter   . Healthy Son   . Heart disease Paternal Uncle   . Heart disease Paternal Grandmother   . Heart disease Paternal Grandfather   . Thyroid disease Brother   . Cirrhosis Brother   . Healthy Son    Current Outpatient Medications on File Prior to Visit  Medication Sig  . buPROPion (WELLBUTRIN XL) 150 MG 24 hr tablet Take 1 tablet (150 mg total) by mouth daily.  . cholecalciferol (VITAMIN D) 1000 units tablet Take 1,000 Units by mouth daily.  . EUTHYROX 75 MCG tablet TAKE 1 TABLET BY MOUTH ONCE DAILY BEFORE BREAKFAST  . Flaxseed, Linseed, (FLAX SEED OIL) 1000 MG CAPS Take by mouth.  . gabapentin (NEURONTIN) 600 MG tablet Take 1 tablet (600 mg total) by mouth every 8 (eight) hours.  . Magnesium 100 MG CAPS Take by mouth.  . naproxen (NAPROSYN) 500 MG tablet Take by mouth.  . Potassium (POTASSIMIN PO) Take by  mouth.  . traMADol (ULTRAM) 50 MG tablet TAKE ONE TABLET BY MOUTH TWICE A DAY FOR moderate abdominal and rectal pain.  . vitamin A 10000 UT capsule Take 10,000 Units by mouth daily.  . vitamin B-12 (CYANOCOBALAMIN) 1000 MCG tablet Take 1,000 mcg by mouth daily.  Marland Kitchen aspirin 81 MG chewable tablet Chew by mouth.  . fluticasone (FLONASE) 50 MCG/ACT nasal spray 1 spray by Each Nare route daily.  . nitroGLYCERIN (NITROSTAT) 0.4 MG SL tablet Place 1 tablet (0.4 mg total) under the tongue every 5 (five) minutes as needed for chest pain. (Patient not taking: Reported on 01/31/2018)  . traZODone (DESYREL) 50 MG tablet TAKE 1 TABLET BY MOUTH AT BEDTIME START  FOR  FIRST  7-8  DAYS  WITH  1/2  TABLET  AT  BEDTIME (Patient not taking: Reported on 01/27/2019)   No current facility-administered medications on file prior to visit.     Review of Systems  Constitutional: Negative for chills and fever.  HENT: Negative for congestion and sore throat.   Eyes: Negative for pain.  Respiratory: Negative for cough, shortness of breath and wheezing.   Cardiovascular: Negative for chest pain, palpitations and leg swelling.  Gastrointestinal: Negative for abdominal pain, blood in stool, constipation, diarrhea, nausea and vomiting.  Endocrine: Negative for polydipsia.  Genitourinary: Negative for dysuria, frequency, hematuria and urgency.  Musculoskeletal: Negative for back pain, myalgias and neck pain.  Skin: Negative.  Negative for rash.  Allergic/Immunologic: Negative for environmental allergies.  Neurological: Negative for dizziness, weakness and headaches.  Hematological: Does not bruise/bleed easily.  Psychiatric/Behavioral: Negative for dysphoric mood and suicidal ideas. The patient is not nervous/anxious.    Per HPI unless specifically indicated above     Objective:    BP 108/61 (BP Location: Left Arm, Patient Position: Sitting, Cuff Size: Normal)   Pulse 95   Ht 5\' 4"  (1.626 m)   Wt 172 lb (78 kg)    BMI 29.52 kg/m   Wt Readings from Last 3 Encounters:  01/27/19 172 lb (78 kg)  06/30/18 188 lb 12.8 oz (85.6 kg)  06/02/18 188 lb 6.4 oz (85.5 kg)    Physical Exam Vitals signs and nursing note reviewed.  Constitutional:      General: She is not in acute distress.    Appearance: She is well-developed.  HENT:     Head: Normocephalic and atraumatic.     Right Ear: External ear normal.     Left Ear:  External ear normal.     Nose: Nose normal.  Eyes:     Conjunctiva/sclera: Conjunctivae normal.     Pupils: Pupils are equal, round, and reactive to light.  Neck:     Musculoskeletal: Normal range of motion and neck supple.     Thyroid: No thyromegaly.     Vascular: No JVD.     Trachea: No tracheal deviation.  Cardiovascular:     Rate and Rhythm: Normal rate and regular rhythm.     Heart sounds: Normal heart sounds. No murmur. No friction rub. No gallop.   Pulmonary:     Effort: Pulmonary effort is normal. No respiratory distress.     Breath sounds: Normal breath sounds.  Abdominal:     General: Bowel sounds are normal. There is no distension.     Palpations: Abdomen is soft.     Tenderness: There is no abdominal tenderness.  Genitourinary:    Comments: Normal external female genitalia without lesions or fusion. Vaginal canal without lesions, but has fusion of vaginal walls that prevents passing through for cervical evaluation. Physiologic discharge on exam. Bimanual exam could not be performed. Musculoskeletal: Normal range of motion.  Lymphadenopathy:     Cervical: No cervical adenopathy.  Skin:    General: Skin is warm and dry.  Neurological:     Mental Status: She is alert and oriented to person, place, and time.     Cranial Nerves: No cranial nerve deficit.  Psychiatric:        Behavior: Behavior normal.        Thought Content: Thought content normal.        Judgment: Judgment normal.       Assessment & Plan:   Problem List Items Addressed This Visit       Endocrine   Hypothyroidism   Relevant Medications   levothyroxine (EUTHYROX) 75 MCG tablet   Other Relevant Orders   CBC with Differential/Platelet   TSH     Other   Fusion of vulva    Other Visit Diagnoses    Need for immunization against influenza    -  Primary   Relevant Orders   Flu Vaccine QUAD 6+ mos PF IM (Fluarix Quad PF) (Completed)   Hypertriglyceridemia       Relevant Orders   Comprehensive metabolic panel   Lipid panel   TSH   Screening for deficiency anemia       Relevant Orders   CBC with Differential/Platelet   Encounter for Papanicolaou smear for cervical cancer screening       Breast cancer screening       Relevant Orders   MM DIGITAL SCREENING BILATERAL   Encounter for screening mammogram for malignant neoplasm of breast        Relevant Orders   MM DIGITAL SCREENING BILATERAL    Annual physical exam with 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  Well adult with no acute concerns.  Plan: 1. Obtain health maintenance screenings as above according to age. - Increase physical activity to 30 minutes most days of the week.  - Eat healthy diet high in vegetables and fruits; low in refined carbohydrates. - Screening labs and tests as ordered 2. Encouraged patient to follow-up with her oncologist regarding vaginal fusion.  Can consider gyn referral to treat/discuss possible effects.  Discussed possible concern for developing fistula in future.  Monitoring for discharge/frequent infections (vaginal or urinary tract) 3. Return 1 year for annual physical.  Meds ordered this encounter  Medications  . levothyroxine (EUTHYROX) 75 MCG tablet    Sig: TAKE 1 TABLET BY MOUTH ONCE DAILY BEFORE BREAKFAST    Dispense:  90 tablet    Refill:  0    Order Specific Question:   Supervising Provider    Answer:   Olin Hauser [[id number]]   Follow up plan: Return in about 6 months (around 07/27/2019) for thyroid, 1 year Annual  Physical.  Cassell Smiles, DNP, AGPCNP-BC Adult Gerontology Primary Care Nurse Practitioner [facility] 01/27/2019, 10:15 AM

## 2019-01-27 NOTE — Patient Instructions (Addendum)
Shella Spearing,   Thank you for coming in to clinic today.  1. Call Dr. Barbie Haggis for your cancer surveillance.   - Possible need for repeat colonoscopy based on last colonoscopy results and their recommendations for surveillance. - Mention vaginal fusion and increased risk for future complications after radiation.  We can consider GYN referral for opinion as well.  2. You will be due for FASTING BLOOD WORK.  This means you should eat no food or drink after midnight.  Drink only water or coffee without cream/sugar on the morning of your lab visit. - Please go ahead and schedule a "Lab Only" visit in the morning at the clinic for lab draw in the next 7 days. - Your results will be available about 2-3 days after blood draw.  If you have set up a MyChart account, you can can log in to MyChart online to view your results and a brief explanation. Also, we can discuss your results together at your next office visit if you would like.  3. Your mammogram order has been placed.  Call the Scheduling phone number at [phone] to schedule your mammogram at your convenience.  You can choose to go to either location listed below.  Let the scheduler know which location you prefer.  [place]  [place], [place] [address]   [facility] [address] [place], [place] [address]   Please schedule a follow-up appointment with Cassell Smiles, AGNP. Return in about 6 months (around 07/27/2019) for thyroid, 1 year Annual Physical.  If you have any other questions or concerns, please feel free to call the clinic or send a message through [place]. You may also schedule an earlier appointment if necessary.  You will receive a survey after today's visit either digitally by e-mail or paper by [employer]. Your experiences and feedback matter to [place].  Please respond so we know how we are doing as we provide  care for you.   Cassell Smiles, DNP, AGNP-BC Adult Gerontology Nurse Practitioner [place]

## 2019-02-01 ENCOUNTER — Encounter: Payer: Self-pay | Admitting: Nurse Practitioner

## 2019-02-01 DIAGNOSIS — Q525 Fusion of labia: Secondary | ICD-10-CM | POA: Insufficient documentation

## 2019-02-01 NOTE — Addendum Note (Signed)
Addended by: Cassell Smiles R on: 02/01/2019 01:34 PM   Modules accepted: Level of Service

## 2019-02-09 ENCOUNTER — Other Ambulatory Visit: Payer: Medicare Other

## 2019-02-09 ENCOUNTER — Other Ambulatory Visit: Payer: Self-pay | Admitting: Nurse Practitioner

## 2019-02-09 ENCOUNTER — Ambulatory Visit
Admission: RE | Admit: 2019-02-09 | Discharge: 2019-02-09 | Disposition: A | Discharge status: Home / Self Care | Payer: Medicare Other | Source: Ambulatory Visit | Attending: Nurse Practitioner | Admitting: Nurse Practitioner

## 2019-02-09 ENCOUNTER — Other Ambulatory Visit: Payer: Self-pay

## 2019-02-09 DIAGNOSIS — Z130 Encounter for screening for diseases of the blood and blood-forming organs and certain disorders involving the immune mechanism: Secondary | ICD-10-CM | POA: Diagnosis not present

## 2019-02-09 DIAGNOSIS — Z1239 Encounter for other screening for malignant neoplasm of breast: Secondary | ICD-10-CM

## 2019-02-09 DIAGNOSIS — Z1231 Encounter for screening mammogram for malignant neoplasm of breast: Secondary | ICD-10-CM

## 2019-02-09 DIAGNOSIS — E039 Hypothyroidism, unspecified: Secondary | ICD-10-CM | POA: Diagnosis not present

## 2019-02-09 DIAGNOSIS — E781 Pure hyperglyceridemia: Secondary | ICD-10-CM | POA: Diagnosis not present

## 2019-02-10 ENCOUNTER — Other Ambulatory Visit: Payer: Self-pay | Admitting: Nurse Practitioner

## 2019-02-10 DIAGNOSIS — E039 Hypothyroidism, unspecified: Secondary | ICD-10-CM

## 2019-02-10 LAB — COMPREHENSIVE METABOLIC PANEL
AG Ratio: 2.2 (calc) (ref 1.0–2.5)
ALT: 12 U/L (ref 6–29)
AST: 18 U/L (ref 10–35)
Albumin: 4.1 g/dL (ref 3.6–5.1)
Alkaline phosphatase (APISO): 74 U/L (ref 37–153)
BUN: 17 mg/dL (ref 7–25)
CO2: 28 mmol/L (ref 20–32)
Calcium: 9.1 mg/dL (ref 8.6–10.4)
Chloride: 107 mmol/L (ref 98–110)
Creat: 0.87 mg/dL (ref 0.50–1.05)
Globulin: 1.9 g/dL (calc) (ref 1.9–3.7)
Glucose, Bld: 100 mg/dL — ABNORMAL HIGH (ref 65–99)
Potassium: 4.8 mmol/L (ref 3.5–5.3)
Sodium: 141 mmol/L (ref 135–146)
Total Bilirubin: 0.6 mg/dL (ref 0.2–1.2)
Total Protein: 6 g/dL — ABNORMAL LOW (ref 6.1–8.1)

## 2019-02-10 LAB — CBC WITH DIFFERENTIAL/PLATELET
Absolute Monocytes: 247 cells/uL (ref 200–950)
Basophils Absolute: 29 cells/uL (ref 0–200)
Basophils Relative: 1 %
Eosinophils Absolute: 90 cells/uL (ref 15–500)
Eosinophils Relative: 3.1 %
HCT: 38.9 % (ref 35.0–45.0)
Hemoglobin: 12.9 g/dL (ref 11.7–15.5)
Lymphs Abs: 783 cells/uL — ABNORMAL LOW (ref 850–3900)
MCH: 33.1 pg — ABNORMAL HIGH (ref 27.0–33.0)
MCHC: 33.2 g/dL (ref 32.0–36.0)
MCV: 99.7 fL (ref 80.0–100.0)
MPV: 10.1 fL (ref 7.5–12.5)
Monocytes Relative: 8.5 %
Neutro Abs: 1752 cells/uL (ref 1500–7800)
Neutrophils Relative %: 60.4 %
Platelets: 188 10*3/uL (ref 140–400)
RBC: 3.9 10*6/uL (ref 3.80–5.10)
RDW: 12.9 % (ref 11.0–15.0)
Total Lymphocyte: 27 %
WBC: 2.9 10*3/uL — ABNORMAL LOW (ref 3.8–10.8)

## 2019-02-10 LAB — TSH: TSH: 4.62 mIU/L — ABNORMAL HIGH

## 2019-02-10 LAB — LIPID PANEL
Cholesterol: 199 mg/dL (ref <200)
HDL: 55 mg/dL (ref >=50)
LDL Cholesterol (Calc): 123 mg/dL (calc) — ABNORMAL HIGH
Non-HDL Cholesterol (Calc): 144 mg/dL (calc) — ABNORMAL HIGH (ref <130)
Total CHOL/HDL Ratio: 3.6 (calc) (ref <5.0)
Triglycerides: 99 mg/dL (ref <150)

## 2019-02-10 MED ORDER — LEVOTHYROXINE SODIUM 88 MCG PO TABS: ORAL_TABLET | ORAL | 1 refills | Status: DC

## 2019-02-14 DIAGNOSIS — Z85048 Personal history of other malignant neoplasm of rectum, rectosigmoid junction, and anus: Secondary | ICD-10-CM | POA: Diagnosis not present

## 2019-02-14 DIAGNOSIS — C210 Malignant neoplasm of anus, unspecified: Secondary | ICD-10-CM | POA: Diagnosis not present

## 2019-02-14 DIAGNOSIS — Z08 Encounter for follow-up examination after completed treatment for malignant neoplasm: Secondary | ICD-10-CM | POA: Diagnosis not present

## 2019-02-15 ENCOUNTER — Inpatient Hospital Stay
Admission: RE | Admit: 2019-02-15 | Discharge: 2019-02-15 | Disposition: A | Discharge status: Home / Self Care | Payer: Self-pay | Source: Ambulatory Visit | Attending: Nurse Practitioner | Admitting: Nurse Practitioner

## 2019-02-15 ENCOUNTER — Other Ambulatory Visit: Payer: Self-pay | Admitting: Nurse Practitioner

## 2019-02-15 DIAGNOSIS — Z1231 Encounter for screening mammogram for malignant neoplasm of breast: Secondary | ICD-10-CM

## 2019-02-19 ENCOUNTER — Other Ambulatory Visit: Payer: Self-pay | Admitting: Family Medicine

## 2019-02-19 DIAGNOSIS — F339 Major depressive disorder, recurrent, unspecified: Secondary | ICD-10-CM

## 2019-03-02 DIAGNOSIS — L821 Other seborrheic keratosis: Secondary | ICD-10-CM | POA: Diagnosis not present

## 2019-03-02 DIAGNOSIS — L218 Other seborrheic dermatitis: Secondary | ICD-10-CM | POA: Diagnosis not present

## 2019-03-02 DIAGNOSIS — D229 Melanocytic nevi, unspecified: Secondary | ICD-10-CM | POA: Diagnosis not present

## 2019-04-17 ENCOUNTER — Ambulatory Visit (INDEPENDENT_AMBULATORY_CARE_PROVIDER_SITE_OTHER): Payer: Medicare Other | Admitting: Family Medicine

## 2019-04-17 ENCOUNTER — Other Ambulatory Visit: Payer: Self-pay

## 2019-04-17 ENCOUNTER — Encounter: Payer: Self-pay | Admitting: Family Medicine

## 2019-04-17 DIAGNOSIS — M7918 Myalgia, other site: Secondary | ICD-10-CM

## 2019-04-17 DIAGNOSIS — S46911A Strain of unspecified muscle, fascia and tendon at shoulder and upper arm level, right arm, initial encounter: Secondary | ICD-10-CM

## 2019-04-17 DIAGNOSIS — M5137 Other intervertebral disc degeneration, lumbosacral region: Secondary | ICD-10-CM | POA: Diagnosis not present

## 2019-04-17 DIAGNOSIS — E559 Vitamin D deficiency, unspecified: Secondary | ICD-10-CM

## 2019-04-17 DIAGNOSIS — E039 Hypothyroidism, unspecified: Secondary | ICD-10-CM

## 2019-04-17 MED ORDER — GABAPENTIN 300 MG PO CAPS: ORAL_CAPSULE | ORAL | 3 refills | Status: DC

## 2019-04-17 MED ORDER — CYCLOBENZAPRINE HCL 10 MG PO TABS: 10.0000 mg | ORAL_TABLET | Freq: Every evening | ORAL | 1 refills | Status: DC | PRN

## 2019-04-17 NOTE — Patient Instructions (Addendum)
Ordered Gabapentin refill  Start Cyclobenzapine (Flexeril) 10mg  tablets (muscle relaxant) - start with half (cut) to one whole pill at night for muscle relaxant - may make you sedated or sleepy (be careful driving or working on this) if tolerated you can take half to whole tab 2 to 3 times daily or every 8 hours as needed  Return to Dr Holley Raring for pain management - to discuss medications and treatments and x-rays  [facility] Address: [address], [place], [place] [address] Phone: [phone]  DUE for FASTING BLOOD WORK (no food or drink after midnight before the lab appointment, only water or coffee without cream/sugar on the morning of)  SCHEDULE "Lab Only" visit in the morning at the clinic for lab draw in 1-2 WEEKS   - Make sure Lab Only appointment is at about 1 week before your next appointment, so that results will be available  For Lab Results, once available within 2-3 days of blood draw, you can can log in to MyChart online to view your results and a brief explanation. Also, we can discuss results at next follow-up visit.    Please schedule a Follow-up Appointment to: Return in about 3 months (around 07/16/2019) for 3 month follow-up thyroid, pain, w/ new provider.  If you have any other questions or concerns, please feel free to call the office or send a message through [place]. You may also schedule an earlier appointment if necessary.  Additionally, you may be receiving a survey about your experience at our office within a few days to 1 week by e-mail or mail. We value your feedback.  [name], DO [place]

## 2019-04-17 NOTE — Progress Notes (Signed)
Virtual Visit via Telephone The purpose of this virtual visit is to provide medical care while limiting exposure to the novel coronavirus (COVID19) for both patient and office staff.  Consent was obtained for phone visit:  Yes.   Answered questions that patient had about telehealth interaction:  Yes.   I discussed the limitations, risks, security and privacy concerns of performing an evaluation and management service by telephone. I also discussed with the patient that there may be a patient responsible charge related to this service. The patient expressed understanding and agreed to proceed.  Patient Location: Home Provider Location: [facility])  ---------------------------------------------------------------------- Chief Complaint  Patient presents with  . Hypothyroidism  . Arm Pain    she complains of upper arm pain in the bicep & tricep area x 1 mth. The pt think she possible pulled a muscle.     S: Reviewed CMA documentation. I have called patient and gathered additional HPI as follows:  Hypothyroidism Chronic problem. On levothyroxine 4mcg daily. She still endorses some days with mood swings and hair loss. Request labs, last lab was elevated TSH in 02/2019  Chronic Pain / MSK Followed by [facility] Pain Dr Holley Raring - last visit 07/2018 has had injections in past x-rays, she says lost to follow up due to [place], now trying to get back on track. She needs refill Gabapentin 300mg  x 2 in AM and x 3 in pm. She also asks about Tramadol  Right Arm / Muscle Strain Recent mild injury but has tried OTC home remedy heating pad and cold packs with limited relief, usually only relief is a muscle relaxer. No other trauma or injury Denies numbness tingling weakness.  Denies any high risk travel to areas of current concern for COVID19. Denies any known or suspected exposure to person with or possibly with COVID19.  Denies any fevers, chills, sweats, body ache, cough, shortness  of breath, sinus pain or pressure, headache, abdominal pain, diarrhea  Past Medical History:  Diagnosis Date  . Arthritis   . CAD (coronary artery disease)    s/p stenting x 2  . Cancer ([place])    anus cancer  . Colon polyp   . Depression   . Hyperlipidemia   . MI (myocardial infarction) ([place])   . Thyroid disease   . Vitamin D deficiency    Social History   Tobacco Use  . Smoking status: Former Smoker    Years: 30.00    Quit date: 01/21/2007    Years since quitting: 12.2  . Smokeless tobacco: Never Used  Substance Use Topics  . Alcohol use: No  . Drug use: No    Current Outpatient Medications:  .  buPROPion (WELLBUTRIN XL) 150 MG 24 hr tablet, Take 1 tablet by mouth once daily, Disp: 90 tablet, Rfl: 1 .  cholecalciferol (VITAMIN D) 1000 units tablet, Take 1,000 Units by mouth daily., Disp: , Rfl:  .  Flaxseed, Linseed, (FLAX SEED OIL) 1000 MG CAPS, Take by mouth., Disp: , Rfl:  .  fluticasone (FLONASE) 50 MCG/ACT nasal spray, 1 spray by Each Nare route daily., Disp: , Rfl:  .  levothyroxine (SYNTHROID) 88 MCG tablet, TAKE 1 TABLET BY MOUTH ONCE DAILY BEFORE BREAKFAST, Disp: 90 tablet, Rfl: 1 .  Multiple Vitamin (MULTIVITAMIN) tablet, Take 1 tablet by mouth daily., Disp: , Rfl:  .  naproxen (NAPROSYN) 500 MG tablet, Take by mouth., Disp: , Rfl:  .  traMADol (ULTRAM) 50 MG tablet, TAKE ONE TABLET BY MOUTH TWICE A  DAY FOR moderate abdominal and rectal pain. (Patient taking differently: 50 mg. TAKE TWO TABLET BY MOUTH TWICE A DAY FOR moderate abdominal and rectal pain.PRN), Disp: 60 tablet, Rfl: 5 .  cyclobenzaprine (FLEXERIL) 10 MG tablet, Take 1 tablet (10 mg total) by mouth at bedtime as needed for muscle spasms., Disp: 30 tablet, Rfl: 1 .  gabapentin (NEURONTIN) 300 MG capsule, Take 2 capsules (600mg  dose) daily and take 3 capsules (900mg  dose) nightly, Disp: 150 capsule, Rfl: 3 .  nitroGLYCERIN (NITROSTAT) 0.4 MG SL tablet, Place 1 tablet (0.4 mg total) under the tongue every 5  (five) minutes as needed for chest pain. (Patient not taking: Reported on 01/31/2018), Disp: 100 tablet, Rfl: 3  Depression screen [facility] 2/9 01/27/2019 12/19/2018 06/30/2018  Decreased Interest 3 0 0  Down, Depressed, Hopeless 3 0 0  PHQ - 2 Score 6 0 0  Altered sleeping 0 0 -  Tired, decreased energy 0 0 -  Change in appetite 0 0 -  Feeling bad or failure about yourself  0 0 -  Trouble concentrating 2 0 -  Moving slowly or fidgety/restless 0 0 -  Suicidal thoughts 0 0 -  PHQ-9 Score 8 0 -  Difficult doing work/chores Somewhat difficult Not difficult at all -    GAD 7 : Generalized Anxiety Score 06/02/2018 05/05/2018  Nervous, Anxious, on Edge 1 0  Control/stop worrying 3 0  Worry too much - different things 3 0  Trouble relaxing 3 0  Restless 1 0  Easily annoyed or irritable 1 0  Afraid - awful might happen 1 1  Total GAD 7 Score 13 1  Anxiety Difficulty Somewhat difficult Not difficult at all    -------------------------------------------------------------------------- O: No physical exam performed due to remote telephone encounter.  Lab results reviewed.  Recent Results (from the past 2160 hour(s))  Comprehensive metabolic panel     Status: Abnormal   Collection Time: 02/09/19  9:52 AM  Result Value Ref Range   Glucose, Bld 100 (H) 65 - 99 mg/dL    Comment: .            Fasting reference interval . For someone without known diabetes, a glucose value between 100 and 125 mg/dL is consistent with prediabetes and should be confirmed with a follow-up test. .    BUN 17 7 - 25 mg/dL   Creat 0.87 0.50 - 1.05 mg/dL    Comment: For patients >72 years of age, the reference limit for Creatinine is approximately 13% higher for people identified as African-American. .    BUN/Creatinine Ratio NOT APPLICABLE 6 - 22 (calc)   Sodium 141 135 - 146 mmol/L   Potassium 4.8 3.5 - 5.3 mmol/L   Chloride 107 98 - 110 mmol/L   CO2 28 20 - 32 mmol/L   Calcium 9.1 8.6 - 10.4 mg/dL   Total  Protein 6.0 (L) 6.1 - 8.1 g/dL   Albumin 4.1 3.6 - 5.1 g/dL   Globulin 1.9 1.9 - 3.7 g/dL (calc)   AG Ratio 2.2 1.0 - 2.5 (calc)   Total Bilirubin 0.6 0.2 - 1.2 mg/dL   Alkaline phosphatase (APISO) 74 37 - 153 U/L   AST 18 10 - 35 U/L   ALT 12 6 - 29 U/L  CBC with Differential/Platelet     Status: Abnormal   Collection Time: 02/09/19  9:52 AM  Result Value Ref Range   WBC 2.9 (L) 3.8 - 10.8 Thousand/uL   RBC 3.90 3.80 - 5.10 Million/uL  Hemoglobin 12.9 11.7 - 15.5 g/dL   HCT 38.9 35.0 - 45.0 %   MCV 99.7 80.0 - 100.0 fL   MCH 33.1 (H) 27.0 - 33.0 pg   MCHC 33.2 32.0 - 36.0 g/dL   RDW 12.9 11.0 - 15.0 %   Platelets 188 140 - 400 Thousand/uL   MPV 10.1 7.5 - 12.5 fL   Neutro Abs 1,752 1,500 - 7,800 cells/uL   Lymphs Abs 783 (L) 850 - 3,900 cells/uL   Absolute Monocytes 247 200 - 950 cells/uL   Eosinophils Absolute 90 15 - 500 cells/uL   Basophils Absolute 29 0 - 200 cells/uL   Neutrophils Relative % 60.4 %   Total Lymphocyte 27.0 %   Monocytes Relative 8.5 %   Eosinophils Relative 3.1 %   Basophils Relative 1.0 %  Lipid panel     Status: Abnormal   Collection Time: 02/09/19  9:52 AM  Result Value Ref Range   Cholesterol 199 <200 mg/dL   HDL 55 > OR = 50 mg/dL   Triglycerides 99 <150 mg/dL   LDL Cholesterol (Calc) 123 (H) mg/dL (calc)    Comment: Reference range: <100 . Desirable range <100 mg/dL for primary prevention;   <70 mg/dL for patients with CHD or diabetic patients  with > or = 2 CHD risk factors. Marland Kitchen LDL-C is now calculated using the Martin-Hopkins  calculation, which is a validated novel method providing  better accuracy than the Friedewald equation in the  estimation of LDL-C.  Cresenciano Genre et al. Annamaria Helling. [id number]): 2061-2068  (http://education.[employer].com/faq/FAQ164)    Total CHOL/HDL Ratio 3.6 <5.0 (calc)   Non-HDL Cholesterol (Calc) 144 (H) <130 mg/dL (calc)    Comment: For patients with diabetes plus 1 major ASCVD risk  factor, treating to a  non-HDL-C goal of <100 mg/dL  (LDL-C of <70 mg/dL) is considered a therapeutic  option.   TSH     Status: Abnormal   Collection Time: 02/09/19  9:52 AM  Result Value Ref Range   TSH 4.62 (H) mIU/L    Comment:           Reference Range .           > or = 20 Years  0.40-4.50 .                Pregnancy Ranges           First trimester    0.26-2.66           Second trimester   0.55-2.73           Third trimester    0.43-2.91     -------------------------------------------------------------------------- A&P:  Problem List Items Addressed This Visit    Musculoskeletal pain   Relevant Medications   gabapentin (NEURONTIN) 300 MG capsule   cyclobenzaprine (FLEXERIL) 10 MG tablet   Hypovitaminosis D   Relevant Orders   [facility] - VITAMIN D 25 Hydroxy (Vit-D Deficiency, Fractures)   Hypothyroidism - Primary   Relevant Orders   [facility] - TSH   [facility] - T4, free   DDD (degenerative disc disease), lumbosacral   Relevant Medications   gabapentin (NEURONTIN) 300 MG capsule   cyclobenzaprine (FLEXERIL) 10 MG tablet    Other Visit Diagnoses    Muscle strain of right upper arm, initial encounter         #Hypothyroidism Last TSH mild elevated >4 in 02/2019 Clinicaly some symptoms of hypothyroidism On levothyroxine 28mcg daily Check Thyroid panel within 1-2 week, f/u results  #Vitamin  D deficiency Last lab 2019, mild low Recheck lab, and treat accordingly  #R arm upper muscle strain History without other complication or injury Seems muscle strain based on reported symptoms and prior occurrence Will treat with Flexeril 10mg  [facility] PRN - similar treatment in past with good results  #Chronic Pain  Followed by [facility] Pain management Dr Holley Raring, last 07/2018, advised her she should return to their office for further management procedure and rx controlled medications, discussed tramadol briefly - Agree to refill Gabapentin current dosage  Meds ordered this encounter  Medications  . gabapentin  (NEURONTIN) 300 MG capsule    Sig: Take 2 capsules (600mg  dose) daily and take 3 capsules (900mg  dose) nightly    Dispense:  150 capsule    Refill:  3  . cyclobenzaprine (FLEXERIL) 10 MG tablet    Sig: Take 1 tablet (10 mg total) by mouth at bedtime as needed for muscle spasms.    Dispense:  30 tablet    Refill:  1    Follow-up: - Return in 3 months for thyroid/pain w/ new provider - Future labs ordered for Thyroid panel Vitamin D within 1 week  Patient verbalizes understanding with the above medical recommendations including the limitation of remote medical advice.  Specific follow-up and call-back criteria were given for patient to follow-up or seek medical care more urgently if needed.   - Time spent in direct consultation with patient on phone: 11 minutes   [name], [facility] 04/17/2019, 8:53 AM

## 2019-05-22 ENCOUNTER — Other Ambulatory Visit: Payer: Self-pay | Admitting: Family Medicine

## 2019-05-22 DIAGNOSIS — E039 Hypothyroidism, unspecified: Secondary | ICD-10-CM

## 2019-05-23 MED ORDER — LEVOTHYROXINE SODIUM 88 MCG PO TABS: ORAL_TABLET | ORAL | 1 refills | Status: DC

## 2019-06-08 ENCOUNTER — Telehealth: Payer: Self-pay

## 2019-06-08 DIAGNOSIS — G894 Chronic pain syndrome: Secondary | ICD-10-CM

## 2019-06-08 MED ORDER — TRAMADOL HCL 50 MG PO TABS: 50.0000 mg | ORAL_TABLET | Freq: Two times a day (BID) | ORAL | 1 refills | Status: DC | PRN

## 2019-06-08 NOTE — Telephone Encounter (Signed)
Patient informed. 

## 2019-06-08 NOTE — Telephone Encounter (Signed)
Ordered new rx / refills Tramadol to [facility]  Checked PDMP  [name], DO [facility] 06/08/2019, 3:36 PM

## 2019-06-08 NOTE — Telephone Encounter (Signed)
Message from [place] called [phone] is requesting a call back said that pt is needed a refill on Tramadol states that Lauren DEA have expired. Confirmed with the pharmacy, patient was requesting medication refill.

## 2019-07-11 ENCOUNTER — Ambulatory Visit (INDEPENDENT_AMBULATORY_CARE_PROVIDER_SITE_OTHER): Payer: Medicare Other

## 2019-07-11 VITALS — Wt 163.0 lb

## 2019-07-11 DIAGNOSIS — Z0000 Encounter for general adult medical examination without abnormal findings: Secondary | ICD-10-CM | POA: Diagnosis not present

## 2019-07-11 NOTE — Progress Notes (Signed)
Subjective:   Brenda Grimes is a 54 y.o. female who presents for an Initial Medicare Annual Wellness Visit.  This visit is being conducted via phone call  - after an attmept to do on video chat - due to the COVID-19 pandemic. This patient has given me verbal consent via phone to conduct this visit, patient states they are participating from their home address. Some vital signs may be absent or patient reported.   Patient identification: identified by name, DOB, and current address.    Review of Systems     Cardiac Risk Factors include: advanced age (>41men, >82 women)     Objective:    Today's Vitals   07/11/19 0920  Weight: 163 lb (73.9 kg)  PainSc: 8    Body mass index is 27.98 kg/m.  Advanced Directives 07/11/2019 02/14/2018 03/23/2017  Does Patient Have a Medical Advance Directive? No No No    Current Medications (verified) Outpatient Encounter Medications as of 07/11/2019  Medication Sig  . buPROPion (WELLBUTRIN XL) 150 MG 24 hr tablet Take 1 tablet by mouth once daily  . cholecalciferol (VITAMIN D) 1000 units tablet Take 1,000 Units by mouth daily.  . cyclobenzaprine (FLEXERIL) 10 MG tablet Take 1 tablet (10 mg total) by mouth at bedtime as needed for muscle spasms.  . Flaxseed, Linseed, (FLAX SEED OIL) 1000 MG CAPS Take by mouth.  . fluticasone (FLONASE) 50 MCG/ACT nasal spray 1 spray by Each Nare route daily.  Marland Kitchen gabapentin (NEURONTIN) 300 MG capsule Take 2 capsules (600mg  dose) daily and take 3 capsules (900mg  dose) nightly  . levothyroxine (SYNTHROID) 88 MCG tablet TAKE 1 TABLET BY MOUTH ONCE DAILY BEFORE BREAKFAST  . Multiple Vitamin (MULTIVITAMIN) tablet Take 1 tablet by mouth daily.  . naproxen (NAPROSYN) 500 MG tablet Take by mouth.  . traMADol (ULTRAM) 50 MG tablet Take 1 tablet (50 mg total) by mouth 2 (two) times daily as needed for moderate pain (abdominal or rectal pain).  . nitroGLYCERIN (NITROSTAT) 0.4 MG SL tablet Place 1 tablet (0.4 mg total) under the  tongue every 5 (five) minutes as needed for chest pain. (Patient not taking: Reported on 01/31/2018)   No facility-administered encounter medications on file as of 07/11/2019.    Allergies (verified) Sulfa antibiotics, Sulfasalazine, and Tape   History: Past Medical History:  Diagnosis Date  . Arthritis   . CAD (coronary artery disease)    s/p stenting x 2  . Cancer ([place])    anus cancer  . Colon polyp   . Depression   . Hyperlipidemia   . MI (myocardial infarction) ([place])   . Thyroid disease   . Vitamin D deficiency    Past Surgical History:  Procedure Laterality Date  . CARDIAC SURGERY    . CORONARY ANGIOPLASTY WITH STENT PLACEMENT    . TUBAL LIGATION     Family History  Problem Relation Age of Onset  . Alzheimer's disease Mother   . CAD Father   . Stroke Brother   . Alzheimer's disease Maternal Grandmother   . Thyroid disease Brother   . Hyperlipidemia Brother   . Hyperlipidemia Brother   . Alcohol abuse Brother   . Healthy Daughter   . Healthy Son   . Heart disease Paternal Uncle   . Heart disease Paternal Grandmother   . Heart disease Paternal Grandfather   . Thyroid disease Brother   . Cirrhosis Brother   . Healthy Son   . Breast cancer Cousin    Social History  Socioeconomic History  . Marital status: Married    Spouse name: Not on file  . Number of children: Not on file  . Years of education: Not on file  . Highest education level: Not on file  Occupational History  . Not on file  Tobacco Use  . Smoking status: Former Smoker    Packs/day: 0.75    Years: 30.00    Pack years: 22.50    Quit date: 01/21/2007    Years since quitting: 12.4  . Smokeless tobacco: Never Used  Substance and Sexual Activity  . Alcohol use: No  . Drug use: No  . Sexual activity: Not on file  Other Topics Concern  . Not on file  Social History Narrative  . Not on file   Social Determinants of Health   Financial Resource Strain:   . Difficulty of Paying Living  Expenses: Not on file  Food Insecurity:   . Worried About Charity fundraiser in the Last Year: Not on file  . Ran Out of Food in the Last Year: Not on file  Transportation Needs:   . Lack of Transportation (Medical): Not on file  . Lack of Transportation (Non-Medical): Not on file  Physical Activity:   . Days of Exercise per Week: Not on file  . Minutes of Exercise per Session: Not on file  Stress:   . Feeling of Stress : Not on file  Social Connections:   . Frequency of Communication with Friends and Family: Not on file  . Frequency of Social Gatherings with Friends and Family: Not on file  . Attends Religious Services: Not on file  . Active Member of Clubs or Organizations: Not on file  . Attends Archivist Meetings: Not on file  . Marital Status: Not on file    Tobacco Counseling Counseling given: Not Answered   Clinical Intake:  Pre-visit preparation completed: Yes  Pain : 0-10 Pain Score: 8  Pain Type: Chronic pain Pain Location: Arm(arms and hips) Pain Orientation: Left, Right Pain Descriptors / Indicators: Aching Pain Onset: More than a month ago Pain Frequency: Constant Pain Relieving Factors: tramadol  Pain Relieving Factors: tramadol  Nutritional Status: BMI 25 -29 Overweight Nutritional Risks: None Diabetes: No  How often do you need to have someone help you when you read instructions, pamphlets, or other written materials from your doctor or pharmacy?: 1 - Never  Interpreter Needed?: No  Information entered by :: Tyler Aas, LPN   Activities of Daily Living In your present state of health, do you have any difficulty performing the following activities: 07/11/2019 01/27/2019  Hearing? Y N  Comment no hearing aids -  Vision? Y Y  Comment eyeglasses, [facility] -  Difficulty concentrating or making decisions? N Y  Walking or climbing stairs? N Y  Comment - bilateral hip pain  Dressing or bathing? N N  Doing errands, shopping? N N   Preparing Food and eating ? N -  Using the Toilet? N -  In the past six months, have you accidently leaked urine? Y -  Do you have problems with loss of bowel control? Y -  Managing your Medications? N -  Managing your Finances? N -  Housekeeping or managing your Housekeeping? N -  Some recent data might be hidden     Immunizations and Health Maintenance Immunization History  Administered Date(s) Administered  . Influenza, Seasonal, Injecte, Preservative Fre 03/28/2013  . Influenza,inj,Quad PF,6+ Mos 01/15/2014, 01/31/2015, 04/02/2016, 01/21/2017, 01/31/2018, 01/27/2019  .  Influenza-Unspecified 01/15/2014, 01/31/2015, 04/02/2016, 01/21/2017   Health Maintenance Due  Topic Date Due  . Hepatitis C Screening  10-25-1965  . HIV Screening  03/20/1979  . TETANUS/TDAP  03/20/1983  . PAP SMEAR-Modifier  03/19/1985    Patient Care Team: Malfi, Lupita Raider, FNP as PCP - General (Family Medicine)  Indicate any recent Medical Services you may have received from other than [facility] providers in the past year (date may be approximate).     Assessment:   This is a routine wellness examination for Zyra.  Hearing/Vision screen  Hearing Screening   125Hz  250Hz  500Hz  1000Hz  2000Hz  3000Hz  4000Hz  6000Hz  8000Hz   Right ear:           Left ear:           Vision Screening Comments: Dr.shade in [place]   Dietary issues and exercise activities discussed: Current Exercise Habits: The patient has a physically strenuous job, but has no regular exercise apart from work., Exercise limited by: None identified  Goals Addressed   None    Depression Screen PHQ 2/9 Scores 07/11/2019 01/27/2019 12/19/2018 06/30/2018 06/02/2018 05/05/2018 01/20/2018  PHQ - 2 Score 0 6 0 0 3 1 0  PHQ- 9 Score - 8 0 - 19 13 3     Fall Risk Fall Risk  07/11/2019 06/30/2018 06/02/2018  Falls in the past year? 1 0 0  Number falls in past yr: 0 - 0  Injury with Fall? 0 - -  Follow up - - Falls evaluation completed   [place]:  Any stairs in or around the home? No  If so, are there any without handrails? No   Home free of loose throw rugs in walkways, pet beds, electrical cords, etc? Yes  Adequate lighting in your home to reduce risk of falls? Yes   ASSISTIVE DEVICES UTILIZED TO PREVENT FALLS:  Life alert? No  Use of a cane, walker or w/c? No  Grab bars in the bathroom? No  Shower chair or bench in shower? No  Elevated toilet seat or a handicapped toilet? No    DME ORDERS:  DME order needed?  No   TIMED UP AND GO:  Unable to perform     Cognitive Function:        Screening Tests Health Maintenance  Topic Date Due  . Hepatitis C Screening  [birth year]  . HIV Screening  03/20/1979  . TETANUS/TDAP  03/20/1983  . PAP SMEAR-Modifier  03/19/1985  . MAMMOGRAM  02/08/2021  . COLONOSCOPY  03/31/2027  . INFLUENZA VACCINE  Completed    Qualifies for Shingles Vaccine? Yes  Zostavax completed n/a. Due for Shingrix. Education has been provided regarding the importance of this vaccine. Pt has been advised to call insurance company to determine out of pocket expense. Advised may also receive vaccine at local pharmacy or Health Dept. Verbalized acceptance and understanding.   Tdap: Discussed need for TD/TDAP vaccine, patient verbalized understanding that this is not covered as a preventative with there insurance and to call the office if he develops any new skin injuries, ie: cuts, scrapes, bug bites, or open wounds..  Flu Vaccine: up to date   Pneumococcal Vaccine: not indicated    Covid-19 Vaccine: not yet indicated   Cancer Screenings:  Colorectal Screening: Completed 03/30/2017 Repeat every 5 years;  Mammogram: Completed 02/09/2019 . Repeat every year  Bone Density: not indicated   Lung Cancer Screening: (Low Dose CT Chest recommended if Age 67-80 years, 30 pack-year  currently smoking OR have quit w/in 15years.) does not qualify.    Additional Screening:   Hepatitis C Screening: does qualify  Vision Screening: Recommended annual ophthalmology exams for early detection of glaucoma and other disorders of the eye. Is the patient up to date with their annual eye exam?  Yes  Who is the provider or what is the name of the office in which the pt attends annual eye exams? [facility] in [place]: Recommended annual dental exams for proper oral hygiene  Community Resource Referral:  CRR required this visit?  No       Plan:  I have personally reviewed and addressed the Medicare Annual Wellness questionnaire and have noted the following in the patient's chart:  A. Medical and social history B. Use of alcohol, tobacco or illicit drugs  C. Current medications and supplements D. Functional ability and status E.  Nutritional status F.  Physical activity G. Advance directives H. List of other physicians I.  Hospitalizations, surgeries, and ER visits in previous 12 months J.  [place] such as hearing and vision if needed, cognitive and depression L. Referrals and appointments   In addition, I have reviewed and discussed with patient certain preventive protocols, quality metrics, and best practice recommendations. A written personalized care plan for preventive services as well as general preventive health recommendations were provided to patient.   Signed,    Bevelyn Ngo, LPN   [id number]  Nurse Health Advisor   Nurse Notes: requesting referral to OBGYN, hasnt been able to have pap smear since radiation for anal cancer.

## 2019-07-11 NOTE — Patient Instructions (Addendum)
Brenda Grimes , Thank you for taking time to come for your Medicare Wellness Visit. I appreciate your ongoing commitment to your health goals. Please review the following plan we discussed and let me know if I can assist you in the future.   Screening recommendations/referrals: Colonoscopy: completed 2018 Mammogram: completed 02/2019 Bone Density: not indicated  Recommended yearly ophthalmology/optometry visit for glaucoma screening and checkup Recommended yearly dental visit for hygiene and checkup  Vaccinations: Influenza vaccine: up to date  Pneumococcal vaccine: not indicated  Tdap vaccine:due now  Shingles vaccine: shingrix eligible   Covid-19: not eligible yet   Advanced directives: Advance directive discussed with you today.. Once this is complete please bring a copy in to our office so we can scan it into your chart.  Conditions/risks identified: discussed continuing exercise.   Next appointment: Follow up in one year for your annual wellness visit   Preventive Care 40-64 Years, Female Preventive care refers to lifestyle choices and visits with your health care provider that can promote health and wellness. What does preventive care include?  A yearly physical exam. This is also called an annual well check.  Dental exams once or twice a year.  Routine eye exams. Ask your health care provider how often you should have your eyes checked.  Personal lifestyle choices, including:  Daily care of your teeth and gums.  Regular physical activity.  Eating a healthy diet.  Avoiding tobacco and drug use.  Limiting alcohol use.  Practicing safe sex.  Taking low-dose aspirin daily starting at age 80.  Taking vitamin and mineral supplements as recommended by your health care provider. What happens during an annual well check? The services and screenings done by your health care provider during your annual well check will depend on your age, overall health, lifestyle risk  factors, and family history of disease. Counseling  Your health care provider may ask you questions about your:  Alcohol use.  Tobacco use.  Drug use.  Emotional well-being.  Home and relationship well-being.  Sexual activity.  Eating habits.  Work and work Statistician.  Method of birth control.  Menstrual cycle.  Pregnancy history. Screening  You may have the following tests or measurements:  Height, weight, and BMI.  Blood pressure.  Lipid and cholesterol levels. These may be checked every 5 years, or more frequently if you are over 91 years old.  Skin check.  Lung cancer screening. You may have this screening every year starting at age 58 if you have a 30-pack-year history of smoking and currently smoke or have quit within the past 15 years.  Fecal occult blood test (FOBT) of the stool. You may have this test every year starting at age 62.  Flexible sigmoidoscopy or colonoscopy. You may have a sigmoidoscopy every 5 years or a colonoscopy every 10 years starting at age 53.  Hepatitis C blood test.  Hepatitis B blood test.  Sexually transmitted disease (STD) testing.  Diabetes screening. This is done by checking your blood sugar (glucose) after you have not eaten for a while (fasting). You may have this done every 1-3 years.  Mammogram. This may be done every 1-2 years. Talk to your health care provider about when you should start having regular mammograms. This may depend on whether you have a family history of breast cancer.  BRCA-related cancer screening. This may be done if you have a family history of breast, ovarian, tubal, or peritoneal cancers.  Pelvic exam and Pap test. This may be done  every 3 years starting at age 93. Starting at age 28, this may be done every 5 years if you have a Pap test in combination with an HPV test.  Bone density scan. This is done to screen for osteoporosis. You may have this scan if you are at high risk for  osteoporosis. Discuss your test results, treatment options, and if necessary, the need for more tests with your health care provider. Vaccines  Your health care provider may recommend certain vaccines, such as:  Influenza vaccine. This is recommended every year.  Tetanus, diphtheria, and acellular pertussis (Tdap, Td) vaccine. You may need a Td booster every 10 years.  Zoster vaccine. You may need this after age 42.  Pneumococcal 13-valent conjugate (PCV13) vaccine. You may need this if you have certain conditions and were not previously vaccinated.  Pneumococcal polysaccharide (PPSV23) vaccine. You may need one or two doses if you smoke cigarettes or if you have certain conditions. Talk to your health care provider about which screenings and vaccines you need and how often you need them. This information is not intended to replace advice given to you by your health care provider. Make sure you discuss any questions you have with your health care provider. Document Released: 05/17/2015 Document Revised: 01/08/2016 Document Reviewed: 02/19/2015 Elsevier Interactive Patient Education  2017 [place] Prevention in the Home Falls can cause injuries. They can happen to people of all ages. There are many things you can do to make your home safe and to help prevent falls. What can I do on the outside of my home?  Regularly fix the edges of walkways and driveways and fix any cracks.  Remove anything that might make you trip as you walk through a door, such as a raised step or threshold.  Trim any bushes or trees on the path to your home.  Use bright outdoor lighting.  Clear any walking paths of anything that might make someone trip, such as rocks or tools.  Regularly check to see if handrails are loose or broken. Make sure that both sides of any steps have handrails.  Any raised decks and porches should have guardrails on the edges.  Have any leaves, snow, or ice cleared  regularly.  Use sand or salt on walking paths during winter.  Clean up any spills in your garage right away. This includes oil or grease spills. What can I do in the bathroom?  Use night lights.  Install grab bars by the toilet and in the tub and shower. Do not use towel bars as grab bars.  Use non-skid mats or decals in the tub or shower.  If you need to sit down in the shower, use a plastic, non-slip stool.  Keep the floor dry. Clean up any water that spills on the floor as soon as it happens.  Remove soap buildup in the tub or shower regularly.  Attach bath mats securely with double-sided non-slip rug tape.  Do not have throw rugs and other things on the floor that can make you trip. What can I do in the bedroom?  Use night lights.  Make sure that you have a light by your bed that is easy to reach.  Do not use any sheets or blankets that are too big for your bed. They should not hang down onto the floor.  Have a firm chair that has side arms. You can use this for support while you get dressed.  Do not have  throw rugs and other things on the floor that can make you trip. What can I do in the kitchen?  Clean up any spills right away.  Avoid walking on wet floors.  Keep items that you use a lot in easy-to-reach places.  If you need to reach something above you, use a strong step stool that has a grab bar.  Keep electrical cords out of the way.  Do not use floor polish or wax that makes floors slippery. If you must use wax, use non-skid floor wax.  Do not have throw rugs and other things on the floor that can make you trip. What can I do with my stairs?  Do not leave any items on the stairs.  Make sure that there are handrails on both sides of the stairs and use them. Fix handrails that are broken or loose. Make sure that handrails are as long as the stairways.  Check any carpeting to make sure that it is firmly attached to the stairs. Fix any carpet that is loose  or worn.  Avoid having throw rugs at the top or bottom of the stairs. If you do have throw rugs, attach them to the floor with carpet tape.  Make sure that you have a light switch at the top of the stairs and the bottom of the stairs. If you do not have them, ask someone to add them for you. What else can I do to help prevent falls?  Wear shoes that:  Do not have high heels.  Have rubber bottoms.  Are comfortable and fit you well.  Are closed at the toe. Do not wear sandals.  If you use a stepladder:  Make sure that it is fully opened. Do not climb a closed stepladder.  Make sure that both sides of the stepladder are locked into place.  Ask someone to hold it for you, if possible.  Clearly mark and make sure that you can see:  Any grab bars or handrails.  First and last steps.  Where the edge of each step is.  Use tools that help you move around (mobility aids) if they are needed. These include:  Canes.  Walkers.  Scooters.  Crutches.  Turn on the lights when you go into a dark area. Replace any light bulbs as soon as they burn out.  Set up your furniture so you have a clear path. Avoid moving your furniture around.  If any of your floors are uneven, fix them.  If there are any pets around you, be aware of where they are.  Review your medicines with your doctor. Some medicines can make you feel dizzy. This can increase your chance of falling. Ask your doctor what other things that you can do to help prevent falls. This information is not intended to replace advice given to you by your health care provider. Make sure you discuss any questions you have with your health care provider. Document Released: 02/14/2009 Document Revised: 09/26/2015 Document Reviewed: 05/25/2014 Elsevier Interactive Patient Education  2017 [employer].

## 2019-07-17 ENCOUNTER — Ambulatory Visit: Payer: Medicare Other | Admitting: Family Medicine

## 2019-08-16 DIAGNOSIS — Z20828 Contact with and (suspected) exposure to other viral communicable diseases: Secondary | ICD-10-CM | POA: Diagnosis not present

## 2019-08-16 DIAGNOSIS — Z03818 Encounter for observation for suspected exposure to other biological agents ruled out: Secondary | ICD-10-CM | POA: Diagnosis not present

## 2019-08-18 ENCOUNTER — Other Ambulatory Visit: Payer: Self-pay | Admitting: Nurse Practitioner

## 2019-08-18 DIAGNOSIS — Z20828 Contact with and (suspected) exposure to other viral communicable diseases: Secondary | ICD-10-CM | POA: Diagnosis not present

## 2019-08-18 DIAGNOSIS — Z03818 Encounter for observation for suspected exposure to other biological agents ruled out: Secondary | ICD-10-CM | POA: Diagnosis not present

## 2019-08-18 DIAGNOSIS — E039 Hypothyroidism, unspecified: Secondary | ICD-10-CM

## 2019-08-18 NOTE — Telephone Encounter (Signed)
Requested Prescriptions  Pending Prescriptions Disp Refills  . levothyroxine (SYNTHROID) 88 MCG tablet [Pharmacy Med Name: Levothyroxine Sodium 88 MCG Oral Tablet] 90 tablet 0    Sig: TAKE 1 TABLET BY MOUTH ONCE DAILY BEFORE BREAKFAST     Endocrinology:  Hypothyroid Agents Failed - 08/18/2019  7:46 AM      Failed - TSH needs to be rechecked within 3 months after an abnormal result. Refill until TSH is due.      Failed - TSH in normal range and within 360 days    TSH  Date Value Ref Range Status  02/09/2019 4.62 (H) mIU/L Final    Comment:              Reference Range .           > or = 20 Years  0.40-4.50 .                Pregnancy Ranges           First trimester    0.26-2.66           Second trimester   0.55-2.73           Third trimester    0.43-2.91          Passed - Valid encounter within last 12 months    Recent Outpatient Visits          4 months ago Acquired hypothyroidism   [place], DO   6 months ago Need for immunization against influenza   [facility] Merrilyn Puma, Jerrel Ivory, NP   8 months ago Acquired hypothyroidism   [facility] Mikey College, NP   1 year ago Chronic pain due to neoplasm   [facility] Mikey College, NP   1 year ago [facility] Merrilyn Puma, Jerrel Ivory, NP

## 2019-09-03 ENCOUNTER — Other Ambulatory Visit: Payer: Self-pay | Admitting: Family Medicine

## 2019-09-03 DIAGNOSIS — F339 Major depressive disorder, recurrent, unspecified: Secondary | ICD-10-CM

## 2019-09-03 NOTE — Telephone Encounter (Signed)
Requested Prescriptions  Pending Prescriptions Disp Refills  . buPROPion (WELLBUTRIN XL) 150 MG 24 hr tablet [Pharmacy Med Name: buPROPion HCl ER (XL) 150 MG Oral Tablet Extended Release 24 Hour] 90 tablet 0    Sig: Take 1 tablet by mouth once daily     Psychiatry: Antidepressants - bupropion Passed - 09/03/2019 11:41 AM      Passed - Completed PHQ-2 or PHQ-9 in the last 360 days.      Passed - Last BP in normal range    BP Readings from Last 1 Encounters:  01/27/19 108/61         Passed - Valid encounter within last 6 months    Recent Outpatient Visits          4 months ago Acquired hypothyroidism   [place], DO   7 months ago Need for immunization against influenza   [facility] Merrilyn Puma, Jerrel Ivory, NP   8 months ago Acquired hypothyroidism   [facility] Mikey College, NP   1 year ago Chronic pain due to neoplasm   [facility] Mikey College, NP   1 year ago [facility] Merrilyn Puma, Jerrel Ivory, NP

## 2019-09-07 ENCOUNTER — Ambulatory Visit (INDEPENDENT_AMBULATORY_CARE_PROVIDER_SITE_OTHER): Payer: Medicare Other | Admitting: Family Medicine

## 2019-09-07 ENCOUNTER — Encounter: Payer: Self-pay | Admitting: Family Medicine

## 2019-09-07 ENCOUNTER — Other Ambulatory Visit: Payer: Self-pay

## 2019-09-07 VITALS — BP 116/50 | HR 80 | Temp 97.1°F | Ht 64.0 in | Wt 170.2 lb

## 2019-09-07 DIAGNOSIS — E785 Hyperlipidemia, unspecified: Secondary | ICD-10-CM | POA: Diagnosis not present

## 2019-09-07 DIAGNOSIS — M75101 Unspecified rotator cuff tear or rupture of right shoulder, not specified as traumatic: Secondary | ICD-10-CM | POA: Diagnosis not present

## 2019-09-07 DIAGNOSIS — M67911 Unspecified disorder of synovium and tendon, right shoulder: Secondary | ICD-10-CM

## 2019-09-07 DIAGNOSIS — R310 Gross hematuria: Secondary | ICD-10-CM | POA: Diagnosis not present

## 2019-09-07 DIAGNOSIS — N950 Postmenopausal bleeding: Secondary | ICD-10-CM | POA: Insufficient documentation

## 2019-09-07 DIAGNOSIS — R82998 Other abnormal findings in urine: Secondary | ICD-10-CM | POA: Diagnosis not present

## 2019-09-07 DIAGNOSIS — E039 Hypothyroidism, unspecified: Secondary | ICD-10-CM | POA: Diagnosis not present

## 2019-09-07 LAB — POCT URINALYSIS DIPSTICK
Bilirubin, UA: NEGATIVE
Blood, UA: NEGATIVE
Glucose, UA: NEGATIVE
Ketones, UA: NEGATIVE
Nitrite, UA: NEGATIVE
Protein, UA: NEGATIVE
Spec Grav, UA: 1.015 (ref 1.010–1.025)
Urobilinogen, UA: 0.2 E.U./dL
pH, UA: 7 (ref 5.0–8.0)

## 2019-09-07 NOTE — Patient Instructions (Addendum)
As we discussed, I have put in an order for a pelvic ultrasound that is transvaginal.    I have also put in referrals to OB/GYN and Orthopedics.  The referral coordinator will contact you within the next week to schedule this.  Contact us if you have not heard back from anyone about scheduling within the next week.  The GYN provider will be able to utilize the results from the ultrasound as well as be able to complete your PAP testing.  The Orthopedic provider will more than likely order an MRI for probable right rotator cuff tear.  As we discussed, have your labs drawn in the next 1-2 weeks and we will contact you with the results.  We will plan to see you back in 6 months for your physical  You will receive a survey after today's visit either digitally by e-mail or paper by [place] mail. Your experiences and feedback matter to [place].  Please respond so we know how we are doing as we provide care for you.  Call us with any questions/concerns/needs.  It is my goal to be available to you for your health concerns.  Thanks for choosing me to be a partner in your healthcare needs!  [name], FNP-C Family Nurse Practitioner [facility] Phone: [phone]

## 2019-09-07 NOTE — Progress Notes (Signed)
Subjective:    Patient ID: Brenda Grimes, female    DOB: 1966/03/18, 54 y.o.   MRN: [id number]  Brenda Grimes is a 54 y.o. female presenting on 09/07/2019 for Vaginal Bleeding (pt state that shes been postmenopausal x 6 yrs and notice x 3 days ago  heavy vaginal bleeding after urination when she wiped. She denies dysuria, urinary frequency or urgency. She said after she wiped she notice the blood even drip for a few seconds and subsided.  She reports that she had been taking Ibuprofen for allergy headache. Pt concern back of her history of rectal cancer. ) and Shoulder Pain (intermittent Rt side shoulder pain, that worsen with certain movements x 34mth)   HPI  [name] presents to clinic for evaluation of postmenopausal bleeding x 6 years.  Reports has been having vaginal bleeding x 3 days, has noticed the bleeding on her toilet tissue and dripping into the toilet.  Denies urinary urgency, frequency, dysuria, feeling of incomplete emptying or hesitancy.  Denies any dizziness, lightheadedness, visual changes, palpitations, abdominal pain, n/v/d.  [name] also reports having right shoulder pain x 6 months.  Was moving heavy rocks in her back yard and since then has had loss of ROM, loss of strength in right arm and inability to sleep on right side.  Has not found anything that has made her symptoms better.  Depression screen [facility] 2/9 09/07/2019 07/11/2019 01/27/2019  Decreased Interest 0 0 3  Down, Depressed, Hopeless 0 0 3  PHQ - 2 Score 0 0 6  Altered sleeping 0 - 0  Tired, decreased energy 0 - 0  Change in appetite 0 - 0  Feeling bad or failure about yourself  0 - 0  Trouble concentrating 1 - 2  Moving slowly or fidgety/restless 0 - 0  Suicidal thoughts 0 - 0  PHQ-9 Score 1 - 8  Difficult doing work/chores Not difficult at all - Somewhat difficult    Social History   Tobacco Use  . Smoking status: Former Smoker    Packs/day: 0.75    Years: 30.00    Pack years: 22.50    Quit date:  01/21/2007    Years since quitting: 12.6  . Smokeless tobacco: Never Used  Substance Use Topics  . Alcohol use: No  . Drug use: No    Review of Systems  Constitutional: Negative.   HENT: Negative.   Eyes: Negative.   Respiratory: Negative.   Cardiovascular: Negative.   Gastrointestinal: Negative.   Endocrine: Negative.   Genitourinary: Positive for vaginal bleeding. Negative for decreased urine volume, difficulty urinating, dyspareunia, dysuria, enuresis, flank pain, frequency, genital sores, hematuria, pelvic pain, urgency, vaginal discharge and vaginal pain.  Musculoskeletal: Positive for arthralgias and myalgias. Negative for back pain, gait problem, joint swelling, neck pain and neck stiffness.  Skin: Negative.   Allergic/Immunologic: Negative.   Neurological: Negative.   Hematological: Negative.   Psychiatric/Behavioral: Negative.    Per HPI unless specifically indicated above     Objective:    BP (!) 116/50 (BP Location: Left Arm, Patient Position: Sitting, Cuff Size: Normal)   Pulse 80   Temp (!) 97.1 F (36.2 C) (Temporal)   Ht 5\' 4"  (1.626 m)   Wt 170 lb 3.2 oz (77.2 kg)   BMI 29.21 kg/m   Wt Readings from Last 3 Encounters:  09/07/19 170 lb 3.2 oz (77.2 kg)  07/11/19 163 lb (73.9 kg)  01/27/19 172 lb (78 kg)    Physical Exam Vitals reviewed.  Constitutional:      General: She is not in acute distress.    Appearance: Normal appearance. She is well-developed, well-groomed and overweight. She is not ill-appearing or toxic-appearing.  HENT:     Head: Normocephalic.     Nose:     Comments: Facemask in place covering nose and mouth Eyes:     General: Lids are normal. Vision grossly intact.        Right eye: No discharge.        Left eye: No discharge.     Extraocular Movements: Extraocular movements intact.     Conjunctiva/sclera: Conjunctivae normal.     Pupils: Pupils are equal, round, and reactive to light.  Cardiovascular:     Rate and Rhythm: Normal  rate and regular rhythm.     Pulses: Normal pulses.          Dorsalis pedis pulses are 2+ on the right side and 2+ on the left side.       Posterior tibial pulses are 2+ on the right side and 2+ on the left side.     Heart sounds: Normal heart sounds. No murmur. No friction rub. No gallop.   Pulmonary:     Effort: Pulmonary effort is normal. No respiratory distress.     Breath sounds: Normal breath sounds.  Abdominal:     General: Abdomen is flat. Bowel sounds are normal. There is no distension.     Palpations: Abdomen is soft. There is no mass.     Tenderness: There is no abdominal tenderness. There is no guarding or rebound.     Hernia: No hernia is present.  Musculoskeletal:        General: Tenderness present. No swelling.     Right shoulder: Tenderness present. No swelling, deformity, effusion, laceration or crepitus. Decreased range of motion. Decreased strength. Normal pulse.     Left shoulder: Normal.     Right lower leg: No edema.     Left lower leg: No edema.  Feet:     Right foot:     Skin integrity: Skin integrity normal.     Left foot:     Skin integrity: Skin integrity normal.  Skin:    General: Skin is warm and dry.     Capillary Refill: Capillary refill takes less than 2 seconds.  Neurological:     General: No focal deficit present.     Mental Status: She is alert and oriented to person, place, and time.     Cranial Nerves: No cranial nerve deficit.     Sensory: No sensory deficit.     Motor: No weakness.     Coordination: Coordination normal.     Gait: Gait normal.  Psychiatric:        Attention and Perception: Attention and perception normal.        Mood and Affect: Mood and affect normal.        Speech: Speech normal.        Behavior: Behavior normal. Behavior is cooperative.        Thought Content: Thought content normal.        Cognition and Memory: Cognition and memory normal.        Judgment: Judgment normal.     Results for orders placed or  performed in visit on 09/07/19  POCT Urinalysis Dipstick  Result Value Ref Range   Color, UA yellow    Clarity, UA clear    Glucose, UA Negative Negative   Bilirubin, UA  negative    Ketones, UA negative    Spec Grav, UA 1.015 1.010 - 1.025   Blood, UA negative    pH, UA 7.0 5.0 - 8.0   Protein, UA Negative Negative   Urobilinogen, UA 0.2 0.2 or 1.0 E.U./dL   Nitrite, UA negative    Leukocytes, UA Trace (A) Negative   Appearance     Odor        Assessment & Plan:   Problem List Items Addressed This Visit      Endocrine   Hypothyroidism   Relevant Orders   Thyroid Panel With TSH     Musculoskeletal and Integument   Rotator cuff syndrome of right shoulder    Right rotator cuff syndrome, likely right rotator cuff tear.  Reports approx 6 months ago was lifting heavy rocks in the yard and has been having pain, loss of ROM, inability to sleep on her right side since.  Decreased ROM on exam today with 4/5 strength in right arm with testing.  Discussed with patient likely will need MRI and Orthopedics will be able to order as well as manage treatment plan.  1. Referral to Orthopedics        Other   Dyslipidemia   Relevant Orders   Lipid Profile   Postmenopausal vaginal bleeding    Postmenopausal x 6 years with 3 day history of heavy vaginal bleeding.  POCT U/A without hematuria in office but with leukocytes.  Sending for culture.  Discussed referral for OB/GYN due to inability to complete PAP testing in our office due to patient discomfort in the past and with new onset of vaginal bleeding.  Will order ultrasound so patient will have completed prior to meeting with OB/GYN.  Plan: 1. Transvaginal pelvic ultrasound ordered 2. Referral to OB/GYN placed for establishment, PAP testing and evaluation of post-menopausal bleeding.      Relevant Orders   US Pelvic Complete With Transvaginal   Ambulatory referral to Obstetrics / Gynecology   CBC with Differential   COMPLETE METABOLIC  PANEL WITH GFR    Other Visit Diagnoses    Gross hematuria    -  Primary   Relevant Orders   POCT Urinalysis Dipstick (Completed)   Disorder of right rotator cuff       Relevant Orders   AMB referral to orthopedics      No orders of the defined types were placed in this encounter.     Follow up plan: Return in about 6 months (around 03/09/2020) for CPE.   [name], [place] Family Nurse Practitioner [facility] 09/07/2019, 9:34 AM

## 2019-09-07 NOTE — Addendum Note (Signed)
Addended by: Wilson Singer on: 09/07/2019 10:36 AM   Modules accepted: Orders

## 2019-09-07 NOTE — Assessment & Plan Note (Signed)
Postmenopausal x 6 years with 3 day history of heavy vaginal bleeding.  POCT U/A without hematuria in office but with leukocytes.  Sending for culture.  Discussed referral for OB/GYN due to inability to complete PAP testing in our office due to patient discomfort in the past and with new onset of vaginal bleeding.  Will order ultrasound so patient will have completed prior to meeting with OB/GYN.  Plan: 1. Transvaginal pelvic ultrasound ordered 2. Referral to OB/GYN placed for establishment, PAP testing and evaluation of post-menopausal bleeding.

## 2019-09-07 NOTE — Assessment & Plan Note (Signed)
Right rotator cuff syndrome, likely right rotator cuff tear.  Reports approx 6 months ago was lifting heavy rocks in the yard and has been having pain, loss of ROM, inability to sleep on her right side since.  Decreased ROM on exam today with 4/5 strength in right arm with testing.  Discussed with patient likely will need MRI and Orthopedics will be able to order as well as manage treatment plan.  1. Referral to Orthopedics

## 2019-09-08 DIAGNOSIS — M25511 Pain in right shoulder: Secondary | ICD-10-CM | POA: Diagnosis not present

## 2019-09-08 LAB — URINE CULTURE
MICRO NUMBER:: 10447626
SPECIMEN QUALITY:: ADEQUATE

## 2019-09-08 LAB — COMPLETE METABOLIC PANEL WITH GFR
AG Ratio: 2.3 (calc) (ref 1.0–2.5)
ALT: 17 U/L (ref 6–29)
AST: 18 U/L (ref 10–35)
Albumin: 4.1 g/dL (ref 3.6–5.1)
Alkaline phosphatase (APISO): 75 U/L (ref 37–153)
BUN: 17 mg/dL (ref 7–25)
CO2: 30 mmol/L (ref 20–32)
Calcium: 9.3 mg/dL (ref 8.6–10.4)
Chloride: 106 mmol/L (ref 98–110)
Creat: 0.74 mg/dL (ref 0.50–1.05)
GFR, Est African American: 106 mL/min/{1.73_m2} (ref >=60)
GFR, Est Non African American: 91 mL/min/{1.73_m2} (ref >=60)
Globulin: 1.8 g/dL (calc) — ABNORMAL LOW (ref 1.9–3.7)
Glucose, Bld: 87 mg/dL (ref 65–99)
Potassium: 4.8 mmol/L (ref 3.5–5.3)
Sodium: 140 mmol/L (ref 135–146)
Total Bilirubin: 0.6 mg/dL (ref 0.2–1.2)
Total Protein: 5.9 g/dL — ABNORMAL LOW (ref 6.1–8.1)

## 2019-09-08 LAB — CBC WITH DIFFERENTIAL/PLATELET
Absolute Monocytes: 396 cells/uL (ref 200–950)
Basophils Absolute: 30 cells/uL (ref 0–200)
Basophils Relative: 0.9 %
Eosinophils Absolute: 59 cells/uL (ref 15–500)
Eosinophils Relative: 1.8 %
HCT: 38.6 % (ref 35.0–45.0)
Hemoglobin: 12.9 g/dL (ref 11.7–15.5)
Lymphs Abs: 901 cells/uL (ref 850–3900)
MCH: 33.2 pg — ABNORMAL HIGH (ref 27.0–33.0)
MCHC: 33.4 g/dL (ref 32.0–36.0)
MCV: 99.2 fL (ref 80.0–100.0)
MPV: 9.5 fL (ref 7.5–12.5)
Monocytes Relative: 12 %
Neutro Abs: 1914 cells/uL (ref 1500–7800)
Neutrophils Relative %: 58 %
Platelets: 175 10*3/uL (ref 140–400)
RBC: 3.89 10*6/uL (ref 3.80–5.10)
RDW: 12.6 % (ref 11.0–15.0)
Total Lymphocyte: 27.3 %
WBC: 3.3 10*3/uL — ABNORMAL LOW (ref 3.8–10.8)

## 2019-09-08 LAB — THYROID PANEL WITH TSH
Free Thyroxine Index: 2.6 (ref 1.4–3.8)
T3 Uptake: 33 % (ref 22–35)
T4, Total: 7.8 ug/dL (ref 5.1–11.9)
TSH: 3.56 mIU/L

## 2019-09-08 LAB — LIPID PANEL
Cholesterol: 183 mg/dL (ref <200)
HDL: 61 mg/dL (ref >=50)
LDL Cholesterol (Calc): 107 mg/dL (calc) — ABNORMAL HIGH
Non-HDL Cholesterol (Calc): 122 mg/dL (calc) (ref <130)
Total CHOL/HDL Ratio: 3 (calc) (ref <5.0)
Triglycerides: 66 mg/dL (ref <150)

## 2019-09-09 ENCOUNTER — Encounter: Payer: Self-pay | Admitting: Family Medicine

## 2019-09-11 ENCOUNTER — Other Ambulatory Visit: Payer: Self-pay | Admitting: Family Medicine

## 2019-09-11 DIAGNOSIS — E559 Vitamin D deficiency, unspecified: Secondary | ICD-10-CM

## 2019-09-11 NOTE — Progress Notes (Signed)
Lab order for vitamin D level placed

## 2019-09-12 ENCOUNTER — Other Ambulatory Visit: Payer: Self-pay

## 2019-09-12 ENCOUNTER — Ambulatory Visit
Admission: RE | Admit: 2019-09-12 | Discharge: 2019-09-12 | Disposition: A | Discharge status: Home / Self Care | Payer: Medicare Other | Source: Ambulatory Visit | Attending: Family Medicine | Admitting: Family Medicine

## 2019-09-12 DIAGNOSIS — N950 Postmenopausal bleeding: Secondary | ICD-10-CM | POA: Diagnosis not present

## 2019-09-14 ENCOUNTER — Ambulatory Visit (INDEPENDENT_AMBULATORY_CARE_PROVIDER_SITE_OTHER): Payer: Medicare Other | Admitting: Obstetrics and Gynecology

## 2019-09-14 ENCOUNTER — Encounter: Payer: Self-pay | Admitting: Obstetrics and Gynecology

## 2019-09-14 ENCOUNTER — Other Ambulatory Visit (HOSPITAL_COMMUNITY)
Admission: RE | Admit: 2019-09-14 | Discharge: 2019-09-14 | Disposition: A | Discharge status: Home / Self Care | Payer: Medicare Other | Source: Ambulatory Visit | Attending: Obstetrics and Gynecology | Admitting: Obstetrics and Gynecology

## 2019-09-14 ENCOUNTER — Other Ambulatory Visit: Payer: Self-pay

## 2019-09-14 VITALS — BP 122/68 | Ht 64.0 in | Wt 168.0 lb

## 2019-09-14 DIAGNOSIS — Z124 Encounter for screening for malignant neoplasm of cervix: Secondary | ICD-10-CM | POA: Diagnosis not present

## 2019-09-14 DIAGNOSIS — N950 Postmenopausal bleeding: Secondary | ICD-10-CM | POA: Insufficient documentation

## 2019-09-14 DIAGNOSIS — Z1151 Encounter for screening for human papillomavirus (HPV): Secondary | ICD-10-CM | POA: Insufficient documentation

## 2019-09-14 DIAGNOSIS — Z923 Personal history of irradiation: Secondary | ICD-10-CM | POA: Diagnosis not present

## 2019-09-14 DIAGNOSIS — Z780 Asymptomatic menopausal state: Secondary | ICD-10-CM | POA: Diagnosis not present

## 2019-09-14 DIAGNOSIS — Z85048 Personal history of other malignant neoplasm of rectum, rectosigmoid junction, and anus: Secondary | ICD-10-CM | POA: Insufficient documentation

## 2019-09-14 NOTE — Progress Notes (Signed)
Obstetrics & Gynecology Office Visit   Chief Complaint  Patient presents with   Vaginal Bleeding    Referral by [place] postmenopausal bleeding   The patient is seen in referral at the request of Malf[place], Lupita Raider, FNP, from [facility] for postmenopausal bleeding.   History of Present Illness: 54 y.o. female who is seen in referral from [place], Lupita Raider, [place], from [facility] for postmenopausal bleeding.    She noted some vaginal bleeding about 1-1.5 weeks ago.  She is sure it was vaginal.  She notes no vaginal bleeding since treatment for anal cancer.  She compares the bleeding to period bleeding.  The bleeding lasted about 1 hour.  The onset was sudden and the bleeding was a lot, no clots, bright red, and then it stopped.  She might have had some trickle of blood for the rest of the day.  Then, the bleeding just stopped.  She can think of nothing that might've started the bleeding.  She took ibuprofen the day before due to arm pain.  She had no trauma and can think of no inciting event.  She notes nausea lately.  However, she does not believe this is anything out of the ordinary for her, given her past issues with cancer.  She denies unintentional weight loss. She has been trying to lose weight and has lost about 30 pounds in six months.  She denies early satiety. She denies constipation.  More commonly she notes diarrhea.  She denies new bloating.    The only abnormal symptom she had was swelling in her feet.  This has resolved mostly now.  She has not had intercourse in 6-8 months.  Even then it wasn't full penetration due to discomfort.   She has a history of anal cancer about 6-7 years ago. She has had no menses since that time.   Pelvic ultrasound that showed endometrial stripe of 3 mm on transabdominal view.  This was not well visualized on transvaginal view.  I have reviewed the images and agree with the report.    Past Medical History:    Diagnosis Date   Arthritis    CAD (coronary artery disease)    s/p stenting x 2   Cancer ([place])    anus cancer   Colon polyp    Depression    Hyperlipidemia    MI (myocardial infarction) ([place])    Thyroid disease    Vitamin D deficiency     Past Surgical History:  Procedure Laterality Date   CARDIAC SURGERY     CORONARY ANGIOPLASTY WITH STENT PLACEMENT     TUBAL LIGATION      Gynecologic History: No LMP recorded. Patient is postmenopausal.   Family History  Problem Relation Age of Onset   Alzheimer's disease Mother    CAD Father    Stroke Brother    Alzheimer's disease Maternal Grandmother    Thyroid disease Brother    Hyperlipidemia Brother    Hyperlipidemia Brother    Alcohol abuse Brother    Healthy Daughter    Healthy Son    Heart disease Paternal Uncle    Heart disease Paternal Grandmother    Heart disease Paternal Grandfather    Thyroid disease Brother    Cirrhosis Brother    Healthy Son    Breast cancer Cousin     Social History   Socioeconomic History   Marital status: Married    Spouse name: Not on file  Number of children: Not on file   Years of education: Not on file   Highest education level: Not on file  Occupational History   Not on file  Tobacco Use   Smoking status: Former Smoker    Packs/day: 0.75    Years: 30.00    Pack years: 22.50    Quit date: 01/21/2007    Years since quitting: 12.6   Smokeless tobacco: Never Used  Substance and Sexual Activity   Alcohol use: No   Drug use: No   Sexual activity: Not Currently    Birth control/protection: None  Other Topics Concern   Not on file  Social History Narrative   Not on file   Social Determinants of Health   Financial Resource Strain:    Difficulty of Paying Living Expenses:   Food Insecurity:    Worried About Charity fundraiser in the Last Year:    Arboriculturist in the Last Year:   Transportation Needs:    Lexicographer (Medical):    Lack of Transportation (Non-Medical):   Physical Activity:    Days of Exercise per Week:    Minutes of Exercise per Session:   Stress:    Feeling of Stress :   Social Connections:    Frequency of Communication with Friends and Family:    Frequency of Social Gatherings with Friends and Family:    Attends Religious Services:    Active Member of Clubs or Organizations:    Attends Archivist Meetings:    Marital Status:   Intimate Partner Violence:    Fear of Current or Ex-Partner:    Emotionally Abused:    Physically Abused:    Sexually Abused:     Allergies  Allergen Reactions   Sulfa Antibiotics Rash   Sulfasalazine Rash   Tape Rash    Prior to Admission medications   Medication Sig Start Date End Date Taking? Authorizing Provider  buPROPion (WELLBUTRIN XL) 150 MG 24 hr tablet Take 1 tablet by mouth once daily 09/03/19   Malfi, Lupita Raider, FNP  cholecalciferol (VITAMIN D) 1000 units tablet Take 1,000 Units by mouth daily.    [provider]  cyclobenzaprine (FLEXERIL) 10 MG tablet Take 1 tablet (10 mg total) by mouth at bedtime as needed for muscle spasms. 04/17/19   Karamalegos, Devonne Doughty, DO  Flaxseed, Linseed, (FLAX SEED OIL) 1000 MG CAPS Take by mouth.    [provider]  fluticasone (FLONASE) 50 MCG/ACT nasal spray 1 spray by Each Nare route daily. 09/25/16   [provider]  levothyroxine (SYNTHROID) 88 MCG tablet TAKE 1 TABLET BY MOUTH ONCE DAILY BEFORE BREAKFAST 08/18/19   Malfi, Lupita Raider, FNP  Multiple Vitamin (MULTIVITAMIN) tablet Take 1 tablet by mouth daily.    [provider]  naproxen (NAPROSYN) 500 MG tablet Take by mouth. 01/21/17   [provider]  nitroGLYCERIN (NITROSTAT) 0.4 MG SL tablet Place 1 tablet (0.4 mg total) under the tongue every 5 (five) minutes as needed for chest pain. Patient not taking: Reported on 01/31/2018 03/23/17 03/23/18  Earleen Newport,  MD  traMADol (ULTRAM) 50 MG tablet Take 1 tablet (50 mg total) by mouth 2 (two) times daily as needed for moderate pain (abdominal or rectal pain). 06/08/19   Olin Hauser, DO    Review of Systems  Constitutional: Negative.   HENT: Negative.   Eyes: Negative.   Respiratory: Negative.   Cardiovascular: Negative.   Gastrointestinal: Negative.  Genitourinary: Negative.   Musculoskeletal: Negative.   Skin: Negative.   Neurological: Negative.   Psychiatric/Behavioral: Negative.      Physical Exam BP 122/68    Ht 5\' 4"  (1.626 m)    Wt 168 lb (76.2 kg)    BMI 28.84 kg/m  No LMP recorded. Patient is postmenopausal. Physical Exam Constitutional:      General: She is not in acute distress.    Appearance: Normal appearance. She is well-developed.  Genitourinary:     Pelvic exam was performed with patient in the lithotomy position.     Vulva, inguinal canal, urethra and bladder normal.     No posterior fourchette tenderness, injury or lesion present.     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  The depth is about 1-1.5 inches.  There appears to be no tract to the cervix.  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  A pap smear of the vaginal tissue was taken to verify no VAIN present, though none apparent on exam.   HENT:     Head: Normocephalic and atraumatic.  Eyes:     General: No scleral icterus.    Conjunctiva/sclera: Conjunctivae normal.  Cardiovascular:     Rate and Rhythm: Normal rate and regular rhythm.     Heart sounds: No murmur. No friction rub. No gallop.   Pulmonary:     Effort: Pulmonary effort is normal. No respiratory distress.     Breath sounds: Normal breath sounds. No wheezing or rales.  Abdominal:     General: Bowel sounds are normal. There is no distension.     Palpations: Abdomen is soft. There is no mass.     Tenderness: There is no abdominal tenderness.  There is no guarding or rebound.  Musculoskeletal:        General: Normal range of motion.     Cervical back: Normal range of motion and neck supple.  Neurological:     General: No focal deficit present.     Mental Status: She is alert and oriented to person, place, and time.     Cranial Nerves: No cranial nerve deficit.  Skin:    General: Skin is warm and dry.     Findings: No erythema.  Psychiatric:        Mood and Affect: Mood normal.        Behavior: Behavior normal.        Judgment: Judgment normal.    US Pelvic Complete With Transvaginal  Result Date: 09/12/2019 CLINICAL DATA:  Postmenopausal bleeding for 1 day EXAM: TRANSABDOMINAL AND TRANSVAGINAL ULTRASOUND OF PELVIS TECHNIQUE: Both transabdominal and transvaginal ultrasound examinations of the pelvis were performed. Transabdominal technique was performed for global imaging of the pelvis including uterus, ovaries, adnexal regions, and pelvic cul-de-sac. It was necessary to proceed with endovaginal exam following the transabdominal exam to visualize the uterus, endometrium, and ovaries. COMPARISON:  None FINDINGS: Uterus Measurements: 5.5 x 2.5 x 3.5 cm = volume: 25 mL. Anteverted. Atrophic. No gross mass. Endometrium Thickness: Suboptimally visualized on transvaginal imaging, 3 mm thick on transabdominal imaging. No endometrial fluid or focal abnormality. Right ovary Not visualized, likely obscured by bowel Left ovary Not visualized, likely obscured by bowel Other findings No free pelvic fluid.  Nabothian cyst at cervix.  No adnexal masses. IMPRESSION: 3 mm thick endometrial complex, inadequately visualized on transvaginal imaging. In the setting of post-menopausal bleeding, this is consistent with a benign  etiology such as endometrial atrophy. If bleeding remains unresponsive to hormonal or medical therapy, sonohysterogram should be considered for focal lesion work-up. (Ref: Radiological Reasoning: Algorithmic Workup of Abnormal Vaginal  Bleeding with Endovaginal Sonography and Sonohysterography. [id number] [id number]). Nonvisualization of ovaries. Electronically Signed   By: Lavonia Dana M.D.   On: 09/12/2019 19:25     Female chaperone present for pelvic and breast  portions of the physical exam  Assessment: 54 y.o. No obstetric history on file. female here for  1. Postmenopausal vaginal bleeding      Plan: Problem List Items Addressed This Visit      Other   Postmenopausal vaginal bleeding - Primary   Relevant Orders   Cytology - PAP     Discussed findings on ultrasound and on physical exam today.  There is no obvious etiology for her bleeding.  Based on exam findings, it would be difficult to imagine a source of bleeding that is her uterus or cervix unless there is a very small tract which I could not visualize on exam today.  Since she has had no bleeding since her initial episode and she had an essentially normal pelvic ultrasound, I recommended that we wait to see if she has bleeding again.  Further, I recommended that if she has active bleeding, that she try to schedule an appointment with me for the same day so that I could see where the bleeding originates.  She voiced understanding and agreement to this plan.  Prentice Docker, MD 09/14/2019 5:21 PM    CC: Verl Bangs, [address],  [place] [id number]

## 2019-09-18 DIAGNOSIS — E038 Other specified hypothyroidism: Secondary | ICD-10-CM | POA: Diagnosis not present

## 2019-09-18 DIAGNOSIS — Z03818 Encounter for observation for suspected exposure to other biological agents ruled out: Secondary | ICD-10-CM | POA: Diagnosis not present

## 2019-09-18 DIAGNOSIS — Z20828 Contact with and (suspected) exposure to other viral communicable diseases: Secondary | ICD-10-CM | POA: Diagnosis not present

## 2019-09-18 DIAGNOSIS — J00 Acute nasopharyngitis [common cold]: Secondary | ICD-10-CM | POA: Diagnosis not present

## 2019-09-18 LAB — CYTOLOGY - PAP
Comment: NEGATIVE
Diagnosis: NEGATIVE
High risk HPV: NEGATIVE

## 2019-10-05 ENCOUNTER — Other Ambulatory Visit: Payer: Self-pay | Admitting: Family Medicine

## 2019-10-05 DIAGNOSIS — G894 Chronic pain syndrome: Secondary | ICD-10-CM

## 2019-10-05 NOTE — Telephone Encounter (Signed)
Requested medication (s) are due for refill today:  Yes  Requested medication (s) are on the active medication list: Yes  Last refill:  06/08/19  Future visit scheduled: No  Notes to clinic:  See request.    Requested Prescriptions  Pending Prescriptions Disp Refills   traMADol (ULTRAM) 50 MG tablet [Pharmacy Med Name: traMADol HCl 50 MG Oral Tablet] 60 tablet 0    Sig: TAKE 1 TABLET BY MOUTH TWICE DAILY AS NEEDED FOR  MODERATE  PAIN.  (ABDOMINAL  OR  RECTAL  PAIN)      Not Delegated - Analgesics:  Opioid Agonists Failed - 10/05/2019  3:24 PM      Failed - This refill cannot be delegated      Failed - Urine Drug Screen completed in last 360 days.      Passed - Valid encounter within last 6 months    Recent Outpatient Visits           4 weeks ago Gross hematuria   [place], FNP   5 months ago Acquired hypothyroidism   [place], DO   8 months ago Need for immunization against influenza   [facility] Mikey College, NP   9 months ago Acquired hypothyroidism   [facility] Merrilyn Puma, Jerrel Ivory, NP   1 year ago Chronic pain due to neoplasm   [facility] Mikey College, NP

## 2019-10-06 ENCOUNTER — Other Ambulatory Visit: Payer: Self-pay | Admitting: Family Medicine

## 2019-10-06 DIAGNOSIS — Z23 Encounter for immunization: Secondary | ICD-10-CM | POA: Diagnosis not present

## 2019-10-06 DIAGNOSIS — G894 Chronic pain syndrome: Secondary | ICD-10-CM

## 2019-10-06 NOTE — Telephone Encounter (Signed)
Requested medication (s) are due for refill today - unknown  Requested medication (s) are on the active medication list -yes  Future visit scheduled -no  Last refill: 08/04/19  Notes to clinic: Request for non delegated Rx  Requested Prescriptions  Pending Prescriptions Disp Refills   traMADol (ULTRAM) 50 MG tablet [Pharmacy Med Name: traMADol HCl 50 MG Oral Tablet] 60 tablet 0    Sig: TAKE 1 TABLET BY MOUTH TWICE DAILY AS NEEDED FOR  MODERATE  PAIN.  (ABDOMINAL  OR  RECTAL  PAIN)      Not Delegated - Analgesics:  Opioid Agonists Failed - 10/06/2019  1:23 PM      Failed - This refill cannot be delegated      Failed - Urine Drug Screen completed in last 360 days.      Passed - Valid encounter within last 6 months    Recent Outpatient Visits           4 weeks ago Gross hematuria   [place], FNP   5 months ago Acquired hypothyroidism   [place], DO   8 months ago Need for immunization against influenza   [facility] Merrilyn Puma, Jerrel Ivory, NP   9 months ago Acquired hypothyroidism   [facility] Merrilyn Puma, Jerrel Ivory, NP   1 year ago Chronic pain due to neoplasm   [place], NP                  Requested Prescriptions  Pending Prescriptions Disp Refills   traMADol (ULTRAM) 50 MG tablet [Pharmacy Med Name: traMADol HCl 50 MG Oral Tablet] 60 tablet 0    Sig: TAKE 1 TABLET BY MOUTH TWICE DAILY AS NEEDED FOR  MODERATE  PAIN.  (ABDOMINAL  OR  RECTAL  PAIN)      Not Delegated - Analgesics:  Opioid Agonists Failed - 10/06/2019  1:23 PM      Failed - This refill cannot be delegated      Failed - Urine Drug Screen completed in last 360 days.      Passed - Valid encounter within last 6 months    Recent Outpatient Visits           4 weeks ago Gross hematuria   [place], FNP   5 months ago  Acquired hypothyroidism   [place], DO   8 months ago Need for immunization against influenza   [facility] Mikey College, NP   9 months ago Acquired hypothyroidism   [facility] Merrilyn Puma, Jerrel Ivory, NP   1 year ago Chronic pain due to neoplasm   [facility] Mikey College, NP

## 2019-10-06 NOTE — Telephone Encounter (Signed)
Personally reviewed [place] today, 10/06/19.  According to PMP aware, pt last received a refill on 08/04/2019.  Refill of her controlled substance will be provided today.

## 2019-11-09 ENCOUNTER — Other Ambulatory Visit: Payer: Self-pay

## 2019-11-09 ENCOUNTER — Encounter: Payer: Self-pay | Admitting: Family Medicine

## 2019-11-09 ENCOUNTER — Ambulatory Visit (INDEPENDENT_AMBULATORY_CARE_PROVIDER_SITE_OTHER): Payer: Medicare Other | Admitting: Family Medicine

## 2019-11-09 ENCOUNTER — Telehealth: Payer: Self-pay | Admitting: Family Medicine

## 2019-11-09 DIAGNOSIS — G894 Chronic pain syndrome: Secondary | ICD-10-CM | POA: Diagnosis not present

## 2019-11-09 DIAGNOSIS — Z23 Encounter for immunization: Secondary | ICD-10-CM | POA: Diagnosis not present

## 2019-11-09 DIAGNOSIS — R197 Diarrhea, unspecified: Secondary | ICD-10-CM | POA: Insufficient documentation

## 2019-11-09 MED ORDER — TRAMADOL HCL 50 MG PO TABS: ORAL_TABLET | ORAL | 0 refills | Status: DC

## 2019-11-09 NOTE — Assessment & Plan Note (Signed)
Reports diarrhea with mucus for > 6 months.  States follows with [facility] oncology and gastroenterology but has not made them aware of these symptoms.  Educated to avoid artificial sweeteners and diet beverages.  Plan: 1. Contact [facility] to make follow up visit with Oncology/GI 2. Continue to avoid diet beverages and artificial sweeteners 3. RTC in November for CPE

## 2019-11-09 NOTE — Telephone Encounter (Signed)
During clinic visit for diarrhea, patient had requested I reach out to your office.  Said she was awaiting an update on how she can be screened for cervical cancer since her cervix is unable to be swabbed.  Thanks in advance

## 2019-11-09 NOTE — Progress Notes (Signed)
Subjective:    Patient ID: Brenda Grimes, female    DOB: 08/25/1965, 54 y.o.   MRN: [id number]  Brenda Grimes is a 54 y.o. female presenting on 11/09/2019 for Diarrhea (frequent diarrhea that she associate with monster energy drinks  x 6 mths. The last episode was 4 days ago. Symptoms stopped after stopping the energy drinks. )   HPI  Brenda Grimes presents to clinic for concerns of diarrhea with mucus.  Reports this has been going on >6 months, follows with [facility] for her anal cancer and gastroenterology visits, but has not let them know about this mucus concern.  Has cut out diet sodas/beverages and artificial sweeteners.  Has stopped these beverages approx 4 days ago with improvement in symptoms.   Depression screen [facility] 2/9 11/09/2019 09/07/2019 07/11/2019  Decreased Interest 0 0 0  Down, Depressed, Hopeless 0 0 0  PHQ - 2 Score 0 0 0  Altered sleeping - 0 -  Tired, decreased energy - 0 -  Change in appetite - 0 -  Feeling bad or failure about yourself  - 0 -  Trouble concentrating - 1 -  Moving slowly or fidgety/restless - 0 -  Suicidal thoughts - 0 -  PHQ-9 Score - 1 -  Difficult doing work/chores - Not difficult at all -    Social History   Tobacco Use  . Smoking status: Former Smoker    Packs/day: 0.75    Years: 30.00    Pack years: 22.50    Quit date: 01/21/2007    Years since quitting: 12.8  . Smokeless tobacco: Never Used  Vaping Use  . Vaping Use: Never used  Substance Use Topics  . Alcohol use: No  . Drug use: No    Review of Systems  Constitutional: Negative.   HENT: Negative.   Eyes: Negative.   Respiratory: Negative.   Cardiovascular: Negative.   Gastrointestinal: Positive for diarrhea. Negative for abdominal distention, abdominal pain, anal bleeding, blood in stool, constipation, nausea, rectal pain and vomiting.  Endocrine: Negative.   Genitourinary: Negative.   Musculoskeletal: Negative.   Skin: Negative.   Allergic/Immunologic: Negative.   Neurological:  Negative.   Hematological: Negative.   Psychiatric/Behavioral: Negative.    Per HPI unless specifically indicated above     Objective:    BP 100/61 (BP Location: Left Arm, Patient Position: Sitting, Cuff Size: Normal)   Pulse 77   Temp (!) 97.1 F (36.2 C) (Temporal)   Resp 18   Ht 5\' 4"  (1.626 m)   Wt 168 lb 6.4 oz (76.4 kg)   SpO2 99%   BMI 28.91 kg/m   Wt Readings from Last 3 Encounters:  11/09/19 168 lb 6.4 oz (76.4 kg)  09/14/19 168 lb (76.2 kg)  09/07/19 170 lb 3.2 oz (77.2 kg)    Physical Exam Vitals reviewed.  Constitutional:      General: She is not in acute distress.    Appearance: Normal appearance. She is well-developed, well-groomed and overweight. She is not ill-appearing or toxic-appearing.  HENT:     Head: Normocephalic and atraumatic.     Nose:     Comments: Brenda Grimes is in place, covering mouth and nose. Eyes:     General: Lids are normal. Vision grossly intact.        Right eye: No discharge.        Left eye: No discharge.     Extraocular Movements: Extraocular movements intact.     Conjunctiva/sclera: Conjunctivae normal.  Pupils: Pupils are equal, round, and reactive to light.  Cardiovascular:     Rate and Rhythm: Normal rate and regular rhythm.     Pulses: Normal pulses.          Dorsalis pedis pulses are 2+ on the right side and 2+ on the left side.     Heart sounds: Normal heart sounds. No murmur heard.  No friction rub. No gallop.   Pulmonary:     Effort: Pulmonary effort is normal. No respiratory distress.     Breath sounds: Normal breath sounds.  Abdominal:     General: Abdomen is flat. There is no distension.     Palpations: Abdomen is soft.     Tenderness: There is no abdominal tenderness. There is no guarding.  Musculoskeletal:     Right lower leg: No edema.     Left lower leg: No edema.  Skin:    General: Skin is warm and dry.     Capillary Refill: Capillary refill takes less than 2 seconds.  Neurological:     General: No  focal deficit present.     Mental Status: She is alert and oriented to person, place, and time.  Psychiatric:        Attention and Perception: Attention and perception normal.        Mood and Affect: Mood and affect normal.        Speech: Speech normal.        Behavior: Behavior normal. Behavior is cooperative.        Thought Content: Thought content normal.        Cognition and Memory: Cognition and memory normal.        Judgment: Judgment normal.     Results for orders placed or performed in visit on 09/14/19  Cytology - PAP  Result Value Ref Range   High risk HPV Negative    Adequacy      Satisfactory for evaluation; transformation zone component PRESENT.   Diagnosis      - Negative for intraepithelial lesion or malignancy (NILM)   Comment Normal Reference Range HPV - Negative       Assessment & Plan:   Problem List Items Addressed This Visit      Other   Chronic pain syndrome   Relevant Medications   traMADol (ULTRAM) 50 MG tablet   Diarrhea    Reports diarrhea with mucus for > 6 months.  States follows with [facility] oncology and gastroenterology but has not made them aware of these symptoms.  Educated to avoid artificial sweeteners and diet beverages.  Plan: 1. Contact [facility] to make follow up visit with Oncology/GI 2. Continue to avoid diet beverages and artificial sweeteners 3. RTC in November for CPE         Meds ordered this encounter  Medications  . traMADol (ULTRAM) 50 MG tablet    Sig: TAKE 1 TABLET BY MOUTH TWICE DAILY AS NEEDED FOR  MODERATE  PAIN.  (ABDOMINAL  OR  RECTAL  PAIN)    Dispense:  60 tablet    Refill:  0      Follow up plan: Return in about 4 months (around 03/04/2020) for CPE.   [name], [place] [facility] Nurse Practitioner [facility] 11/09/2019, 11:51 AM

## 2019-11-09 NOTE — Patient Instructions (Signed)
Your medication refills have been sent to your pharmacy on file.  Schedule a follow up visit with [facility] for your concerns of mucus in your stool.  It is important with your history of anal cancer that they are aware of any changes in your bowel movements.  Continue to avoid all diet sodas/beverages as they can be hard on your stomach.  We will plan to see you back in November 2021 for your physical  You will receive a survey after today's visit either digitally by e-mail or paper by [employer]. Your experiences and feedback matter to [place].  Please respond so we know how we are doing as we provide care for you.  Call us with any questions/concerns/needs.  It is my goal to be available to you for your health concerns.  Thanks for choosing me to be a partner in your healthcare needs!  [name], FNP-C Family Nurse Practitioner [facility] Phone: [phone]

## 2019-11-10 ENCOUNTER — Other Ambulatory Visit: Payer: Self-pay

## 2019-11-10 ENCOUNTER — Encounter: Payer: Self-pay | Admitting: Family Medicine

## 2019-11-10 ENCOUNTER — Telehealth (INDEPENDENT_AMBULATORY_CARE_PROVIDER_SITE_OTHER): Payer: Medicare Other | Admitting: Family Medicine

## 2019-11-10 ENCOUNTER — Ambulatory Visit: Payer: Self-pay | Admitting: *Deleted

## 2019-11-10 DIAGNOSIS — J029 Acute pharyngitis, unspecified: Secondary | ICD-10-CM

## 2019-11-10 DIAGNOSIS — R52 Pain, unspecified: Secondary | ICD-10-CM | POA: Diagnosis not present

## 2019-11-10 DIAGNOSIS — R519 Headache, unspecified: Secondary | ICD-10-CM | POA: Diagnosis not present

## 2019-11-10 DIAGNOSIS — R5383 Other fatigue: Secondary | ICD-10-CM

## 2019-11-10 NOTE — Assessment & Plan Note (Signed)
See headache A/P

## 2019-11-10 NOTE — Patient Instructions (Signed)
Can take ibuprofen 800mg  every 8 hours with benadryl 50mg  every 8 hours as needed for symptoms.  Increase water intake and rest.  Your symptoms could last up to 72 hours from your second dose of your COVID vaccine.  We will plan to see you back if your symptoms worsen after 72 hours or fail to improve.  You will receive a survey after today's visit either digitally by e-mail or paper by [employer]. Your experiences and feedback matter to [place].  Please respond so we know how we are doing as we provide care for you.  Call us with any questions/concerns/needs.  It is my goal to be available to you for your health concerns.  Thanks for choosing me to be a partner in your healthcare needs!  [name], FNP-C Family Nurse Practitioner [facility] Phone: [phone]

## 2019-11-10 NOTE — Assessment & Plan Note (Signed)
S/P COVID vaccine x 1 day ago.  Reports symptom onset approx 10 hours ago with headache, body aches, sore throat, chills and fatigue.  Second dose of Moderna vaccine.  Has tried taking acetaminophen without relief of symptoms.  Discussed can take ibuprofen 800mg  along with benadryl 50mg  every 8 hours as needed, that this may make her drowsy so to avoid driving or operating heavy machinery.  Can gargle with warm salt water and/or apple cider vinegar.  Educated symptoms may last up to 72 hours from immunization.  Plan: 1. Symptom management with ibuprofen and benadryl, as discussed 2. Increase fluids and rest

## 2019-11-10 NOTE — Progress Notes (Signed)
Virtual Visit via Telephone  The purpose of this virtual visit is to provide medical care while limiting exposure to the novel coronavirus (COVID19) for both patient and office staff.  Consent was obtained for phone visit:  Yes.   Answered questions that patient had about telehealth interaction:  Yes.   I discussed the limitations, risks, security and privacy concerns of performing an evaluation and management service by telephone. I also discussed with the patient that there may be a patient responsible charge related to this service. The patient expressed understanding and agreed to proceed.  Patient is at home and is accessed via telephone Services are provided by [name], FNP-C from [facility])  ---------------------------------------------------------------------- Chief Complaint  Patient presents with  . Covid Vaccine     pt got her Covid vaccine yesterday, she complains of severe headache, bodyaches, swollen sore throat, chills and fatigue. Pt seems to think shes running a fever, but doesn't have a thermometer. x 10 hrs     S: Reviewed CMA documentation. I have called patient and gathered additional HPI as follows:  [name] presents for telemedicine MyChart video visit.  Reports she has had symptom onset approximately 10 hours ago, after receiving her second COVID vaccine yesterday (Moderna).  Has headaches, body aches, sore throat, chills and fatigue.  Has tried to take acetaminophen without relief of symptoms.  Believes may have a fever but does not have a thermometer to check this.  Patient is currently home Denies any high risk travel to areas of current concern for COVID19. Denies any known or suspected exposure to person with or possibly with COVID19.  Denies any sweats, cough, shortness of breath, sinus pain or pressure, abdominal pain, diarrhea  Past Medical History:  Diagnosis Date  . Arthritis   . CAD (coronary artery disease)     s/p stenting x 2  . Cancer ([place])    anus cancer  . Colon polyp   . Depression   . Hyperlipidemia   . MI (myocardial infarction) ([place])   . Thyroid disease   . Vitamin D deficiency    Social History   Tobacco Use  . Smoking status: Former Smoker    Packs/day: 0.75    Years: 30.00    Pack years: 22.50    Quit date: 01/21/2007    Years since quitting: 12.8  . Smokeless tobacco: Never Used  Vaping Use  . Vaping Use: Never used  Substance Use Topics  . Alcohol use: No  . Drug use: No    Current Outpatient Medications:  .  acetaminophen (TYLENOL) 500 MG tablet, Take 1,000 mg by mouth every 6 (six) hours as needed., Disp: , Rfl:  .  buPROPion (WELLBUTRIN XL) 150 MG 24 hr tablet, Take 1 tablet by mouth once daily, Disp: 90 tablet, Rfl: 0 .  cholecalciferol (VITAMIN D) 1000 units tablet, Take 1,000 Units by mouth daily. , Disp: , Rfl:  .  cyclobenzaprine (FLEXERIL) 10 MG tablet, Take 1 tablet (10 mg total) by mouth at bedtime as needed for muscle spasms., Disp: 30 tablet, Rfl: 1 .  Flaxseed, Linseed, (FLAX SEED OIL) 1000 MG CAPS, Take by mouth. , Disp: , Rfl:  .  fluticasone (FLONASE) 50 MCG/ACT nasal spray, 1 spray by Each Nare route daily., Disp: , Rfl:  .  levothyroxine (SYNTHROID) 88 MCG tablet, TAKE 1 TABLET BY MOUTH ONCE DAILY BEFORE BREAKFAST, Disp: 90 tablet, Rfl: 0 .  loperamide (IMODIUM) 2 MG capsule, Take 2 mg by  mouth as needed for diarrhea or loose stools., Disp: , Rfl:  .  Multiple Vitamin (MULTIVITAMIN) tablet, Take 1 tablet by mouth daily. , Disp: , Rfl:  .  naproxen (NAPROSYN) 500 MG tablet, Take by mouth., Disp: , Rfl:  .  traMADol (ULTRAM) 50 MG tablet, TAKE 1 TABLET BY MOUTH TWICE DAILY AS NEEDED FOR  MODERATE  PAIN.  (ABDOMINAL  OR  RECTAL  PAIN), Disp: 60 tablet, Rfl: 0 .  nitroGLYCERIN (NITROSTAT) 0.4 MG SL tablet, Place 1 tablet (0.4 mg total) under the tongue every 5 (five) minutes as needed for chest pain. (Patient not taking: Reported on 01/31/2018), Disp: 100  tablet, Rfl: 3  Depression screen [facility] 2/9 11/09/2019 09/07/2019 07/11/2019  Decreased Interest 0 0 0  Down, Depressed, Hopeless 0 0 0  PHQ - 2 Score 0 0 0  Altered sleeping - 0 -  Tired, decreased energy - 0 -  Change in appetite - 0 -  Feeling bad or failure about yourself  - 0 -  Trouble concentrating - 1 -  Moving slowly or fidgety/restless - 0 -  Suicidal thoughts - 0 -  PHQ-9 Score - 1 -  Difficult doing work/chores - Not difficult at all -    GAD 7 : Generalized Anxiety Score 06/02/2018 05/05/2018  Nervous, Anxious, on Edge 1 0  Control/stop worrying 3 0  Worry too much - different things 3 0  Trouble relaxing 3 0  Restless 1 0  Easily annoyed or irritable 1 0  Afraid - awful might happen 1 1  Total GAD 7 Score 13 1  Anxiety Difficulty Somewhat difficult Not difficult at all    -------------------------------------------------------------------------- O: No physical exam performed due to remote telephone encounter.  Physical Exam: Patient remotely monitored with video.  Verbal communication appropriate.  Cognition normal.  Recent Results (from the past 2160 hour(s))  POCT Urinalysis Dipstick     Status: Abnormal   Collection Time: 09/07/19  9:32 AM  Result Value Ref Range   Color, UA yellow    Clarity, UA clear    Glucose, UA Negative Negative   Bilirubin, UA negative    Ketones, UA negative    Spec Grav, UA 1.015 1.010 - 1.025   Blood, UA negative    pH, UA 7.0 5.0 - 8.0   Protein, UA Negative Negative   Urobilinogen, UA 0.2 0.2 or 1.0 E.U./dL   Nitrite, UA negative    Leukocytes, UA Trace (A) Negative   Appearance     Odor    CBC with Differential     Status: Abnormal   Collection Time: 09/07/19 10:01 AM  Result Value Ref Range   WBC 3.3 (L) 3.8 - 10.8 Thousand/uL   RBC 3.89 3.80 - 5.10 Million/uL   Hemoglobin 12.9 11.7 - 15.5 g/dL   HCT 38.6 35 - 45 %   MCV 99.2 80.0 - 100.0 fL   MCH 33.2 (H) 27.0 - 33.0 pg   MCHC 33.4 32.0 - 36.0 g/dL   RDW 12.6 11.0 -  15.0 %   Platelets 175 140 - 400 Thousand/uL   MPV 9.5 7.5 - 12.5 fL   Neutro Abs 1,914 1,500 - 7,800 cells/uL   Lymphs Abs 901 850 - 3,900 cells/uL   Absolute Monocytes 396 200 - 950 cells/uL   Eosinophils Absolute 59 15 - 500 cells/uL   Basophils Absolute 30 0 - 200 cells/uL   Neutrophils Relative % 58 %   Total Lymphocyte 27.3 %   Monocytes  Relative 12.0 %   Eosinophils Relative 1.8 %   Basophils Relative 0.9 %  COMPLETE METABOLIC PANEL WITH GFR     Status: Abnormal   Collection Time: 09/07/19 10:01 AM  Result Value Ref Range   Glucose, Bld 87 65 - 99 mg/dL    Comment: .            Fasting reference interval .    BUN 17 7 - 25 mg/dL   Creat 0.74 0.50 - 1.05 mg/dL    Comment: For patients >70 years of age, the reference limit for Creatinine is approximately 13% higher for people identified as African-American. .    GFR, Est Non African American 91 > OR = 60 mL/min/1.70m2   GFR, Est African American 106 > OR = 60 mL/min/1.66m2   BUN/Creatinine Ratio NOT APPLICABLE 6 - 22 (calc)   Sodium 140 135 - 146 mmol/L   Potassium 4.8 3.5 - 5.3 mmol/L   Chloride 106 98 - 110 mmol/L   CO2 30 20 - 32 mmol/L   Calcium 9.3 8.6 - 10.4 mg/dL   Total Protein 5.9 (L) 6.1 - 8.1 g/dL   Albumin 4.1 3.6 - 5.1 g/dL   Globulin 1.8 (L) 1.9 - 3.7 g/dL (calc)   AG Ratio 2.3 1.0 - 2.5 (calc)   Total Bilirubin 0.6 0.2 - 1.2 mg/dL   Alkaline phosphatase (APISO) 75 37 - 153 U/L   AST 18 10 - 35 U/L   ALT 17 6 - 29 U/L  Lipid Profile     Status: Abnormal   Collection Time: 09/07/19 10:01 AM  Result Value Ref Range   Cholesterol 183 <200 mg/dL   HDL 61 > OR = 50 mg/dL   Triglycerides 66 <150 mg/dL   LDL Cholesterol (Calc) 107 (H) mg/dL (calc)    Comment: Reference range: <100 . Desirable range <100 mg/dL for primary prevention;   <70 mg/dL for patients with CHD or diabetic patients  with > or = 2 CHD risk factors. Marland Kitchen LDL-C is now calculated using the Martin-Hopkins  calculation, which is a  validated novel method providing  better accuracy than the Friedewald equation in the  estimation of LDL-C.  Cresenciano Genre et al. Annamaria Helling. 0109;323(55): 2061-2068  (http://education.[employer].com/faq/FAQ164)    Total CHOL/HDL Ratio 3.0 <5.0 (calc)   Non-HDL Cholesterol (Calc) 122 <130 mg/dL (calc)    Comment: For patients with diabetes plus 1 major ASCVD risk  factor, treating to a non-HDL-C goal of <100 mg/dL  (LDL-C of <70 mg/dL) is considered a therapeutic  option.   Thyroid Panel With TSH     Status: None   Collection Time: 09/07/19 10:01 AM  Result Value Ref Range   T3 Uptake 33 22 - 35 %   T4, Total 7.8 5.1 - 11.9 mcg/dL   Free Thyroxine Index 2.6 1.4 - 3.8   TSH 3.56 mIU/L    Comment:           Reference Range .           > or = 20 Years  0.40-4.50 .                Pregnancy Ranges           First trimester    0.26-2.66           Second trimester   0.55-2.73           Third trimester    0.43-2.91   Urine Culture  Status: None   Collection Time: 09/07/19 10:36 AM   Specimen: Urine  Result Value Ref Range   MICRO NUMBER: [id number]    SPECIMEN QUALITY: Adequate    Sample Source URINE, CLEAN CATCH    STATUS: FINAL    Result:      Growth of mixed flora was isolated, suggesting probable contamination. No further testing will be performed. If clinically indicated, recollection using a method to minimize contamination, with prompt transfer to Urine Culture Transport Tube, is  recommended.   Cytology - PAP     Status: None   Collection Time: 09/14/19  4:31 PM  Result Value Ref Range   High risk HPV Negative    Adequacy      Satisfactory for evaluation; transformation zone component PRESENT.   Diagnosis      - Negative for intraepithelial lesion or malignancy (NILM)   Comment Normal Reference Range HPV - Negative     -------------------------------------------------------------------------- A&P:  Problem List Items Addressed This Visit      Other   Headache     S/P COVID vaccine x 1 day ago.  Reports symptom onset approx 10 hours ago with headache, body aches, sore throat, chills and fatigue.  Second dose of Moderna vaccine.  Has tried taking acetaminophen without relief of symptoms.  Discussed can take ibuprofen 800mg  along with benadryl 50mg  every 8 hours as needed, that this may make her drowsy so to avoid driving or operating heavy machinery.  Can gargle with warm salt water and/or apple cider vinegar.  Educated symptoms may last up to 72 hours from immunization.  Plan: 1. Symptom management with ibuprofen and benadryl, as discussed 2. Increase fluids and rest      Relevant Medications   acetaminophen (TYLENOL) 500 MG tablet   Body aches    See headache A/P      Sore throat    See headache A/P      Fatigue    See headache A/P         No orders of the defined types were placed in this encounter.   Follow-up: - Return as needed for this  Patient verbalizes understanding with the above medical recommendations including the limitation of remote medical advice.  Specific follow-up and call-back criteria were given for patient to follow-up or seek medical care more urgently if needed.  - Time spent in direct consultation with patient on phone: 5 minutes  [name], [facility] 11/10/2019, 3:05 PM

## 2019-11-10 NOTE — Telephone Encounter (Signed)
Patient states she got her second COVID vaccine yesterday. Patient states she feels overall bad- but the headache is not getting better. She request appointment with PCP to discuss her symptoms. Reason for Disposition . COVID-19 vaccine, systemic reactions (e.g., fatigue, fever, muscle aches), questions about  Answer Assessment - Initial Assessment Questions 1. MAIN CONCERN OR SYMPTOM:  "What is your main concern right now?" "What question do you have?" "What's the main symptom you're worried about?" (e.g., fever, pain, redness, swelling)     Headache, swelling in neck and throat- lymph nodes 2. VACCINE: "What vaccination did you receive?" "Is this your first or second shot?" (e.g., none; AstraZeneca, J&J, [place], [place], other)     Moderna 3. SYMPTOM ONSET: "When did the symptoms begin?" (e.g., not relevant; hours, days)      Last night 4. SYMPTOM SEVERITY: "How bad is it?"      Severe- headache 5. FEVER: "Is there a fever?" If so, ask: "What is it, how was it measured, and when did it start?"      Possible low grade temp- chills 6. PAST REACTIONS: "Have you reacted to immunizations before?" If so, ask: "What happened?"     No reaction 7. OTHER SYMPTOMS: "Do you have any other symptoms?"     Chills, fatigue  Protocols used: CORONAVIRUS (COVID-19) VACCINE QUESTIONS AND REACTIONS-A-AH

## 2019-11-10 NOTE — Telephone Encounter (Signed)
Patient called to review questions/ symptoms after receiving  2nd covid vaccine. Attempts x 3 made to review symptoms with patient. Left messages to call back.

## 2019-11-17 ENCOUNTER — Encounter: Payer: Self-pay | Admitting: Family Medicine

## 2019-11-24 ENCOUNTER — Telehealth (INDEPENDENT_AMBULATORY_CARE_PROVIDER_SITE_OTHER): Payer: Medicare Other | Admitting: Family Medicine

## 2019-11-24 ENCOUNTER — Encounter: Payer: Self-pay | Admitting: Family Medicine

## 2019-11-24 ENCOUNTER — Other Ambulatory Visit: Payer: Self-pay

## 2019-11-24 DIAGNOSIS — E559 Vitamin D deficiency, unspecified: Secondary | ICD-10-CM | POA: Diagnosis not present

## 2019-11-24 DIAGNOSIS — E039 Hypothyroidism, unspecified: Secondary | ICD-10-CM | POA: Diagnosis not present

## 2019-11-24 DIAGNOSIS — R5383 Other fatigue: Secondary | ICD-10-CM

## 2019-11-24 NOTE — Progress Notes (Signed)
Virtual Visit via Telephone  The purpose of this virtual visit is to provide medical care while limiting exposure to the novel coronavirus (COVID19) for both patient and office staff.  Consent was obtained for phone visit:  Yes.   Answered questions that patient had about telehealth interaction:  Yes.   I discussed the limitations, risks, security and privacy concerns of performing an evaluation and management service by telephone. I also discussed with the patient that there may be a patient responsible charge related to this service. The patient expressed understanding and agreed to proceed.  Patient is at home and is accessed via telephone Services are provided by [name], FNP-C from [facility])  ---------------------------------------------------------------------- Chief Complaint  Patient presents with  . Fatigue    since COVID vaccine 11/09/2019    S: Reviewed CMA documentation. I have called patient and gathered additional HPI as follows:  Brenda Grimes reports that she has been having fatigue since her second COVID vaccination on 11/09/2019.  Reports that she is waking up feeling fatigued, denies snoring.  Reports history of fatigue that was due to vitamin D deficiency in the past and history of hypothyroidism.  Discussed option for sleep study, patient agreeable but wants to wait for labs result before proceeding with that.  Patient is currently home Denies any high risk travel to areas of current concern for COVID19. Denies any known or suspected exposure to person with or possibly with COVID19.  Denies any fevers, chills, sweats, body ache, cough, shortness of breath, sinus pain or pressure, headache, abdominal pain, diarrhea  Past Medical History:  Diagnosis Date  . Arthritis   . CAD (coronary artery disease)    s/p stenting x 2  . Cancer ([place])    anus cancer  . Colon polyp   . Depression   . Hyperlipidemia   . MI (myocardial infarction)  ([place])   . Thyroid disease   . Vitamin D deficiency    Social History   Tobacco Use  . Smoking status: Former Smoker    Packs/day: 0.75    Years: 30.00    Pack years: 22.50    Quit date: 01/21/2007    Years since quitting: 12.8  . Smokeless tobacco: Never Used  Vaping Use  . Vaping Use: Never used  Substance Use Topics  . Alcohol use: No  . Drug use: No    Current Outpatient Medications:  .  acetaminophen (TYLENOL) 500 MG tablet, Take 1,000 mg by mouth every 6 (six) hours as needed., Disp: , Rfl:  .  buPROPion (WELLBUTRIN XL) 150 MG 24 hr tablet, Take 1 tablet by mouth once daily, Disp: 90 tablet, Rfl: 0 .  cyclobenzaprine (FLEXERIL) 10 MG tablet, Take 1 tablet (10 mg total) by mouth at bedtime as needed for muscle spasms., Disp: 30 tablet, Rfl: 1 .  fluticasone (FLONASE) 50 MCG/ACT nasal spray, 1 spray by Each Nare route daily., Disp: , Rfl:  .  levothyroxine (SYNTHROID) 88 MCG tablet, TAKE 1 TABLET BY MOUTH ONCE DAILY BEFORE BREAKFAST, Disp: 90 tablet, Rfl: 0 .  loperamide (IMODIUM) 2 MG capsule, Take 2 mg by mouth as needed for diarrhea or loose stools., Disp: , Rfl:  .  Multiple Vitamin (MULTIVITAMIN) tablet, Take 1 tablet by mouth daily. , Disp: , Rfl:  .  naproxen (NAPROSYN) 500 MG tablet, Take by mouth., Disp: , Rfl:  .  traMADol (ULTRAM) 50 MG tablet, TAKE 1 TABLET BY MOUTH TWICE DAILY AS NEEDED FOR  MODERATE  PAIN.  (ABDOMINAL  OR  RECTAL  PAIN), Disp: 60 tablet, Rfl: 0 .  cholecalciferol (VITAMIN D) 1000 units tablet, Take 1,000 Units by mouth daily.  (Patient not taking: Reported on 11/24/2019), Disp: , Rfl:  .  Flaxseed, Linseed, (FLAX SEED OIL) 1000 MG CAPS, Take by mouth.  (Patient not taking: Reported on 11/24/2019), Disp: , Rfl:  .  nitroGLYCERIN (NITROSTAT) 0.4 MG SL tablet, Place 1 tablet (0.4 mg total) under the tongue every 5 (five) minutes as needed for chest pain. (Patient not taking: Reported on 01/31/2018), Disp: 100 tablet, Rfl: 3  Depression screen [facility] 2/9  11/09/2019 09/07/2019 07/11/2019  Decreased Interest 0 0 0  Down, Depressed, Hopeless 0 0 0  PHQ - 2 Score 0 0 0  Altered sleeping - 0 -  Tired, decreased energy - 0 -  Change in appetite - 0 -  Feeling bad or failure about yourself  - 0 -  Trouble concentrating - 1 -  Moving slowly or fidgety/restless - 0 -  Suicidal thoughts - 0 -  PHQ-9 Score - 1 -  Difficult doing work/chores - Not difficult at all -    GAD 7 : Generalized Anxiety Score 06/02/2018 05/05/2018  Nervous, Anxious, on Edge 1 0  Control/stop worrying 3 0  Worry too much - different things 3 0  Trouble relaxing 3 0  Restless 1 0  Easily annoyed or irritable 1 0  Afraid - awful might happen 1 1  Total GAD 7 Score 13 1  Anxiety Difficulty Somewhat difficult Not difficult at all    -------------------------------------------------------------------------- O: No physical exam performed due to remote telephone encounter.  Physical Exam: Patient remotely monitored with video.  Verbal communication appropriate.  Cognition normal.  Recent Results (from the past 2160 hour(s))  POCT Urinalysis Dipstick     Status: Abnormal   Collection Time: 09/07/19  9:32 AM  Result Value Ref Range   Color, UA yellow    Clarity, UA clear    Glucose, UA Negative Negative   Bilirubin, UA negative    Ketones, UA negative    Spec Grav, UA 1.015 1.010 - 1.025   Blood, UA negative    pH, UA 7.0 5.0 - 8.0   Protein, UA Negative Negative   Urobilinogen, UA 0.2 0.2 or 1.0 E.U./dL   Nitrite, UA negative    Leukocytes, UA Trace (A) Negative   Appearance     Odor    CBC with Differential     Status: Abnormal   Collection Time: 09/07/19 10:01 AM  Result Value Ref Range   WBC 3.3 (L) 3.8 - 10.8 Thousand/uL   RBC 3.89 3.80 - 5.10 Million/uL   Hemoglobin 12.9 11.7 - 15.5 g/dL   HCT 38.6 35 - 45 %   MCV 99.2 80.0 - 100.0 fL   MCH 33.2 (H) 27.0 - 33.0 pg   MCHC 33.4 32.0 - 36.0 g/dL   RDW 12.6 11.0 - 15.0 %   Platelets 175 140 - 400  Thousand/uL   MPV 9.5 7.5 - 12.5 fL   Neutro Abs 1,914 1,500 - 7,800 cells/uL   Lymphs Abs 901 850 - 3,900 cells/uL   Absolute Monocytes 396 200 - 950 cells/uL   Eosinophils Absolute 59 15 - 500 cells/uL   Basophils Absolute 30 0 - 200 cells/uL   Neutrophils Relative % 58 %   Total Lymphocyte 27.3 %   Monocytes Relative 12.0 %   Eosinophils Relative 1.8 %   Basophils Relative 0.9 %  COMPLETE METABOLIC PANEL WITH GFR     Status: Abnormal   Collection Time: 09/07/19 10:01 AM  Result Value Ref Range   Glucose, Bld 87 65 - 99 mg/dL    Comment: .            Fasting reference interval .    BUN 17 7 - 25 mg/dL   Creat 0.74 0.50 - 1.05 mg/dL    Comment: For patients >41 years of age, the reference limit for Creatinine is approximately 13% higher for people identified as African-American. .    GFR, Est Non African American 91 > OR = 60 mL/min/1.33m   GFR, Est African American 106 > OR = 60 mL/min/1.7105m  BUN/Creatinine Ratio NOT APPLICABLE 6 - 22 (calc)   Sodium 140 135 - 146 mmol/L   Potassium 4.8 3.5 - 5.3 mmol/L   Chloride 106 98 - 110 mmol/L   CO2 30 20 - 32 mmol/L   Calcium 9.3 8.6 - 10.4 mg/dL   Total Protein 5.9 (L) 6.1 - 8.1 g/dL   Albumin 4.1 3.6 - 5.1 g/dL   Globulin 1.8 (L) 1.9 - 3.7 g/dL (calc)   AG Ratio 2.3 1.0 - 2.5 (calc)   Total Bilirubin 0.6 0.2 - 1.2 mg/dL   Alkaline phosphatase (APISO) 75 37 - 153 U/L   AST 18 10 - 35 U/L   ALT 17 6 - 29 U/L  Lipid Profile     Status: Abnormal   Collection Time: 09/07/19 10:01 AM  Result Value Ref Range   Cholesterol 183 <200 mg/dL   HDL 61 > OR = 50 mg/dL   Triglycerides 66 <150 mg/dL   LDL Cholesterol (Calc) 107 (H) mg/dL (calc)    Comment: Reference range: <100 . Desirable range <100 mg/dL for primary prevention;   <70 mg/dL for patients with CHD or diabetic patients  with > or = 2 CHD risk factors. . Marland KitchenDL-C is now calculated using the Martin-Hopkins  calculation, which is a validated novel method providing   better accuracy than the Friedewald equation in the  estimation of LDL-C.  MaCresenciano Genret al. JAAnnamaria [id number]  (http://education.[employer].com/faq/FAQ164)    Total CHOL/HDL Ratio 3.0 <5.0 (calc)   Non-HDL Cholesterol (Calc) 122 <130 mg/dL (calc)    Comment: For patients with diabetes plus 1 major ASCVD risk  factor, treating to a non-HDL-C goal of <100 mg/dL  (LDL-C of <70 mg/dL) is considered a therapeutic  option.   Thyroid Panel With TSH     Status: None   Collection Time: 09/07/19 10:01 AM  Result Value Ref Range   T3 Uptake 33 22 - 35 %   T4, Total 7.8 5.1 - 11.9 mcg/dL   Free Thyroxine Index 2.6 1.4 - 3.8   TSH 3.56 mIU/L    Comment:           Reference Range .           > or = 20 Years  0.40-4.50 .                Pregnancy Ranges           First trimester    0.26-2.66           Second trimester   0.55-2.73           Third trimester    0.43-2.91   Urine Culture     Status: None   Collection Time: 09/07/19 10:36 AM   Specimen: Urine  Result Value  Ref Range   MICRO NUMBER: [id number]    SPECIMEN QUALITY: Adequate    Sample Source URINE, CLEAN CATCH    STATUS: FINAL    Result:      Growth of mixed flora was isolated, suggesting probable contamination. No further testing will be performed. If clinically indicated, recollection using a method to minimize contamination, with prompt transfer to Urine Culture Transport Tube, is  recommended.   Cytology - PAP     Status: None   Collection Time: 09/14/19  4:31 PM  Result Value Ref Range   High risk HPV Negative    Adequacy      Satisfactory for evaluation; transformation zone component PRESENT.   Diagnosis      - Negative for intraepithelial lesion or malignancy (NILM)   Comment Normal Reference Range HPV - Negative     -------------------------------------------------------------------------- A&P:  Problem List Items Addressed This Visit      Endocrine   Hypothyroidism   Relevant Orders    Thyroid Panel With TSH     Other   Hypovitaminosis D   Relevant Orders   VITAMIN D 25 Hydroxy (Vit-D Deficiency, Fractures)   Fatigue - Primary    Fatigue that has worsened s/p 11/09/2019 COVID vaccination.  Reports history of fatigue with low levels of Vitamin D in the past.  Reports is waking up tired, discussed option for sleep study testing and wants to wait until lab work has resulted.  Discussed importance of increasing protein intake after a viral process, such as immunization.  Plan: 1. Labs for CBC, CMP14, thyroid, vitamin D and heavy metals labs ordered 2. To discuss sleep study if labs result WNL 3. RTC in 2 weeks if symptoms worsen or do not fail to improve      Relevant Orders   Thyroid Panel With TSH   Heavy Metals Profile II, Blood   CBC with Differential   CMP14+EGFR    Other Visit Diagnoses    Vitamin D deficiency       Relevant Orders   VITAMIN D 25 Hydroxy (Vit-D Deficiency, Fractures)      No orders of the defined types were placed in this encounter.   Follow-up: - Return in 2 weeks if symptoms worsen or fail to improve - Labs ordered and to be collected with [facility] today  Patient verbalizes understanding with the above medical recommendations including the limitation of remote medical advice.  Specific follow-up and call-back criteria were given for patient to follow-up or seek medical care more urgently if needed.  - Time spent in direct consultation with patient on video: 7 minutes  [name], [facility] 11/24/2019, 12:17 PM

## 2019-11-24 NOTE — Assessment & Plan Note (Addendum)
Fatigue that has worsened s/p 11/09/2019 COVID vaccination.  Reports history of fatigue with low levels of Vitamin D in the past.  Reports is waking up tired, discussed option for sleep study testing and wants to wait until lab work has resulted.  Discussed importance of increasing protein intake after a viral process, such as immunization.  Plan: 1. Labs for CBC, CMP14, thyroid, vitamin D and heavy metals labs ordered 2. To discuss sleep study if labs result WNL 3. RTC in 2 weeks if symptoms worsen or do not fail to improve

## 2019-11-29 ENCOUNTER — Other Ambulatory Visit: Payer: Self-pay | Admitting: Family Medicine

## 2019-11-29 DIAGNOSIS — F339 Major depressive disorder, recurrent, unspecified: Secondary | ICD-10-CM

## 2019-11-29 NOTE — Telephone Encounter (Signed)
Requested Prescriptions  Pending Prescriptions Disp Refills   buPROPion (WELLBUTRIN XL) 150 MG 24 hr tablet [Pharmacy Med Name: buPROPion HCl ER (XL) 150 MG Oral Tablet Extended Release 24 Hour] 90 tablet 0    Sig: Take 1 tablet by mouth once daily     Psychiatry: Antidepressants - bupropion Passed - 11/29/2019  4:18 PM      Passed - Completed PHQ-2 or PHQ-9 in the last 360 days.      Passed - Last BP in normal range    BP Readings from Last 1 Encounters:  11/09/19 100/61         Passed - Valid encounter within last 6 months    Recent Outpatient Visits          5 days ago Fatigue, unspecified type   [place], FNP   2 weeks ago Acute nonintractable headache, unspecified headache type   [place], FNP   2 weeks ago Chronic pain syndrome   [facility], Lupita Raider, FNP   2 months ago Gross hematuria   [place], FNP   7 months ago Acquired hypothyroidism   [place], [place]

## 2020-01-17 DIAGNOSIS — Z08 Encounter for follow-up examination after completed treatment for malignant neoplasm: Secondary | ICD-10-CM | POA: Diagnosis not present

## 2020-01-17 DIAGNOSIS — R198 Other specified symptoms and signs involving the digestive system and abdomen: Secondary | ICD-10-CM | POA: Diagnosis not present

## 2020-01-17 DIAGNOSIS — B369 Superficial mycosis, unspecified: Secondary | ICD-10-CM | POA: Diagnosis not present

## 2020-01-17 DIAGNOSIS — C210 Malignant neoplasm of anus, unspecified: Secondary | ICD-10-CM | POA: Diagnosis not present

## 2020-01-17 DIAGNOSIS — Z85048 Personal history of other malignant neoplasm of rectum, rectosigmoid junction, and anus: Secondary | ICD-10-CM | POA: Diagnosis not present

## 2020-01-28 ENCOUNTER — Other Ambulatory Visit: Payer: Self-pay | Admitting: Family Medicine

## 2020-01-28 DIAGNOSIS — G894 Chronic pain syndrome: Secondary | ICD-10-CM

## 2020-01-28 NOTE — Telephone Encounter (Signed)
Requested medication (s) are due for refill today: yes  Requested medication (s) are on the active medication list: yes  Last refill:  11/09/19  Future visit scheduled: no  Notes to clinic:  med not delegated to NT to RF   Requested Prescriptions  Pending Prescriptions Disp Refills   traMADol (ULTRAM) 50 MG tablet [Pharmacy Med Name: traMADol HCl 50 MG Oral Tablet] 60 tablet 0    Sig: TAKE 1 TABLET BY MOUTH TWICE DAILY AS NEEDED FOR MODERATE PAIN ( ABDOMINAL OR RECTAL PAIN)      Not Delegated - Analgesics:  Opioid Agonists Failed - 01/28/2020  6:01 PM      Failed - This refill cannot be delegated      Failed - Urine Drug Screen completed in last 360 days.      Passed - Valid encounter within last 6 months    Recent Outpatient Visits           2 months ago Fatigue, unspecified type   [place], FNP   2 months ago Acute nonintractable headache, unspecified headache type   [place], FNP   2 months ago Chronic pain syndrome   [facility], Lupita Raider, FNP   4 months ago Gross hematuria   [facility], Lupita Raider, FNP   9 months ago Acquired hypothyroidism   [place], Devonne Doughty, DO

## 2020-01-29 NOTE — Telephone Encounter (Signed)
Personally reviewed [place] today, 01/29/20.  According to PMP aware, pt last received a refill on 11/09/2019.  Refill of her controlled substance will be provided today.

## 2020-03-06 ENCOUNTER — Other Ambulatory Visit: Payer: Self-pay | Admitting: Family Medicine

## 2020-03-06 DIAGNOSIS — F339 Major depressive disorder, recurrent, unspecified: Secondary | ICD-10-CM

## 2020-03-19 ENCOUNTER — Other Ambulatory Visit: Payer: Self-pay | Admitting: Family Medicine

## 2020-03-19 DIAGNOSIS — G894 Chronic pain syndrome: Secondary | ICD-10-CM

## 2020-03-19 NOTE — Telephone Encounter (Signed)
Personally reviewed [place] today, 03/19/20.  According to PMP aware, pt last received a refill on 01/29/2020.  Refill of her controlled substance will be provided today

## 2020-03-19 NOTE — Telephone Encounter (Signed)
Requested medication (s) are due for refill today: yes  Requested medication (s) are on the active medication list:yes  Last refill: 01/29/20  #60  0 refills  Future visit scheduled: No  Notes to clinic:  Not delegated    Requested Prescriptions  Pending Prescriptions Disp Refills   traMADol (ULTRAM) 50 MG tablet [Pharmacy Med Name: traMADol HCl 50 MG Oral Tablet] 60 tablet 0    Sig: TAKE 1 TABLET BY MOUTH BY MOUTH TWICE DAILY AS NEEDED FOR MODERATE PAIN (ABDOMINAL OR RECTAL PAIN)      Not Delegated - Analgesics:  Opioid Agonists Failed - 03/19/2020 11:59 AM      Failed - This refill cannot be delegated      Failed - Urine Drug Screen completed in last 360 days      Passed - Valid encounter within last 6 months    Recent Outpatient Visits           3 months ago Fatigue, unspecified type   [facility], Lupita Raider, FNP   4 months ago Acute nonintractable headache, unspecified headache type   [facility], Lupita Raider, FNP   4 months ago Chronic pain syndrome   [facility], Lupita Raider, FNP   6 months ago Gross hematuria   [facility], Lupita Raider, FNP   11 months ago Acquired hypothyroidism   [place], Devonne Doughty, DO

## 2020-03-31 ENCOUNTER — Other Ambulatory Visit: Payer: Self-pay | Admitting: Family Medicine

## 2020-03-31 DIAGNOSIS — F339 Major depressive disorder, recurrent, unspecified: Secondary | ICD-10-CM

## 2020-04-04 ENCOUNTER — Other Ambulatory Visit: Payer: Self-pay

## 2020-04-04 ENCOUNTER — Ambulatory Visit (INDEPENDENT_AMBULATORY_CARE_PROVIDER_SITE_OTHER): Payer: Medicare Other | Admitting: Family Medicine

## 2020-04-04 ENCOUNTER — Encounter: Payer: Self-pay | Admitting: Family Medicine

## 2020-04-04 VITALS — BP 122/60 | Temp 98.7°F | Resp 17 | Ht 64.0 in | Wt 177.4 lb

## 2020-04-04 DIAGNOSIS — R5383 Other fatigue: Secondary | ICD-10-CM | POA: Diagnosis not present

## 2020-04-04 DIAGNOSIS — E039 Hypothyroidism, unspecified: Secondary | ICD-10-CM | POA: Diagnosis not present

## 2020-04-04 DIAGNOSIS — M722 Plantar fascial fibromatosis: Secondary | ICD-10-CM | POA: Insufficient documentation

## 2020-04-04 DIAGNOSIS — M2550 Pain in unspecified joint: Secondary | ICD-10-CM | POA: Diagnosis not present

## 2020-04-04 DIAGNOSIS — R1084 Generalized abdominal pain: Secondary | ICD-10-CM | POA: Diagnosis not present

## 2020-04-04 DIAGNOSIS — L989 Disorder of the skin and subcutaneous tissue, unspecified: Secondary | ICD-10-CM | POA: Diagnosis not present

## 2020-04-04 DIAGNOSIS — Z23 Encounter for immunization: Secondary | ICD-10-CM | POA: Diagnosis not present

## 2020-04-04 DIAGNOSIS — Z130 Encounter for screening for diseases of the blood and blood-forming organs and certain disorders involving the immune mechanism: Secondary | ICD-10-CM | POA: Diagnosis not present

## 2020-04-04 DIAGNOSIS — E785 Hyperlipidemia, unspecified: Secondary | ICD-10-CM | POA: Diagnosis not present

## 2020-04-04 NOTE — Assessment & Plan Note (Signed)
Encouraged exercises/stretching, using frozen water bottle and/or can of soup to stretch plantar fasciia.  Discussed referral to podiatry for injections and/or nighttime splinting.  Patient reports symptoms are interfering with work and would like referral to podiatry at this time.  Referral to podiatry placed.

## 2020-04-04 NOTE — Patient Instructions (Addendum)
Have your labs drawn and we will contact you with the results.  Contact your gastroenterology provider to discuss follow up testing for your history of anal cancer.  A referral to Podiatry for your plantar fasciitis has been placed today.  If you have not heard from the specialty office or our referral coordinator within 1 week, please let us know and we will follow up with the referral coordinator for an update.  A referral to Dermatology for your skin lesion has been placed today.  If you have not heard from the specialty office or our referral coordinator within 1 week, please let us know and we will follow up with the referral coordinator for an update.  We have given you your influenza vaccine today  We will plan to see you back if your symptoms worsen or fail to improve  You will receive a survey after today's visit either digitally by e-mail or paper by USPS mail. Your experiences and feedback matter to [place].  Please respond so we know how we are doing as we provide care for you.  Call us with any questions/concerns/needs.  It is my goal to be available to you for your health concerns.  Thanks for choosing me to be a partner in your healthcare needs!  [name], FNP-C Family Nurse Practitioner [facility] Phone: [phone]

## 2020-04-04 NOTE — Assessment & Plan Note (Signed)
Multiple joint/muscle pain.  Increased pain/tenderness/swelling in hands with new onset difficulty opening bottles over the past 6 months.  Will have labs for RH, CCP and ANA drawn.  Discussed if positive, will refer to rheumatology.  Patient in agreement and will have labs drawn today.

## 2020-04-04 NOTE — Progress Notes (Signed)
Subjective:    Patient ID: Brenda Grimes, female    DOB: 1965-09-19, 54 y.o.   MRN: [id number]  Brenda Grimes is a 54 y.o. female presenting on 04/04/2020 for Foot Pain (intermittent Lt heel pain x <1 mth. pt state the pain worsen after resting the foot and then when she take that first intial step the pain is worsen.  ) and Fatigue (intermittent joint & muscle pain. Pt state she feel like she is losing strength in her hands and legs. Difficulty opening with opening bottles x  6 mths )   HPI  Brenda Grimes presents to clinic for concerns of intermittent left heel pain x 1 month.  Pain is worst first thing in the morning when she gets out of bed and lasts for a while until she walks and the foot stretches a bit.  Denies any previous issue with her feet, ambulation, pain in the morning.  Has concerns for fatigue with intermittent joint pain and muscle pain.  Reports feeling like she has a loss of strength in her hands, having some difficulty opening bottles for the past 6 months.  Has not had any issues like this in the past.  Has pain in most joints throughout her body.  Has concerns for a skin lesion on her right upper back, that she feels is not healing.  Reports she does pick/fuss at the area, but has been unable to visualize it to see if it is changing in size/color.  Has not met with a dermatologist in many years.  Has concerns for generalized abdominal pain with diarrhea that she currently manages with immodium.  Has not followed with her GI provider and may possibly be overdue for her colon cancer screening following her anal cancer diagnosis.  Depression screen [facility] 2/9 11/09/2019 09/07/2019 07/11/2019  Decreased Interest 0 0 0  Down, Depressed, Hopeless 0 0 0  PHQ - 2 Score 0 0 0  Altered sleeping - 0 -  Tired, decreased energy - 0 -  Change in appetite - 0 -  Feeling bad or failure about yourself  - 0 -  Trouble concentrating - 1 -  Moving slowly or fidgety/restless - 0 -  Suicidal thoughts -  0 -  PHQ-9 Score - 1 -  Difficult doing work/chores - Not difficult at all -    Social History   Tobacco Use  . Smoking status: Former Smoker    Packs/day: 0.75    Years: 30.00    Pack years: 22.50    Quit date: 01/21/2007    Years since quitting: 13.2  . Smokeless tobacco: Never Used  Vaping Use  . Vaping Use: Never used  Substance Use Topics  . Alcohol use: No  . Drug use: No    Review of Systems  Constitutional: Positive for fatigue. Negative for activity change, appetite change, chills, diaphoresis, fever and unexpected weight change.  HENT: Negative.   Eyes: Negative.   Respiratory: Negative.   Cardiovascular: Negative.   Gastrointestinal: Positive for abdominal pain and diarrhea. Negative for abdominal distention, anal bleeding, blood in stool, constipation, nausea, rectal pain and vomiting.  Endocrine: Negative.   Genitourinary: Negative.   Musculoskeletal: Positive for arthralgias and myalgias. Negative for back pain, gait problem, joint swelling, neck pain and neck stiffness.  Skin: Negative.        Lesion  Allergic/Immunologic: Negative.   Neurological: Negative.   Hematological: Negative.   Psychiatric/Behavioral: Negative.    Per HPI unless specifically indicated above  Objective:    BP 122/60 (BP Location: Right Arm, Patient Position: Sitting, Cuff Size: Normal)   Temp 98.7 F (37.1 C) (Oral)   Resp 17   Ht 5' 4"  (1.626 m)   Wt 177 lb 6.4 oz (80.5 kg)   SpO2 96%   BMI 30.45 kg/m   Wt Readings from Last 3 Encounters:  04/04/20 177 lb 6.4 oz (80.5 kg)  11/09/19 168 lb 6.4 oz (76.4 kg)  09/14/19 168 lb (76.2 kg)    Physical Exam Vitals and nursing note reviewed.  Constitutional:      General: She is not in acute distress.    Appearance: Normal appearance. She is well-developed and well-groomed. She is obese. She is not ill-appearing or toxic-appearing.  HENT:     Head: Normocephalic and atraumatic.     Nose:     Comments: Brenda Grimes is in  place, covering mouth and nose. Eyes:     General: Lids are normal. Vision grossly intact.        Right eye: No discharge.        Left eye: No discharge.     Extraocular Movements: Extraocular movements intact.     Conjunctiva/sclera: Conjunctivae normal.     Pupils: Pupils are equal, round, and reactive to light.  Cardiovascular:     Rate and Rhythm: Normal rate and regular rhythm.     Pulses: Normal pulses.     Heart sounds: Normal heart sounds. No murmur heard.  No friction rub. No gallop.   Pulmonary:     Effort: Pulmonary effort is normal. No respiratory distress.     Breath sounds: Normal breath sounds.  Abdominal:     General: Abdomen is flat. Bowel sounds are normal. There is no distension.     Palpations: Abdomen is soft.     Tenderness: There is no abdominal tenderness. There is no guarding.  Musculoskeletal:        General: Swelling and tenderness present.       Feet:     Comments: Joints in bilateral hands with erythema and edema.   Feet:     Comments: Left plantar fasciitis. Skin:    General: Skin is warm and dry.     Capillary Refill: Capillary refill takes less than 2 seconds.     Findings: Lesion present.  Neurological:     General: No focal deficit present.     Mental Status: She is alert and oriented to person, place, and time.  Psychiatric:        Attention and Perception: Attention and perception normal.        Mood and Affect: Mood and affect normal.        Speech: Speech normal.        Behavior: Behavior normal. Behavior is cooperative.        Thought Content: Thought content normal.        Cognition and Memory: Cognition and memory normal.        Judgment: Judgment normal.    Results for orders placed or performed in visit on 09/14/19  Cytology - PAP  Result Value Ref Range   High risk HPV Negative    Adequacy      Satisfactory for evaluation; transformation zone component PRESENT.   Diagnosis      - Negative for intraepithelial lesion or  malignancy (NILM)   Comment Normal Reference Range HPV - Negative       Assessment & Plan:   Problem List Items Addressed This Visit  Musculoskeletal and Integument   Skin lesion    Skin lesion, approx 2cm on right upper back, with scabbing, color change throughout, unsure if this is due to scabbing/skin picking.  Since patient is unable to visualize to keep an eye on color/changes, will refer to dermatology for evaluation and removal.  Patient in agreement.      Relevant Orders   Ambulatory referral to Dermatology   Plantar fasciitis    Encouraged exercises/stretching, using frozen water bottle and/or can of soup to stretch plantar fasciia.  Discussed referral to podiatry for injections and/or nighttime splinting.  Patient reports symptoms are interfering with work and would like referral to podiatry at this time.  Referral to podiatry placed.      Relevant Orders   Ambulatory referral to Podiatry     Other   Arthralgia - Primary    Multiple joint/muscle pain.  Increased pain/tenderness/swelling in hands with new onset difficulty opening bottles over the past 6 months.  Will have labs for RH, CCP and ANA drawn.  Discussed if positive, will refer to rheumatology.  Patient in agreement and will have labs drawn today.      Relevant Orders   CBC with Differential   COMPLETE METABOLIC PANEL WITH GFR   Rheumatoid factor   Cyclic citrul peptide antibody, IgG   Antinuclear Antib (ANA)   Needs flu shot    Pt < age 89.  Needs annual influenza vaccine.  VIS provided.  Plan: 1. Administer Quad flu vaccine.       Relevant Orders   Flu Vaccine QUAD 6+ mos PF IM (Fluarix Quad PF) (Completed)   Generalized abdominal pain    Generalized abdominal pain with diarrhea for months.  Reports has been using immodium to manage her diarrhea.  Has not followed with her gastroenterology provider in a little while, is unsure of last visit or when she is due for next colon cancer screening, as she  has had anal cancer in the past.  Will have H. Pylori and other labs drawn today and encouraged to schedule appointment with gastroenterology for evaluation of abdominal pain, diarrhea and colon cancer screening.      Relevant Orders   H. pylori breath test      No orders of the defined types were placed in this encounter.  Follow up plan: Return if symptoms worsen or fail to improve.   [name], [facility] Nurse Practitioner [facility] 04/04/2020, 2:07 PM

## 2020-04-04 NOTE — Assessment & Plan Note (Signed)
Pt < age 54.  Needs annual influenza vaccine.  VIS provided.  Plan: 1. Administer Quad flu vaccine.

## 2020-04-04 NOTE — Assessment & Plan Note (Signed)
Skin lesion, approx 2cm on right upper back, with scabbing, color change throughout, unsure if this is due to scabbing/skin picking.  Since patient is unable to visualize to keep an eye on color/changes, will refer to dermatology for evaluation and removal.  Patient in agreement.

## 2020-04-04 NOTE — Assessment & Plan Note (Signed)
Generalized abdominal pain with diarrhea for months.  Reports has been using immodium to manage her diarrhea.  Has not followed with her gastroenterology provider in a little while, is unsure of last visit or when she is due for next colon cancer screening, as she has had anal cancer in the past.  Will have H. Pylori and other labs drawn today and encouraged to schedule appointment with gastroenterology for evaluation of abdominal pain, diarrhea and colon cancer screening.

## 2020-04-05 ENCOUNTER — Telehealth: Payer: Self-pay | Admitting: Family Medicine

## 2020-04-05 NOTE — Telephone Encounter (Signed)
Patient is calling to see if her lab results are back. Please advise CB- [phone]

## 2020-04-05 NOTE — Telephone Encounter (Signed)
Not all of the labs have resulted.  Awaiting all lab results.  TY

## 2020-04-05 NOTE — Telephone Encounter (Signed)
Attempted to contact the patient, no answer. VM not set-up.  °

## 2020-04-07 LAB — ANA: Anti Nuclear Antibody (ANA): NEGATIVE

## 2020-04-07 LAB — COMPLETE METABOLIC PANEL WITH GFR
AG Ratio: 2.2 (calc) (ref 1.0–2.5)
ALT: 14 U/L (ref 6–29)
AST: 20 U/L (ref 10–35)
Albumin: 4.4 g/dL (ref 3.6–5.1)
Alkaline phosphatase (APISO): 92 U/L (ref 37–153)
BUN: 18 mg/dL (ref 7–25)
CO2: 23 mmol/L (ref 20–32)
Calcium: 9.2 mg/dL (ref 8.6–10.4)
Chloride: 107 mmol/L (ref 98–110)
Creat: 0.83 mg/dL (ref 0.50–1.05)
GFR, Est African American: 91 mL/min/{1.73_m2} (ref >=60)
GFR, Est Non African American: 79 mL/min/{1.73_m2} (ref >=60)
Globulin: 2 g/dL (calc) (ref 1.9–3.7)
Glucose, Bld: 96 mg/dL (ref 65–99)
Potassium: 4.7 mmol/L (ref 3.5–5.3)
Sodium: 142 mmol/L (ref 135–146)
Total Bilirubin: 0.5 mg/dL (ref 0.2–1.2)
Total Protein: 6.4 g/dL (ref 6.1–8.1)

## 2020-04-07 LAB — CBC WITH DIFFERENTIAL/PLATELET
Absolute Monocytes: 314 cells/uL (ref 200–950)
Basophils Absolute: 39 cells/uL (ref 0–200)
Basophils Relative: 1.4 %
Eosinophils Absolute: 62 cells/uL (ref 15–500)
Eosinophils Relative: 2.2 %
HCT: 40.4 % (ref 35.0–45.0)
Hemoglobin: 13.7 g/dL (ref 11.7–15.5)
Lymphs Abs: 860 cells/uL (ref 850–3900)
MCH: 33.4 pg — ABNORMAL HIGH (ref 27.0–33.0)
MCHC: 33.9 g/dL (ref 32.0–36.0)
MCV: 98.5 fL (ref 80.0–100.0)
MPV: 9.7 fL (ref 7.5–12.5)
Monocytes Relative: 11.2 %
Neutro Abs: 1526 cells/uL (ref 1500–7800)
Neutrophils Relative %: 54.5 %
Platelets: 195 10*3/uL (ref 140–400)
RBC: 4.1 10*6/uL (ref 3.80–5.10)
RDW: 12.3 % (ref 11.0–15.0)
Total Lymphocyte: 30.7 %
WBC: 2.8 10*3/uL — ABNORMAL LOW (ref 3.8–10.8)

## 2020-04-07 LAB — H. PYLORI BREATH TEST: H. pylori Breath Test: NOT DETECTED

## 2020-04-07 LAB — CYCLIC CITRUL PEPTIDE ANTIBODY, IGG: Cyclic Citrullin Peptide Ab: <16 UNITS

## 2020-04-07 LAB — TSH: TSH: 1.28 mIU/L (ref 0.40–4.50)

## 2020-04-08 ENCOUNTER — Encounter: Payer: Self-pay | Admitting: Family Medicine

## 2020-04-09 ENCOUNTER — Other Ambulatory Visit: Payer: Self-pay | Admitting: Family Medicine

## 2020-04-09 DIAGNOSIS — D72818 Other decreased white blood cell count: Secondary | ICD-10-CM

## 2020-04-09 NOTE — Telephone Encounter (Signed)
The pt was notified of her lab results through her mychart.

## 2020-04-11 ENCOUNTER — Ambulatory Visit (INDEPENDENT_AMBULATORY_CARE_PROVIDER_SITE_OTHER): Payer: Medicare Other | Admitting: Podiatry

## 2020-04-11 ENCOUNTER — Ambulatory Visit (INDEPENDENT_AMBULATORY_CARE_PROVIDER_SITE_OTHER): Payer: Medicare Other

## 2020-04-11 ENCOUNTER — Other Ambulatory Visit: Payer: Self-pay

## 2020-04-11 ENCOUNTER — Encounter: Payer: Self-pay | Admitting: Podiatry

## 2020-04-11 DIAGNOSIS — M722 Plantar fascial fibromatosis: Secondary | ICD-10-CM | POA: Diagnosis not present

## 2020-04-11 MED ORDER — TRIAMCINOLONE ACETONIDE 10 MG/ML IJ SUSP
10.0000 mg | Freq: Once | INTRAMUSCULAR | Status: AC
  Administered 2020-04-11: 10 mg via INTRA_ARTICULAR

## 2020-04-11 NOTE — Progress Notes (Signed)
Subjective:  Patient ID: Brenda Grimes, female    DOB: 14-Oct-1965,  MRN: [id number]  Chief Complaint  Patient presents with  . Foot Pain    Patient presents today for left heel pain x 2-3 months.  She says its the worst in the mornings and it feels like she is walking on a deep bruise.  She has tried stretching and ice for relief    54 y.o. female presents with the above complaint.  Patient presents with complaint of left heel pain that has been there for 2 to 3 months.  Patient states it hurts in the morning there is feels like she is walking a bruise.  Patient has tried stretching icing different shoes but none of that has helped.  Patient states that the sharp stabbing pain keeps her awake.  She has not seen them as prior to see me.  She would like to discuss treatment options.   Review of Systems: Negative except as noted in the HPI. Denies N/V/F/Ch.  Past Medical History:  Diagnosis Date  . Arthritis   . CAD (coronary artery disease)    s/p stenting x 2  . Cancer ([place])    anus cancer  . Colon polyp   . Depression   . Hyperlipidemia   . MI (myocardial infarction) ([place])   . Thyroid disease   . Vitamin D deficiency     Current Outpatient Medications:  .  acetaminophen (TYLENOL) 500 MG tablet, Take 1,000 mg by mouth every 6 (six) hours as needed., Disp: , Rfl:  .  buPROPion (WELLBUTRIN XL) 150 MG 24 hr tablet, Take 1 tablet by mouth once daily, Disp: 90 tablet, Rfl: 0 .  cholecalciferol (VITAMIN D) 1000 units tablet, Take 1,000 Units by mouth daily. , Disp: , Rfl:  .  cyclobenzaprine (FLEXERIL) 10 MG tablet, Take 1 tablet (10 mg total) by mouth at bedtime as needed for muscle spasms., Disp: 30 tablet, Rfl: 1 .  Flaxseed, Linseed, (FLAX SEED OIL) 1000 MG CAPS, Take by mouth. , Disp: , Rfl:  .  fluticasone (FLONASE) 50 MCG/ACT nasal spray, 1 spray by Each Nare route daily., Disp: , Rfl:  .  levothyroxine (SYNTHROID) 88 MCG tablet, TAKE 1 TABLET BY MOUTH ONCE DAILY BEFORE  BREAKFAST, Disp: 90 tablet, Rfl: 0 .  loperamide (IMODIUM) 2 MG capsule, Take 2 mg by mouth as needed for diarrhea or loose stools., Disp: , Rfl:  .  Multiple Vitamin (MULTIVITAMIN) tablet, Take 1 tablet by mouth daily. , Disp: , Rfl:  .  naproxen (NAPROSYN) 500 MG tablet, Take by mouth., Disp: , Rfl:  .  nitroGLYCERIN (NITROSTAT) 0.4 MG SL tablet, Place 1 tablet (0.4 mg total) under the tongue every 5 (five) minutes as needed for chest pain. (Patient not taking: Reported on 01/31/2018), Disp: 100 tablet, Rfl: 3 .  traMADol (ULTRAM) 50 MG tablet, TAKE 1 TABLET BY MOUTH BY MOUTH TWICE DAILY AS NEEDED FOR MODERATE PAIN (ABDOMINAL OR RECTAL PAIN), Disp: 60 tablet, Rfl: 0  Social History   Tobacco Use  Smoking Status Former Smoker  . Packs/day: 0.75  . Years: 30.00  . Pack years: 22.50  . Quit date: 01/21/2007  . Years since quitting: 13.2  Smokeless Tobacco Never Used    Allergies  Allergen Reactions  . Sulfa Antibiotics Rash  . Sulfasalazine Rash  . Tape Rash   Objective:  There were no vitals filed for this visit. There is no height or weight on file to calculate BMI. Constitutional Well developed.  Well nourished.  Vascular Dorsalis pedis pulses palpable bilaterally. Posterior tibial pulses palpable bilaterally. Capillary refill normal to all digits.  No cyanosis or clubbing noted. Pedal hair growth normal.  Neurologic Normal speech. Oriented to person, place, and time. Epicritic sensation to light touch grossly present bilaterally.  Dermatologic Nails well groomed and normal in appearance. No open wounds. No skin lesions.  Orthopedic: Normal joint ROM without pain or crepitus bilaterally. No visible deformities. Tender to palpation at the calcaneal tuber left. No pain with calcaneal squeeze left. Ankle ROM diminished range of motion left. Silfverskiold Test: positive left.   Radiographs: Taken and reviewed. No acute fractures or dislocations. No evidence of stress  fracture.  Plantar heel spur present. Posterior heel spur present.   Assessment:   1. Plantar fasciitis of left foot    Plan:  Patient was evaluated and treated and all questions answered.  Plantar Fasciitis, left - XR reviewed as above.  - Educated on icing and stretching. Instructions given.  - Injection delivered to the plantar fascia as below. - DME: Plantar Fascial Brace - Pharmacologic management: None  Procedure: Injection Tendon/Ligament Location: Left plantar fascia at the glabrous junction; medial approach. Skin Prep: alcohol Injectate: 0.5 cc 0.5% marcaine plain, 0.5 cc of 1% Lidocaine, 0.5 cc kenalog 10. Disposition: Patient tolerated procedure well. Injection site dressed with a band-aid.  No follow-ups on file.

## 2020-04-15 ENCOUNTER — Other Ambulatory Visit: Payer: Self-pay

## 2020-04-15 ENCOUNTER — Encounter: Payer: Self-pay | Admitting: Oncology

## 2020-04-15 ENCOUNTER — Inpatient Hospital Stay: Payer: Medicare Other

## 2020-04-15 ENCOUNTER — Inpatient Hospital Stay: Payer: Medicare Other | Attending: Oncology | Admitting: Oncology

## 2020-04-15 VITALS — BP 116/69 | HR 79 | Temp 98.5°F | Wt 177.2 lb

## 2020-04-15 DIAGNOSIS — Z803 Family history of malignant neoplasm of breast: Secondary | ICD-10-CM | POA: Insufficient documentation

## 2020-04-15 DIAGNOSIS — Z87891 Personal history of nicotine dependence: Secondary | ICD-10-CM | POA: Diagnosis not present

## 2020-04-15 DIAGNOSIS — R109 Unspecified abdominal pain: Secondary | ICD-10-CM

## 2020-04-15 DIAGNOSIS — R1012 Left upper quadrant pain: Secondary | ICD-10-CM

## 2020-04-15 DIAGNOSIS — Z85048 Personal history of other malignant neoplasm of rectum, rectosigmoid junction, and anus: Secondary | ICD-10-CM | POA: Insufficient documentation

## 2020-04-15 DIAGNOSIS — D72810 Lymphocytopenia: Secondary | ICD-10-CM

## 2020-04-15 LAB — TECHNOLOGIST SMEAR REVIEW: Plt Morphology: ADEQUATE

## 2020-04-15 LAB — CBC WITH DIFFERENTIAL/PLATELET
Abs Immature Granulocytes: 0.02 10*3/uL (ref 0.00–0.07)
Basophils Absolute: 0 10*3/uL (ref 0.0–0.1)
Basophils Relative: 1 %
Eosinophils Absolute: 0.1 10*3/uL (ref 0.0–0.5)
Eosinophils Relative: 3 %
HCT: 39.5 % (ref 36.0–46.0)
Hemoglobin: 13.1 g/dL (ref 12.0–15.0)
Immature Granulocytes: 1 %
Lymphocytes Relative: 25 %
Lymphs Abs: 1 10*3/uL (ref 0.7–4.0)
MCH: 33.2 pg (ref 26.0–34.0)
MCHC: 33.2 g/dL (ref 30.0–36.0)
MCV: 100 fL (ref 80.0–100.0)
Monocytes Absolute: 0.3 10*3/uL (ref 0.1–1.0)
Monocytes Relative: 9 %
Neutro Abs: 2.4 10*3/uL (ref 1.7–7.7)
Neutrophils Relative %: 61 %
Platelets: 180 10*3/uL (ref 150–400)
RBC: 3.95 MIL/uL (ref 3.87–5.11)
RDW: 12.6 % (ref 11.5–15.5)
WBC: 3.8 10*3/uL — ABNORMAL LOW (ref 4.0–10.5)
nRBC: 0 % (ref 0.0–0.2)

## 2020-04-15 LAB — FOLATE: Folate: 12.6 ng/mL (ref >5.9)

## 2020-04-15 LAB — VITAMIN B12: Vitamin B-12: 219 pg/mL (ref 180–914)

## 2020-04-15 NOTE — Progress Notes (Signed)
Hematology/Oncology Consult note [facility] Telephone:([phone] [phone]  Patient Care Team: Verl Bangs, FNP as PCP - General (Family Medicine)   Name of the patient: Brenda Grimes  [id number]  [birth year]    Reason for referral-leukopenia   Referring [place] FNP  Date of visit: 04/15/20   History of presenting illness-patient is a 55 year old female with a past medical history significant for anal cancer s/p concurrent chemoradiation in 2014.  She has now been referred to [place] for lymphopenia.  Looking back at her prior CBCs patient had a normal white cell count Until she received chemo radiation for her anal cancer but since then she has had intermittent leukopenia/lymphopenia with a white cell count that fluctuates between 2.5-3.5.  Hemoglobin and platelets have remained normal.  Patient reports normal weight and appetite although she feels that she is losing muscle mass.  Reports ongoing fatigue.  Reports left upper quadrant abdominal pain that has been on and off for the last 1 year.  Bowel movements are regular.  Denies any urinary symptoms.  Abdominal pain comes and goes and is not associated with eating.  Denies any trauma.  It is dull aching and nonradiating  ECOG PS- 1  Pain scale- 4   Review of systems- Review of Systems  Constitutional: Positive for malaise/fatigue. Negative for chills, fever and weight loss.  HENT: Negative for congestion, ear discharge and nosebleeds.   Eyes: Negative for blurred vision.  Respiratory: Negative for cough, hemoptysis, sputum production, shortness of breath and wheezing.   Cardiovascular: Negative for chest pain, palpitations, orthopnea and claudication.  Gastrointestinal: Positive for abdominal pain. Negative for blood in stool, constipation, diarrhea, heartburn, melena, nausea and vomiting.  Genitourinary: Negative for dysuria, flank pain, frequency, hematuria and urgency.   Musculoskeletal: Negative for back pain, joint pain and myalgias.  Skin: Negative for rash.  Neurological: Negative for dizziness, tingling, focal weakness, seizures, weakness and headaches.  Endo/Heme/Allergies: Does not bruise/bleed easily.  Psychiatric/Behavioral: Negative for depression and suicidal ideas. The patient does not have insomnia.     Allergies  Allergen Reactions  . Sulfa Antibiotics Rash  . Sulfasalazine Rash  . Tape Rash    Patient Active Problem List   Diagnosis Date Noted  . Arthralgia 04/04/2020  . Needs flu shot 04/04/2020  . Generalized abdominal pain 04/04/2020  . Skin lesion 04/04/2020  . Plantar fasciitis 04/04/2020  . Headache 11/10/2019  . Body aches 11/10/2019  . Sore throat 11/10/2019  . Fatigue 11/10/2019  . Diarrhea 11/09/2019  . Postmenopausal vaginal bleeding 09/07/2019  . Rotator cuff syndrome of right shoulder 09/07/2019  . Fusion of vulva 02/01/2019  . Chronic SI joint pain 06/30/2018  . Musculoskeletal pain 06/30/2018  . Chronic pain syndrome 06/30/2018  . Myalgia due to statin 05/05/2018  . Obesity (BMI 30-39.9) 05/05/2018  . Dyslipidemia 06/21/2017  . Reduced libido 06/14/2017  . Cancer related pain 01/21/2017  . Hypovitaminosis D 09/25/2016  . Depression, recurrent ([place]) 12/17/2014  . Vaginal stenosis 12/19/2013  . Trochanteric bursitis of both hips 12/23/2012  . 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 Anal cancer ([place]) 07/20/2012  . Coronary artery disease involving native coronary artery of native heart with angina pectoris ([place]) 08/27/2010  . DDD (degenerative disc disease), lumbosacral 07/28/2010  . Hypothyroidism 02/06/2010     Past Medical History:  Diagnosis Date  . Arthritis   . CAD (coronary artery disease)    s/p stenting x 2  . Cancer ([place])    anus  cancer  . Colon polyp   . Depression   . Hyperlipidemia   . MI (myocardial infarction) ([place])   . Thyroid disease   . Vitamin D deficiency      Past  Surgical History:  Procedure Laterality Date  . CARDIAC SURGERY    . CORONARY ANGIOPLASTY WITH STENT PLACEMENT    . TUBAL LIGATION      Social History   Socioeconomic History  . Marital status: Married    Spouse name: Not on file  . Number of children: Not on file  . Years of education: Not on file  . Highest education level: Not on file  Occupational History  . Not on file  Tobacco Use  . Smoking status: Former Smoker    Packs/day: 0.75    Years: 30.00    Pack years: 22.50    Quit date: 01/21/2007    Years since quitting: 13.2  . Smokeless tobacco: Never Used  Vaping Use  . Vaping Use: Never used  Substance and Sexual Activity  . Alcohol use: No  . Drug use: No  . Sexual activity: Yes    Birth control/protection: None  Other Topics Concern  . Not on file  Social History Narrative  . Not on file   Social Determinants of Health   Financial Resource Strain: Not on file  Food Insecurity: Not on file  Transportation Needs: Not on file  Physical Activity: Not on file  Stress: Not on file  Social Connections: Not on file  Intimate Partner Violence: Not on file     Family History  Problem Relation Age of Onset  . Alzheimer's disease Mother   . CAD Father   . Alzheimer's disease Maternal Grandmother   . Thyroid disease Brother   . Hyperlipidemia Brother   . Hyperlipidemia Brother   . Alcohol abuse Brother   . Healthy Daughter   . Healthy Son   . Heart disease Paternal Uncle   . Heart disease Paternal Grandmother   . Heart disease Paternal Grandfather   . Thyroid disease Brother   . Cirrhosis Brother   . Healthy Son   . Breast cancer Cousin      Current Outpatient Medications:  .  acetaminophen (TYLENOL) 500 MG tablet, Take 1,000 mg by mouth every 6 (six) hours as needed., Disp: , Rfl:  .  buPROPion (WELLBUTRIN XL) 150 MG 24 hr tablet, Take 1 tablet by mouth once daily, Disp: 90 tablet, Rfl: 0 .  cholecalciferol (VITAMIN D) 1000 units tablet, Take 1,000  Units by mouth daily. , Disp: , Rfl:  .  cyclobenzaprine (FLEXERIL) 10 MG tablet, Take 1 tablet (10 mg total) by mouth at bedtime as needed for muscle spasms., Disp: 30 tablet, Rfl: 1 .  Flaxseed, Linseed, (FLAX SEED OIL) 1000 MG CAPS, Take 1 capsule by mouth daily., Disp: , Rfl:  .  fluticasone (FLONASE) 50 MCG/ACT nasal spray, Place 1 spray into both nostrils daily as needed., Disp: , Rfl:  .  levothyroxine (SYNTHROID) 88 MCG tablet, TAKE 1 TABLET BY MOUTH ONCE DAILY BEFORE BREAKFAST, Disp: 90 tablet, Rfl: 0 .  loperamide (IMODIUM) 2 MG capsule, Take 2 mg by mouth as needed for diarrhea or loose stools., Disp: , Rfl:  .  Multiple Vitamin (MULTIVITAMIN) tablet, Take 1 tablet by mouth daily. , Disp: , Rfl:  .  traMADol (ULTRAM) 50 MG tablet, TAKE 1 TABLET BY MOUTH BY MOUTH TWICE DAILY AS NEEDED FOR MODERATE PAIN (ABDOMINAL OR RECTAL PAIN), Disp: 60 tablet,  Rfl: 0 .  naproxen (NAPROSYN) 500 MG tablet, Take 500 mg by mouth 3 (three) times daily as needed., Disp: , Rfl:  .  nitroGLYCERIN (NITROSTAT) 0.4 MG SL tablet, Place 1 tablet (0.4 mg total) under the tongue every 5 (five) minutes as needed for chest pain. (Patient not taking: Reported on 01/31/2018), Disp: 100 tablet, Rfl: 3   Physical exam:  Vitals:   04/15/20 1050  BP: 116/69  Pulse: 79  Temp: 98.5 F (36.9 C)  TempSrc: Tympanic  SpO2: 99%  Weight: 177 lb 3.2 oz (80.4 kg)   Physical Exam Constitutional:      General: She is not in acute distress. Eyes:     Extraocular Movements: EOM normal.  Cardiovascular:     Rate and Rhythm: Normal rate and regular rhythm.     Heart sounds: Normal heart sounds.  Pulmonary:     Effort: Pulmonary effort is normal.     Breath sounds: Normal breath sounds.  Abdominal:     General: Bowel sounds are normal.     Palpations: Abdomen is soft.     Comments: Tenderness to palpation below the rib cage along the midaxillary line in the left upper quadrant.  No palpable hepatosplenomegaly   Lymphadenopathy:     Comments: No palpable cervical, supraclavicular, axillary or inguinal adenopathy   Skin:    General: Skin is warm and dry.  Neurological:     Mental Status: She is alert and oriented to person, place, and time.        CMP Latest Ref Rng & Units 04/04/2020  Glucose 65 - 99 mg/dL 96  BUN 7 - 25 mg/dL 18  Creatinine 0.50 - 1.05 mg/dL 0.83  Sodium 135 - 146 mmol/L 142  Potassium 3.5 - 5.3 mmol/L 4.7  Chloride 98 - 110 mmol/L 107  CO2 20 - 32 mmol/L 23  Calcium 8.6 - 10.4 mg/dL 9.2  Total Protein 6.1 - 8.1 g/dL 6.4  Total Bilirubin 0.2 - 1.2 mg/dL 0.5  AST 10 - 35 U/L 20  ALT 6 - 29 U/L 14   CBC Latest Ref Rng & Units 04/15/2020  WBC 4.0 - 10.5 K/uL 3.8(L)  Hemoglobin 12.0 - 15.0 g/dL 13.1  Hematocrit 36.0 - 46.0 % 39.5  Platelets 150 - 400 K/uL 180    No images are attached to the encounter.  DG Foot Complete Left  Result Date: 04/11/2020 Please see detailed radiograph report in office note.   Assessment and plan- Patient is a 54 y.o. female with prior history of anal cancer in 2014 referred for lymphopenia  Patient has had chronic leukopenia/lymphopenia since 2014 after she received 5-FU mitomycin for anal cancer and I suspect this is prolonged for bone marrow suppression due to treatment.  It is no worse today as compared to what it was in 2014 and 2015.  There is no other cytopenias such as anemia or thrombocytopenia.  She does not require a bone marrow biopsy at this time and this can be monitored conservatively.  I will check a CBC with differential, smear review B12 and folate today.  Left upper quadrant abdominal pain: This has been on and off for the last 1 year.  I will obtain a CT abdomen and pelvis with contrast to evaluate this further especially in the setting of prior renal cancer.  History of anal cancer: She follows up with radiation oncology at [facility] for this   Thank you for this kind referral and the opportunity to participate in the  care of this patient   Visit Diagnosis 1. Lymphopenia   2. Side pain   3. History of rectal or anal cancer     Dr. Randa Evens, MD, MPH [facility] [phone] 04/15/2020 1:42 PM

## 2020-04-15 NOTE — Progress Notes (Signed)
Pt has previous anus cancer about 8 years ago. She has had diarrhea since then also. She has not had any recent infections that she knows of. Has some neuropathy of bottom of feet.

## 2020-04-27 ENCOUNTER — Encounter: Payer: Self-pay | Admitting: Family Medicine

## 2020-04-29 ENCOUNTER — Other Ambulatory Visit: Payer: Self-pay

## 2020-04-29 ENCOUNTER — Ambulatory Visit
Admission: RE | Admit: 2020-04-29 | Discharge: 2020-04-29 | Disposition: A | Discharge status: Home / Self Care | Payer: Medicare Other | Source: Ambulatory Visit | Attending: Oncology | Admitting: Oncology

## 2020-04-29 DIAGNOSIS — N200 Calculus of kidney: Secondary | ICD-10-CM | POA: Diagnosis not present

## 2020-04-29 DIAGNOSIS — R109 Unspecified abdominal pain: Secondary | ICD-10-CM | POA: Insufficient documentation

## 2020-04-29 DIAGNOSIS — D72810 Lymphocytopenia: Secondary | ICD-10-CM | POA: Diagnosis not present

## 2020-04-29 DIAGNOSIS — Z85048 Personal history of other malignant neoplasm of rectum, rectosigmoid junction, and anus: Secondary | ICD-10-CM

## 2020-04-29 MED ORDER — IOHEXOL 300 MG/ML  SOLN
100.0000 mL | Freq: Once | INTRAMUSCULAR | Status: AC | PRN
  Administered 2020-04-29: 100 mL via INTRAVENOUS

## 2020-05-02 ENCOUNTER — Inpatient Hospital Stay (HOSPITAL_BASED_OUTPATIENT_CLINIC_OR_DEPARTMENT_OTHER): Payer: Medicare Other | Admitting: Oncology

## 2020-05-02 ENCOUNTER — Encounter: Payer: Self-pay | Admitting: Oncology

## 2020-05-02 DIAGNOSIS — E538 Deficiency of other specified B group vitamins: Secondary | ICD-10-CM

## 2020-05-02 DIAGNOSIS — D72810 Lymphocytopenia: Secondary | ICD-10-CM

## 2020-05-02 NOTE — Progress Notes (Signed)
appt to go over scan. Pt has no concerns at this time

## 2020-05-06 ENCOUNTER — Other Ambulatory Visit: Payer: Medicare Other

## 2020-05-06 ENCOUNTER — Telehealth: Payer: Self-pay

## 2020-05-06 ENCOUNTER — Other Ambulatory Visit: Payer: Self-pay

## 2020-05-06 NOTE — Telephone Encounter (Signed)
The pt notified that she can come today at 11:45am to get tested.

## 2020-05-06 NOTE — Telephone Encounter (Signed)
Copied from CRM [id number]. Topic: General - Other >> May 06, 2020  9:18 AM Jaquita Rector A wrote: Reason for CRM: Patient called in to say to [facility] or Dr Kirtland Bouchard  that she took a Covid test on 05/04/20 and is positive for Covid but that her job will not accept the home test. Patient is not sure what to do she is too sick to go out and get tested and there are no available testing slots through [facility] at the moment in the area. Please advise can be reached at  Ph# [phone]

## 2020-05-06 NOTE — Telephone Encounter (Signed)
Ok to do drive up covid testing here at [facility] today, spoke with Laurel Dimmer CMA who will help coordinate / schedule this.  Saralyn Pilar, DO [facility] 05/06/2020, 10:09 AM

## 2020-05-06 NOTE — Addendum Note (Signed)
Addended by: Lonna Cobb on: 05/06/2020 11:58 AM   Modules accepted: Orders

## 2020-05-06 NOTE — Progress Notes (Signed)
I connected with Brenda Grimes on 05/06/20 at  2:45 PM EST by video enabled telemedicine visit and verified that I am speaking with the correct person using two identifiers.   I discussed the limitations, risks, security and privacy concerns of performing an evaluation and management service by telemedicine and the availability of in-person appointments. I also discussed with the patient that there may be a patient responsible charge related to this service. The patient expressed understanding and agreed to proceed.  Other persons participating in the visit and their role in the encounter:  none  Patient's location:  home Provider's location:  work  Stage manager Complaint:  Leukopenia likely benign  History of present illness: patient is a 55 year old female with a past medical history significant for anal cancer s/p concurrent chemoradiation in 2014.  She has now been referred to [place] for lymphopenia.  Looking back at her prior CBCs patient had a normal white cell count Until she received chemo radiation for her anal cancer but since then she has had intermittent leukopenia/lymphopenia with a white cell count that fluctuates between 2.5-3.5.  Hemoglobin and platelets have remained normal.  Results of blood work from 04/15/2020 were as follows: CBC showed white count of 3.8, H&H of 13.1/29.5 with an MCV of 100 and a platelet count of 180.  B12 levels were low at 219.  Smear review was unremarkable.  Folate levels were normal.  Interval history she is doing well other than mild fatigue she denies other complaints at this time   Review of Systems  Constitutional: Positive for malaise/fatigue. Negative for chills, fever and weight loss.  HENT: Negative for congestion, ear discharge and nosebleeds.   Eyes: Negative for blurred vision.  Respiratory: Negative for cough, hemoptysis, sputum production, shortness of breath and wheezing.   Cardiovascular: Negative for chest pain, palpitations, orthopnea and  claudication.  Gastrointestinal: Negative for abdominal pain, blood in stool, constipation, diarrhea, heartburn, melena, nausea and vomiting.  Genitourinary: Negative for dysuria, flank pain, frequency, hematuria and urgency.  Musculoskeletal: Negative for back pain, joint pain and myalgias.  Skin: Negative for rash.  Neurological: Negative for dizziness, tingling, focal weakness, seizures, weakness and headaches.  Endo/Heme/Allergies: Does not bruise/bleed easily.  Psychiatric/Behavioral: Negative for depression and suicidal ideas. The patient does not have insomnia.     Allergies  Allergen Reactions  . Sulfa Antibiotics Rash  . Sulfasalazine Rash  . Tape Rash    Past Medical History:  Diagnosis Date  . Arthritis   . CAD (coronary artery disease)    s/p stenting x 2  . Cancer ([facility])    anus cancer  . Colon polyp   . Depression   . Hyperlipidemia   . MI (myocardial infarction) ([facility])   . Thyroid disease   . Vitamin D deficiency     Past Surgical History:  Procedure Laterality Date  . CARDIAC SURGERY    . CORONARY ANGIOPLASTY WITH STENT PLACEMENT    . TUBAL LIGATION      Social History   Socioeconomic History  . Marital status: Married    Spouse name: Not on file  . Number of children: Not on file  . Years of education: Not on file  . Highest education level: Not on file  Occupational History  . Not on file  Tobacco Use  . Smoking status: Former Smoker    Packs/day: 0.75    Years: 30.00    Pack years: 22.50    Quit date: 01/21/2007    Years since quitting:  13.2  . Smokeless tobacco: Never Used  Vaping Use  . Vaping Use: Never used  Substance and Sexual Activity  . Alcohol use: No  . Drug use: No  . Sexual activity: Yes    Birth control/protection: None  Other Topics Concern  . Not on file  Social History Narrative  . Not on file   Social Determinants of Health   Financial Resource Strain: Not on file  Food Insecurity: Not on file  Transportation  Needs: Not on file  Physical Activity: Not on file  Stress: Not on file  Social Connections: Not on file  Intimate Partner Violence: Not on file    Family History  Problem Relation Age of Onset  . Alzheimer's disease Mother   . CAD Father   . Alzheimer's disease Maternal Grandmother   . Thyroid disease Brother   . Hyperlipidemia Brother   . Hyperlipidemia Brother   . Alcohol abuse Brother   . Healthy Daughter   . Healthy Son   . Heart disease Paternal Uncle   . Heart disease Paternal Grandmother   . Heart disease Paternal Grandfather   . Thyroid disease Brother   . Cirrhosis Brother   . Healthy Son   . Breast cancer Cousin      Current Outpatient Medications:  .  acetaminophen (TYLENOL) 500 MG tablet, Take 1,000 mg by mouth every 6 (six) hours as needed., Disp: , Rfl:  .  Ascorbic Acid (VITAMIN C) 500 MG CAPS, Take 1 capsule by mouth daily., Disp: , Rfl:  .  buPROPion (WELLBUTRIN XL) 150 MG 24 hr tablet, Take 1 tablet by mouth once daily, Disp: 90 tablet, Rfl: 0 .  cholecalciferol (VITAMIN D) 1000 units tablet, Take 2,000 Units by mouth daily., Disp: , Rfl:  .  cyclobenzaprine (FLEXERIL) 10 MG tablet, Take 1 tablet (10 mg total) by mouth at bedtime as needed for muscle spasms., Disp: 30 tablet, Rfl: 1 .  Flaxseed, Linseed, (FLAX SEED OIL) 1000 MG CAPS, Take 1 capsule by mouth daily., Disp: , Rfl:  .  fluticasone (FLONASE) 50 MCG/ACT nasal spray, Place 1 spray into both nostrils daily as needed., Disp: , Rfl:  .  levothyroxine (SYNTHROID) 88 MCG tablet, TAKE 1 TABLET BY MOUTH ONCE DAILY BEFORE BREAKFAST, Disp: 90 tablet, Rfl: 0 .  loperamide (IMODIUM) 2 MG capsule, Take 2 mg by mouth as needed for diarrhea or loose stools., Disp: , Rfl:  .  naproxen (NAPROSYN) 500 MG tablet, Take 500 mg by mouth 3 (three) times daily as needed., Disp: , Rfl:  .  traMADol (ULTRAM) 50 MG tablet, TAKE 1 TABLET BY MOUTH BY MOUTH TWICE DAILY AS NEEDED FOR MODERATE PAIN (ABDOMINAL OR RECTAL PAIN),  Disp: 60 tablet, Rfl: 0 .  vitamin B-12 (CYANOCOBALAMIN) 1000 MCG tablet, Take 1,000 mcg by mouth daily., Disp: , Rfl:  .  nitroGLYCERIN (NITROSTAT) 0.4 MG SL tablet, Place 1 tablet (0.4 mg total) under the tongue every 5 (five) minutes as needed for chest pain. (Patient not taking: No sig reported), Disp: 100 tablet, Rfl: 3  CT Abdomen Pelvis W Contrast  Result Date: 04/29/2020 CLINICAL DATA:  History of anal cancer. Patient complains of left upper quadrant pain worsening over 6 months. EXAM: CT ABDOMEN AND PELVIS WITH CONTRAST TECHNIQUE: Multidetector CT imaging of the abdomen and pelvis was performed using the standard protocol following bolus administration of intravenous contrast. CONTRAST:  136mL OMNIPAQUE IOHEXOL 300 MG/ML  SOLN COMPARISON:  CT chest 4/23/8 FINDINGS: Lower chest: No acute abnormality.  Hepatobiliary: No focal liver abnormality is seen. No gallstones, gallbladder wall thickening, or biliary dilatation. Pancreas: Unremarkable. No pancreatic ductal dilatation or surrounding inflammatory changes. Spleen: Normal in size without focal abnormality. Adrenals/Urinary Tract: Adrenal glands are unremarkable. No kidney mass or hydronephrosis. Nonobstructing stone noted within inferior pole of left kidney measuring 3 mm. Bladder is unremarkable. Stomach/Bowel: Stomach appears normal. No bowel wall thickening, inflammation, or distension. The appendix is visualized and appears. Vascular/Lymphatic: No significant vascular findings are present. No enlarged abdominal or pelvic lymph nodes. Reproductive: Uterus and bilateral adnexa are unremarkable. Other: No abdominal wall hernia or abnormality. No abdominopelvic ascites. Musculoskeletal: No acute or significant osseous findings. IMPRESSION: 1. No acute findings within the abdomen or pelvis. No findings to suggest recurrent tumor or metastatic disease. 2. Nonobstructing left renal calculus. Electronically Signed   By: Kerby Moors M.D.   On:  04/29/2020 09:42   DG Foot Complete Left  Result Date: 04/11/2020 Please see detailed radiograph report in office note.   No images are attached to the encounter.   CMP Latest Ref Rng & Units 04/04/2020  Glucose 65 - 99 mg/dL 96  BUN 7 - 25 mg/dL 18  Creatinine 0.50 - 1.05 mg/dL 0.83  Sodium 135 - 146 mmol/L 142  Potassium 3.5 - 5.3 mmol/L 4.7  Chloride 98 - 110 mmol/L 107  CO2 20 - 32 mmol/L 23  Calcium 8.6 - 10.4 mg/dL 9.2  Total Protein 6.1 - 8.1 g/dL 6.4  Total Bilirubin 0.2 - 1.2 mg/dL 0.5  AST 10 - 35 U/L 20  ALT 6 - 29 U/L 14   CBC Latest Ref Rng & Units 04/15/2020  WBC 4.0 - 10.5 K/uL 3.8(L)  Hemoglobin 12.0 - 15.0 g/dL 13.1  Hematocrit 36.0 - 46.0 % 39.5  Platelets 150 - 400 K/uL 180     Observation/objective: Appears in no acute distress over video visit today.  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  Differential mainly shows lymphopenia.  Hemoglobin and platelet counts have been normal.  She does not require any follow-up for this at this time.  She was noted to have a low B12 level of 219 and I have asked her to take oral B12 1000 mcg daily.  She will need her B12 levels followed up with her primary care provider [facility].  Patient can be referred to [place] in the future if questions or concerns arise  Follow-up instructions: No follow-up needed  I discussed the assessment and treatment plan with the patient. The patient was provided an opportunity to ask questions and all were answered. The patient agreed with the plan and demonstrated an understanding of the instructions.   The patient was advised to call back or seek an in-person evaluation if the symptoms worsen or if the condition fails to improve as anticipated.   Visit Diagnosis: 1. Lymphopenia   2. B12 deficiency     Dr. Randa Evens, MD,  [facility] Tel- [phone] 05/06/2020 4:17 PM

## 2020-05-07 ENCOUNTER — Telehealth (INDEPENDENT_AMBULATORY_CARE_PROVIDER_SITE_OTHER): Payer: Medicare Other | Admitting: Family Medicine

## 2020-05-07 ENCOUNTER — Telehealth: Payer: Self-pay | Admitting: Adult Health

## 2020-05-07 ENCOUNTER — Encounter: Payer: Self-pay | Admitting: Family Medicine

## 2020-05-07 ENCOUNTER — Encounter: Payer: Self-pay | Admitting: Adult Health

## 2020-05-07 ENCOUNTER — Other Ambulatory Visit: Payer: Self-pay | Admitting: Adult Health

## 2020-05-07 ENCOUNTER — Ambulatory Visit: Payer: Self-pay

## 2020-05-07 DIAGNOSIS — U071 COVID-19: Secondary | ICD-10-CM

## 2020-05-07 MED ORDER — ALBUTEROL SULFATE HFA 108 (90 BASE) MCG/ACT IN AERS: 1.0000 | INHALATION_SPRAY | Freq: Four times a day (QID) | RESPIRATORY_TRACT | 0 refills | Status: DC | PRN

## 2020-05-07 MED ORDER — PREDNISONE 20 MG PO TABS: ORAL_TABLET | ORAL | 0 refills | Status: DC

## 2020-05-07 NOTE — Telephone Encounter (Signed)
Patient's husband called, on DPR, and says the patient did a home test on 05/04/20 and it was COVID positive. He says her symptoms were noticed on 05/04/20, but may have started sooner. He says she has a sore throat that is worse at night and it's so bad the patient says she doesn't think she can swallow pills. She has a cough, not that bad, nasal congestion, headache, loss of taste, loss of smell, body aches, chills, fever (body hot, no thermometer), and fatigue. He says she was a little SOB last night, but not today. MyChart video visit scheduled for today at 1540 with Dr. Althea Charon, care advice given, patient's husband verbalized understanding.  Reason for Disposition . [1] PERSISTING SYMPTOMS OF COVID-19 AND [2] symptoms WORSE  Answer Assessment - Initial Assessment Questions 1. COVID-19 ONSET: "When did the symptoms of COVID-19 first start?"     05/04/20 2. DIAGNOSIS CONFIRMATION: "How were you diagnosed?" (e.g., COVID-19 oral or nasal viral test; COVID-19 antibody test; doctor visit)     Nasal test at home on 05/04/20 3. MAIN SYMPTOM:  "What is your main concern or symptom right now?" (e.g., breathing difficulty, cough, fatigue. loss of smell)     Sore throat 4. SYMPTOM ONSET: "When did the sore throat start?"     05/04/20 5. BETTER-SAME-WORSE: "Are you getting better, staying the same, or getting worse over the last 1 to 2 weeks?"     Worse at night 6. RECENT MEDICAL VISIT: "Have you been seen by a healthcare provider (doctor, NP, PA) for these persisting COVID-19 symptoms?" If Yes, ask: "When were you seen?" (e.g., date)     No 7. COUGH: "Do you have a cough?" If Yes, ask: "How bad is the cough?"       Yes, not that bad 8. FEVER: "Do you have a fever?" If Yes, ask: "What is your temperature, how was it measured, and when did it start?"     Yes, not able to measure 9. BREATHING DIFFICULTY: "Are you having any trouble breathing?" If Yes, ask: "How bad is your breathing?" (e.g., mild, moderate,  severe)    - MILD: No SOB at rest, mild SOB with walking, speaks normally in sentences, can lie down, no retractions, pulse < 100.    - MODERATE: SOB at rest, SOB with minimal exertion and prefers to sit, cannot lie down flat, speaks in phrases, mild retractions, audible wheezing, pulse 100-120.    - SEVERE: Very SOB at rest, speaks in single words, struggling to breathe, sitting hunched forward, retractions, pulse > 120        A little last night 10. HIGH RISK DISEASE: "Do you have any chronic medical problems?" (e.g., asthma, heart or lung disease, weak immune system, obesity, etc.)       No 11. VACCINE: "Have you gotten the COVID-19 vaccine?" If Yes ask: "Which one, how many shots, when did you get it?"       Yes, 2 given  12. PREGNANCY: "Is there any chance you are pregnant?" "When was your last menstrual period?"      No 13. OTHER SYMPTOMS: "Do you have any other symptoms?"  (e.g., fatigue, headache, muscle pain, weakness)      Headache w/ congestion, loss off taste and smell, fatigue, weakness, body aches, chills  Protocols used: CORONAVIRUS (COVID-19) PERSISTING SYMPTOMS FOLLOW-UP CALL-A-AH

## 2020-05-07 NOTE — Patient Instructions (Addendum)
Referral sent to [facility] for further evaluation and they will contact you if eligible to proceed with any COVID specific treatment options.  For now start Prednisone and Albuterol inhaler.  Follow-up sooner if not improving.  Please schedule a Follow-up Appointment to: Return in about 1 week (around 05/14/2020), or if symptoms worsen or fail to improve, for COVID.  If you have any other questions or concerns, please feel free to call the office or send a message through MyChart. You may also schedule an earlier appointment if necessary.  Additionally, you may be receiving a survey about your experience at our office within a few days to 1 week by e-mail or mail. We value your feedback.  Saralyn Pilar, DO [facility], [place]

## 2020-05-07 NOTE — Progress Notes (Signed)
Subjective:    Patient ID: Brenda Grimes, female    DOB: December 04, 1965, 55 y.o.   MRN: [id number]  Brenda Grimes is a 55 y.o. female presenting on 05/07/2020 for Covid Positive (Cough, sore throat, fever, HA, chest congestion, SOB onset 4 days )  Virtual / Telehealth Encounter - Video Visit via MyChart The purpose of this virtual visit is to provide medical care while limiting exposure to the novel coronavirus (COVID19) for both patient and office staff.  Consent was obtained for remote visit:  Yes.   Answered questions that patient had about telehealth interaction:  Yes.   I discussed the limitations, risks, security and privacy concerns of performing an evaluation and management service by video/telephone. I also discussed with the patient that there may be a patient responsible charge related to this service. The patient expressed understanding and agreed to proceed.  Patient Location: Home Provider Location: [facility] (Office)  Participants in virtual visit: - Patient: Brenda Grimes - CMA: Elvina Mattes, CMA - Provider: Dr Althea Charon  PCP Danielle Rankin, FNP    HPI  COVID Infection Reports symptoms started 05/04/20 she felt sick with fever, cough sore throat, congestion, cough headache. She tested home test and it resulted POSITIVE for COVID 05/04/20. She says symptoms are worsening since past 3 days. She was vaccinated x 2 only unsure when last dose was. She does not have any history of asthma or COPD but has used rescue inhaler before, does not have a current albuterol inhaler. - She has history of anal cancer, s/p chemoradiation 2014, has lymphopenia low WBC followed by Oncology recently.  Admits fever chills, sore throat cough, dyspnea Denies chest pain nausea vomiting diarrhea  Health Maintenance: UTD COVID Vaccine x 2 doses, has not received booster.  Depression screen [facility] 2/9 11/09/2019 09/07/2019 07/11/2019  Decreased Interest 0 0 0  Down, Depressed, Hopeless 0 0  0  PHQ - 2 Score 0 0 0  Altered sleeping - 0 -  Tired, decreased energy - 0 -  Change in appetite - 0 -  Feeling bad or failure about yourself  - 0 -  Trouble concentrating - 1 -  Moving slowly or fidgety/restless - 0 -  Suicidal thoughts - 0 -  PHQ-9 Score - 1 -  Difficult doing work/chores - Not difficult at all -    Social History   Tobacco Use  . Smoking status: Former Smoker    Packs/day: 0.75    Years: 30.00    Pack years: 22.50    Quit date: 01/21/2007    Years since quitting: 13.3  . Smokeless tobacco: Never Used  Vaping Use  . Vaping Use: Never used  Substance Use Topics  . Alcohol use: No  . Drug use: No    Review of Systems Per HPI unless specifically indicated above     Objective:    There were no vitals taken for this visit.  Wt Readings from Last 3 Encounters:  04/15/20 177 lb 3.2 oz (80.4 kg)  04/04/20 177 lb 6.4 oz (80.5 kg)  11/09/19 168 lb 6.4 oz (76.4 kg)    Physical Exam   Note examination was completely remotely via video observation objective data only  Gen - mild ill appearing, appears fatigued, voice is hoarse, no acute distress or apparent pain HEENT - eyes appear clear without discharge or redness Heart/Lungs - cannot examine virtually - observed no evidence of coughing or labored breathing. Abd - cannot examine virtually  Skin - face  visible today- no rash Neuro - awake, alert, oriented Psych - not anxious appearing  Results for orders placed or performed in visit on 04/15/20  Technologist smear review  Result Value Ref Range   WBC Morphology UNREMARKABLE    RBC Morphology UNREMARKABLE    Tech Review PLATELETS APPEAR ADEQUATE   Folate  Result Value Ref Range   Folate 12.6 >5.9 ng/mL  Vitamin B12  Result Value Ref Range   Vitamin B-12 219 180 - 914 pg/mL  CBC with Differential/Platelet  Result Value Ref Range   WBC 3.8 (L) 4.0 - 10.5 K/uL   RBC 3.95 3.87 - 5.11 MIL/uL   Hemoglobin 13.1 12.0 - 15.0 g/dL   HCT 39.5 36.0 -  46.0 %   MCV 100.0 80.0 - 100.0 fL   MCH 33.2 26.0 - 34.0 pg   MCHC 33.2 30.0 - 36.0 g/dL   RDW 12.6 11.5 - 15.5 %   Platelets 180 150 - 400 K/uL   nRBC 0.0 0.0 - 0.2 %   Neutrophils Relative % 61 %   Neutro Abs 2.4 1.7 - 7.7 K/uL   Lymphocytes Relative 25 %   Lymphs Abs 1.0 0.7 - 4.0 K/uL   Monocytes Relative 9 %   Monocytes Absolute 0.3 0.1 - 1.0 K/uL   Eosinophils Relative 3 %   Eosinophils Absolute 0.1 0.0 - 0.5 K/uL   Basophils Relative 1 %   Basophils Absolute 0.0 0.0 - 0.1 K/uL   Immature Granulocytes 1 %   Abs Immature Granulocytes 0.02 0.00 - 0.07 K/uL      Assessment & Plan:   Problem List Items Addressed This Visit   None   Visit Diagnoses    COVID-19 virus infection    -  Primary   Relevant Medications   predniSONE (DELTASONE) 20 MG tablet   albuterol (VENTOLIN HFA) 108 (90 Base) MCG/ACT inhaler      Clinically with constellation of COVID symptoms with confirmed POSITIVE COVID home test 05/04/20 UTD COVID Vaccine Moderna last dose 11/09/19 now nearly eligible for booster dose 6 month later but has not received. 3 days some worsening symptoms with some concerning respiratory symptoms with dyspnea and sore throat difficulty with taking medication  No known co morbid pulmonary conditions She has history of risk factors for reduced immune system, history of cancer, anal cancer s/p chemoradiation 2014, and has lymphopenia  Start prednisone taper over 7 days Rx Albuterol rescue inhaler PRN shortness of breath  Referral sent via Epic secure chat to [place] [facility] and they will triage and schedule her accordingly if eligible for iv antibody infusion or other covid targeted treatment  Return criteria reviewed with patient.   Meds ordered this encounter  Medications  . predniSONE (DELTASONE) 20 MG tablet    Sig: Take daily with food. Start with 60mg  (3 pills) x 2 days, then reduce to 40mg  (2 pills) x 2 days, then 20mg  (1 pill) x 3 days    Dispense:  13  tablet    Refill:  0  . albuterol (VENTOLIN HFA) 108 (90 Base) MCG/ACT inhaler    Sig: Inhale 1-2 puffs into the lungs every 6 (six) hours as needed for wheezing or shortness of breath (cough).    Dispense:  1 each    Refill:  0      Follow up plan: Return in about 1 week (around 05/14/2020), or if symptoms worsen or fail to improve, for COVID.   [name], DO [facility] 05/07/2020, 4:03 PM

## 2020-05-07 NOTE — Progress Notes (Signed)
I connected by phone with Brenda BonitoValerie Grimes on 05/07/2020 at 5:02 PM to discuss the potential use of a new treatment for mild to moderate COVID-19 viral infection in non-hospitalized patients.  This patient is a 55 y.o. female that meets the FDA criteria for Emergency Use Authorization of COVID monoclonal antibody casirivimab/imdevimab, bamlanivimab/etesevimab, or sotrovimab.  Has a (+) direct SARS-CoV-2 viral test result  Has mild or moderate COVID-19   Is NOT hospitalized due to COVID-19  Is within 10 days of symptom onset  Has at least one of the high risk factor(s) for progression to severe COVID-19 and/or hospitalization as defined in EUA.  Specific high risk criteria : BMI > 25, Immunosuppressive Disease or Treatment and Cardiovascular disease or hypertension   I have spoken and communicated the following to the patient or parent/caregiver regarding COVID monoclonal antibody treatment:  1. FDA has authorized the emergency use for the treatment of mild to moderate COVID-19 in adults and pediatric patients with positive results of direct SARS-CoV-2 viral testing who are 57 years of age and older weighing at least 40 kg, and who are at high risk for progressing to severe COVID-19 and/or hospitalization.  2. The significant known and potential risks and benefits of COVID monoclonal antibody, and the extent to which such potential risks and benefits are unknown.  3. Information on available alternative treatments and the risks and benefits of those alternatives, including clinical trials.  4. Patients treated with COVID monoclonal antibody should continue to self-isolate and use infection control measures (e.g., wear mask, isolate, social distance, avoid sharing personal items, clean and disinfect "high touch" surfaces, and frequent handwashing) according to CDC guidelines.   5. The patient or parent/caregiver has the option to accept or refuse COVID monoclonal antibody treatment. 6. Cost  reviewed  After reviewing this information with the patient, the patient has agreed to receive one of the available covid 19 monoclonal antibodies and will be provided an appropriate fact sheet prior to infusion. Noreene FilbertLindsey C Naman Spychalski, NP 05/07/2020 5:02 PM    I connected by phone with Brenda BonitoValerie Saxton on 05/07/2020 at 5:02 PM to discuss the potential use of the antiviral REMDESIVIR for acute COVID-19 viral infection in non-hospitalized patients.   This patient is a 55 y.o. female that meets the FDA criteria for Emergency Use Authorization of REMDESIVIR.  Has a (+) direct SARS-CoV-2 viral test result  Has mild or moderate symptoms related to COVID-19  Is within 7 days of symptom onset  Has at least one of the high risk factor(s) for progression to severe COVID-19 and/or hospitalization as defined in NIH Guidelines and EUA.   Specific high risk criteria : BMI > 25, Immunosuppressive Disease or Treatment and Cardiovascular disease or hypertension   I have spoken and communicated the following to the patient or parent/caregiver regarding COVID-19 IV Antiviral treatment:  7. FDA has authorized the emergency use for the treatment of mild to moderate COVID-19 in adults and pediatric patients with positive results of SARS-CoV-2 testing who are 57 years of age and older weighing at least 40 kg, and who are at high risk for progressing to severe COVID-19 and/or hospitalization.  8. The significant known and potential risks and benefits in receiving REMDESIVIR in accordance with current NIH treatment guidelines.   a. The patient has demonstrated liver function tests that are in the acceptable range to administer therapy in the last 90 days   Lab Results  Component Value Date   ALT 14 04/04/2020   AST 20  04/04/2020   BILITOT 0.5 04/04/2020    9. Information on available alternative treatments and the risks and benefits of those alternatives, including clinical trials that may be accessible to the  patient.   10. Patients treated with antiviral therapy should continue to isolate and use infection control measures (e.g., wear mask, isolate, social distance, avoid sharing personal items, clean and disinfect "high touch" surfaces, and frequent handwashing) according to CDC guidelines.   11. The patient or parent/caregiver has the option to accept or refuse REMDESIVIR therapy and has had the opportunity to have all questions addressed prior to consenting for treatment.  12. Cost reviewed   After reviewing this information with the patient, he/she has decided to proceed with the 3 day course of treatment.   Noreene Filbert, NP 05/07/2020  5:02 PM

## 2020-05-07 NOTE — Telephone Encounter (Signed)
Called to discuss with patient about COVID-19 symptoms and the use of one of the available treatments for those with mild to moderate Covid symptoms and at a high risk of hospitalization.  Pt appears to qualify for outpatient treatment due to co-morbid conditions and/or a member of an at-risk group in accordance with the FDA Emergency Use Authorization.    Symptom onset: 1/1 Vaccinated: yes, but immunocompromised with lymphopenia Qualifiers: immunocompromise, BMI, CAD  Unable to reach pt - LMOM   Noreene Filbert

## 2020-05-08 ENCOUNTER — Ambulatory Visit (HOSPITAL_COMMUNITY)
Admission: RE | Admit: 2020-05-08 | Discharge: 2020-05-08 | Disposition: A | Discharge status: Home / Self Care | Payer: Medicare Other | Source: Ambulatory Visit | Attending: Pulmonary Disease | Admitting: Pulmonary Disease

## 2020-05-08 DIAGNOSIS — U071 COVID-19: Secondary | ICD-10-CM | POA: Diagnosis not present

## 2020-05-08 MED ORDER — SODIUM CHLORIDE 0.9 % IV SOLN: INTRAVENOUS | Status: DC | PRN

## 2020-05-08 MED ORDER — ALBUTEROL SULFATE HFA 108 (90 BASE) MCG/ACT IN AERS: 2.0000 | INHALATION_SPRAY | Freq: Once | RESPIRATORY_TRACT | Status: DC | PRN

## 2020-05-08 MED ORDER — SOTROVIMAB 500 MG/8ML IV SOLN
500.0000 mg | Freq: Once | INTRAVENOUS | Status: AC
  Administered 2020-05-08: 500 mg via INTRAVENOUS

## 2020-05-08 MED ORDER — DIPHENHYDRAMINE HCL 50 MG/ML IJ SOLN: 50.0000 mg | Freq: Once | INTRAMUSCULAR | Status: DC | PRN

## 2020-05-08 MED ORDER — SODIUM CHLORIDE 0.9 % IV SOLN
100.0000 mg | Freq: Once | INTRAVENOUS | Status: AC
  Administered 2020-05-08: 100 mg via INTRAVENOUS

## 2020-05-08 MED ORDER — FAMOTIDINE IN NACL 20-0.9 MG/50ML-% IV SOLN: 20.0000 mg | Freq: Once | INTRAVENOUS | Status: DC | PRN

## 2020-05-08 MED ORDER — METHYLPREDNISOLONE SODIUM SUCC 125 MG IJ SOLR: 125.0000 mg | Freq: Once | INTRAMUSCULAR | Status: DC | PRN

## 2020-05-08 MED ORDER — EPINEPHRINE 0.3 MG/0.3ML IJ SOAJ: 0.3000 mg | Freq: Once | INTRAMUSCULAR | Status: DC | PRN

## 2020-05-08 MED ORDER — FAMOTIDINE IN NACL 20-0.9 MG/50ML-% IV SOLN
20.0000 mg | Freq: Once | INTRAVENOUS | Status: DC | PRN
Start: 2020-05-08 — End: 2020-05-08

## 2020-05-08 MED ORDER — SODIUM CHLORIDE 0.9 % IV SOLN
100.0000 mg | Freq: Once | INTRAVENOUS | Status: AC
  Administered 2020-05-08: 100 mg via INTRAVENOUS
  Filled 2020-05-08: qty 20

## 2020-05-08 NOTE — Progress Notes (Signed)
Diagnosis: COVID-19  Physician: Dr. Shan Levans  Procedure: [facility] Med: Sotrovimab infusion - Provided patient with sotrovimab fact sheet for patients, parents, and caregivers prior to infusion.   Covid Infusion Clinic Med: remdesivir infusion - Provided patient with remdesivir fact sheet for patients, parents and caregivers prior to infusion.  Complications: No immediate complications noted  Discharge: Discharged home  Brenda Grimes 05/08/2020

## 2020-05-08 NOTE — Progress Notes (Signed)
Patient reviewed Fact Sheet for Patients, Parents, and Caregivers for Emergency Use Authorization (EUA) of Sotrovimab and Remdesivir for the Treatment of Coronavirus. Patient also reviewed and is agreeable to the estimated cost of treatment. Patient is agreeable to proceed.

## 2020-05-08 NOTE — Discharge Instructions (Signed)
10 Things You Can Do to Manage Your COVID-19 Symptoms at Home If you have possible or confirmed COVID-19: 1. Stay home from work and school. And stay away from other public places. If you must go out, avoid using any kind of public transportation, ridesharing, or taxis. 2. Monitor your symptoms carefully. If your symptoms get worse, call your healthcare provider immediately. 3. Get rest and stay hydrated. 4. If you have a medical appointment, call the healthcare provider ahead of time and tell them that you have or may have COVID-19. 5. For medical emergencies, call 911 and notify the dispatch personnel that you have or may have COVID-19. 6. Cover your cough and sneezes with a tissue or use the inside of your elbow. 7. Wash your hands often with soap and water for at least 20 seconds or clean your hands with an alcohol-based hand sanitizer that contains at least 60% alcohol. 8. As much as possible, stay in a specific room and away from other people in your home. Also, you should use a separate bathroom, if available. If you need to be around other people in or outside of the home, wear a mask. 9. Avoid sharing personal items with other people in your household, like dishes, towels, and bedding. 10. Clean all surfaces that are touched often, like counters, tabletops, and doorknobs. Use household cleaning sprays or wipes according to the label instructions. cdc.gov/coronavirus 11/02/2018 This information is not intended to replace advice given to you by your health care provider. Make sure you discuss any questions you have with your health care provider. Document Revised: 04/06/2019 Document Reviewed: 04/06/2019 Elsevier Patient Education  2020 Elsevier Inc. What types of side effects do monoclonal antibody drugs cause?  Common side effects  In general, the more common side effects caused by monoclonal antibody drugs include: . Allergic reactions, such as hives or itching . Flu-like signs and  symptoms, including chills, fatigue, fever, and muscle aches and pains . Nausea, vomiting . Diarrhea . Skin rashes . Low blood pressure   The CDC is recommending patients who receive monoclonal antibody treatments wait at least 90 days before being vaccinated.  Currently, there are no data on the safety and efficacy of mRNA COVID-19 vaccines in persons who received monoclonal antibodies or convalescent plasma as part of COVID-19 treatment. Based on the estimated half-life of such therapies as well as evidence suggesting that reinfection is uncommon in the 90 days after initial infection, vaccination should be deferred for at least 90 days, as a precautionary measure until additional information becomes available, to avoid interference of the antibody treatment with vaccine-induced immune responses. What types of side effects do monoclonal antibody drugs cause?  Common side effects  In general, the more common side effects caused by monoclonal antibody drugs include: . Allergic reactions, such as hives or itching . Flu-like signs and symptoms, including chills, fatigue, fever, and muscle aches and pains . Nausea, vomiting . Diarrhea . Skin rashes . Low blood pressure   The CDC is recommending patients who receive monoclonal antibody treatments wait at least 90 days before being vaccinated.  Currently, there are no data on the safety and efficacy of mRNA COVID-19 vaccines in persons who received monoclonal antibodies or convalescent plasma as part of COVID-19 treatment. Based on the estimated half-life of such therapies as well as evidence suggesting that reinfection is uncommon in the 90 days after initial infection, vaccination should be deferred for at least 90 days, as a precautionary measure until additional information   becomes available, to avoid interference of the antibody treatment with vaccine-induced immune responses. If you have any questions or concerns after the infusion please  call the Advanced Practice Provider on call at [phone]. This number is ONLY intended for your use regarding questions or concerns about the infusion post-treatment side-effects.  Please do not provide this number to others for use. For return to work notes please contact your primary care provider.   If someone you know is interested in receiving treatment please have them call the COVID hotline at [phone].   

## 2020-05-09 ENCOUNTER — Other Ambulatory Visit (HOSPITAL_COMMUNITY): Payer: Self-pay

## 2020-05-09 ENCOUNTER — Ambulatory Visit (HOSPITAL_COMMUNITY)
Admission: RE | Admit: 2020-05-09 | Discharge: 2020-05-09 | Disposition: A | Discharge status: Home / Self Care | Payer: Medicare Other | Source: Ambulatory Visit | Attending: Pulmonary Disease | Admitting: Pulmonary Disease

## 2020-05-09 ENCOUNTER — Ambulatory Visit: Payer: Medicare Other | Admitting: Podiatry

## 2020-05-09 DIAGNOSIS — U071 COVID-19: Secondary | ICD-10-CM | POA: Diagnosis not present

## 2020-05-09 DIAGNOSIS — J1282 Pneumonia due to coronavirus disease 2019: Secondary | ICD-10-CM | POA: Diagnosis not present

## 2020-05-09 MED ORDER — SODIUM CHLORIDE 0.9 % IV SOLN
100.0000 mg | Freq: Once | INTRAVENOUS | Status: AC
  Administered 2020-05-09: 100 mg via INTRAVENOUS

## 2020-05-09 MED ORDER — METHYLPREDNISOLONE SODIUM SUCC 125 MG IJ SOLR: 125.0000 mg | Freq: Once | INTRAMUSCULAR | Status: DC | PRN

## 2020-05-09 MED ORDER — SODIUM CHLORIDE 0.9 % IV SOLN: INTRAVENOUS | Status: DC | PRN

## 2020-05-09 MED ORDER — ALBUTEROL SULFATE HFA 108 (90 BASE) MCG/ACT IN AERS: 2.0000 | INHALATION_SPRAY | Freq: Once | RESPIRATORY_TRACT | Status: DC | PRN

## 2020-05-09 MED ORDER — EPINEPHRINE 0.3 MG/0.3ML IJ SOAJ: 0.3000 mg | Freq: Once | INTRAMUSCULAR | Status: DC | PRN

## 2020-05-09 MED ORDER — DIPHENHYDRAMINE HCL 50 MG/ML IJ SOLN: 50.0000 mg | Freq: Once | INTRAMUSCULAR | Status: DC | PRN

## 2020-05-09 MED ORDER — FAMOTIDINE IN NACL 20-0.9 MG/50ML-% IV SOLN: 20.0000 mg | Freq: Once | INTRAVENOUS | Status: DC | PRN

## 2020-05-09 NOTE — Progress Notes (Addendum)
Patient reviewed Fact Sheet for Patients, Parents, and Caregivers for Emergency Use Authorization (EUA) of remdesivir for the Treatment of Coronavirus. Patient also reviewed and is agreeable to the estimated cost of treatment. Patient is agreeable to proceed.    

## 2020-05-09 NOTE — Discharge Instructions (Signed)
10 Things You Can Do to Manage Your COVID-19 Symptoms at Home If you have possible or confirmed COVID-19: 1. Stay home from work and school. And stay away from other public places. If you must go out, avoid using any kind of public transportation, ridesharing, or taxis. 2. Monitor your symptoms carefully. If your symptoms get worse, call your healthcare provider immediately. 3. Get rest and stay hydrated. 4. If you have a medical appointment, call the healthcare provider ahead of time and tell them that you have or may have COVID-19. 5. For medical emergencies, call 911 and notify the dispatch personnel that you have or may have COVID-19. 6. Cover your cough and sneezes with a tissue or use the inside of your elbow. 7. Wash your hands often with soap and water for at least 20 seconds or clean your hands with an alcohol-based hand sanitizer that contains at least 60% alcohol. 8. As much as possible, stay in a specific room and away from other people in your home. Also, you should use a separate bathroom, if available. If you need to be around other people in or outside of the home, wear a mask. 9. Avoid sharing personal items with other people in your household, like dishes, towels, and bedding. 10. Clean all surfaces that are touched often, like counters, tabletops, and doorknobs. Use household cleaning sprays or wipes according to the label instructions. cdc.gov/coronavirus 11/02/2018 This information is not intended to replace advice given to you by your health care provider. Make sure you discuss any questions you have with your health care provider. Document Revised: 04/06/2019 Document Reviewed: 04/06/2019 Elsevier Patient Education  2020 Elsevier Inc.  If you have any questions or concerns after the infusion please call the Advanced Practice Provider on call at [phone]. This number is ONLY intended for your use regarding questions or concerns about the infusion post-treatment  side-effects.  Please do not provide this number to others for use. For return to work notes please contact your primary care provider.   If someone you know is interested in receiving treatment please have them call the COVID hotline at [phone].    

## 2020-05-09 NOTE — Progress Notes (Signed)
  Diagnosis: COVID-19  Physician: Dr. Shan Levans  Procedure: [facility]fusion Clinic Med: remdesivir infusion - Provided patient with remdesivir fact sheet for patients, parents and caregivers prior to infusion.  Complications: No immediate complications noted.  Discharge: Discharged home   Essie Hart 05/09/2020

## 2020-05-10 ENCOUNTER — Encounter: Payer: Self-pay | Admitting: Family Medicine

## 2020-05-10 ENCOUNTER — Ambulatory Visit (HOSPITAL_COMMUNITY)
Admission: RE | Admit: 2020-05-10 | Discharge: 2020-05-10 | Disposition: A | Discharge status: Home / Self Care | Payer: Medicare Other | Source: Ambulatory Visit | Attending: Pulmonary Disease | Admitting: Pulmonary Disease

## 2020-05-10 DIAGNOSIS — J1282 Pneumonia due to coronavirus disease 2019: Secondary | ICD-10-CM | POA: Diagnosis not present

## 2020-05-10 DIAGNOSIS — U071 COVID-19: Secondary | ICD-10-CM

## 2020-05-10 DIAGNOSIS — E039 Hypothyroidism, unspecified: Secondary | ICD-10-CM

## 2020-05-10 MED ORDER — SODIUM CHLORIDE 0.9 % IV SOLN: INTRAVENOUS | Status: DC | PRN

## 2020-05-10 MED ORDER — FAMOTIDINE IN NACL 20-0.9 MG/50ML-% IV SOLN: 20.0000 mg | Freq: Once | INTRAVENOUS | Status: DC | PRN

## 2020-05-10 MED ORDER — EPINEPHRINE 0.3 MG/0.3ML IJ SOAJ: 0.3000 mg | Freq: Once | INTRAMUSCULAR | Status: DC | PRN

## 2020-05-10 MED ORDER — LEVOTHYROXINE SODIUM 88 MCG PO TABS
ORAL_TABLET | ORAL | 0 refills | Status: DC
Start: 2020-05-10 — End: 2020-05-21

## 2020-05-10 MED ORDER — METHYLPREDNISOLONE SODIUM SUCC 125 MG IJ SOLR: 125.0000 mg | Freq: Once | INTRAMUSCULAR | Status: DC | PRN

## 2020-05-10 MED ORDER — SODIUM CHLORIDE 0.9 % IV SOLN
100.0000 mg | Freq: Once | INTRAVENOUS | Status: AC
  Administered 2020-05-10: 100 mg via INTRAVENOUS

## 2020-05-10 MED ORDER — ALBUTEROL SULFATE HFA 108 (90 BASE) MCG/ACT IN AERS: 2.0000 | INHALATION_SPRAY | Freq: Once | RESPIRATORY_TRACT | Status: DC | PRN

## 2020-05-10 MED ORDER — DIPHENHYDRAMINE HCL 50 MG/ML IJ SOLN: 50.0000 mg | Freq: Once | INTRAMUSCULAR | Status: DC | PRN

## 2020-05-10 NOTE — Progress Notes (Signed)
  Diagnosis: COVID-19  Physician: Dr. Asencion Noble   Procedure: [facility] Infusion Clinic Med: remdesivir infusion - Provided patient with remdesivir fact sheet for patients, parents and caregivers prior to infusion.  Complications: No immediate complications noted.  Discharge: Discharged home   Monna Fam 05/10/2020

## 2020-05-10 NOTE — Discharge Instructions (Signed)
10 Things You Can Do to Manage Your COVID-19 Symptoms at Home If you have possible or confirmed COVID-19: 1. Stay home from work and school. And stay away from other public places. If you must go out, avoid using any kind of public transportation, ridesharing, or taxis. 2. Monitor your symptoms carefully. If your symptoms get worse, call your healthcare provider immediately. 3. Get rest and stay hydrated. 4. If you have a medical appointment, call the healthcare provider ahead of time and tell them that you have or may have COVID-19. 5. For medical emergencies, call 911 and notify the dispatch personnel that you have or may have COVID-19. 6. Cover your cough and sneezes with a tissue or use the inside of your elbow. 7. Wash your hands often with soap and water for at least 20 seconds or clean your hands with an alcohol-based hand sanitizer that contains at least 60% alcohol. 8. As much as possible, stay in a specific room and away from other people in your home. Also, you should use a separate bathroom, if available. If you need to be around other people in or outside of the home, wear a mask. 9. Avoid sharing personal items with other people in your household, like dishes, towels, and bedding. 10. Clean all surfaces that are touched often, like counters, tabletops, and doorknobs. Use household cleaning sprays or wipes according to the label instructions. cdc.gov/coronavirus 11/02/2018 This information is not intended to replace advice given to you by your health care provider. Make sure you discuss any questions you have with your health care provider. Document Revised: 04/06/2019 Document Reviewed: 04/06/2019 Elsevier Patient Education  2020 Elsevier Inc.  If you have any questions or concerns after the infusion please call the Advanced Practice Provider on call at [phone]. This number is ONLY intended for your use regarding questions or concerns about the infusion post-treatment  side-effects.  Please do not provide this number to others for use. For return to work notes please contact your primary care provider.   If someone you know is interested in receiving treatment please have them call the COVID hotline at [phone].    

## 2020-05-13 NOTE — Telephone Encounter (Signed)
Refill sent to pharmacy on Friday

## 2020-05-15 ENCOUNTER — Encounter: Payer: Self-pay | Admitting: Family Medicine

## 2020-05-16 ENCOUNTER — Telehealth: Payer: Self-pay

## 2020-05-16 NOTE — Telephone Encounter (Signed)
Called the patient to offer an appt. She informed me that she is currently waiting to hear back from them concerning her short term disability before scheduling.

## 2020-05-19 ENCOUNTER — Encounter: Payer: Self-pay | Admitting: Family Medicine

## 2020-05-21 ENCOUNTER — Other Ambulatory Visit: Payer: Self-pay | Admitting: Family Medicine

## 2020-05-21 DIAGNOSIS — E039 Hypothyroidism, unspecified: Secondary | ICD-10-CM

## 2020-05-21 NOTE — Telephone Encounter (Signed)
Refill resent to [facility] in [place]. Previously sent to [facility] in [place].

## 2020-06-12 NOTE — Addendum Note (Signed)
Encounter addended by: Marabella Popiel A, RN on: 06/12/2020 6:30 PM  Actions taken: Charge Capture section accepted

## 2020-06-14 ENCOUNTER — Other Ambulatory Visit: Payer: Self-pay

## 2020-06-14 DIAGNOSIS — G894 Chronic pain syndrome: Secondary | ICD-10-CM

## 2020-06-14 DIAGNOSIS — F339 Major depressive disorder, recurrent, unspecified: Secondary | ICD-10-CM

## 2020-06-14 MED ORDER — BUPROPION HCL ER (XL) 150 MG PO TB24: 150.0000 mg | ORAL_TABLET | Freq: Every day | ORAL | 0 refills | Status: DC

## 2020-06-14 MED ORDER — TRAMADOL HCL 50 MG PO TABS: ORAL_TABLET | ORAL | 0 refills | Status: DC

## 2020-07-16 ENCOUNTER — Ambulatory Visit (INDEPENDENT_AMBULATORY_CARE_PROVIDER_SITE_OTHER): Payer: BC Managed Care – PPO

## 2020-07-16 VITALS — Ht 64.0 in | Wt 178.0 lb

## 2020-07-16 DIAGNOSIS — Z0000 Encounter for general adult medical examination without abnormal findings: Secondary | ICD-10-CM | POA: Diagnosis not present

## 2020-07-16 NOTE — Progress Notes (Signed)
I connected with Brenda Grimes today by telephone and verified that I am speaking with the correct person using two identifiers. Location patient: home Location provider: work Persons participating in the virtual visit: Brenda Grimes, Glenna Durand LPN.   I discussed the limitations, risks, security and privacy concerns of performing an evaluation and management service by telephone and the availability of in person appointments. I also discussed with the patient that there may be a patient responsible charge related to this service. The patient expressed understanding and verbally consented to this telephonic visit.    Interactive audio and video telecommunications were attempted between this provider and patient, however failed, due to patient having technical difficulties OR patient did not have access to video capability.  We continued and completed visit with audio only.     Vital signs may be patient reported or missing.  Subjective:   Brenda Grimes is a 55 y.o. female who presents for Medicare Annual (Subsequent) preventive examination.  Review of Systems     Cardiac Risk Factors include: sedentary lifestyle;obesity (BMI >30kg/m2)     Objective:    Today's Vitals   07/16/20 0951 07/16/20 0952  Weight: 178 lb (80.7 kg)   Height: 5\' 4"  (1.626 m)   PainSc:  8    Body mass index is 30.55 kg/m.  Advanced Directives 07/16/2020 05/02/2020 04/15/2020 07/11/2019 02/14/2018 03/23/2017  Does Patient Have a Medical Advance Directive? No No No No No No  Would patient like information on creating a medical advance directive? - No - Patient declined No - Patient declined - - -    Current Medications (verified) Outpatient Encounter Medications as of 07/16/2020  Medication Sig  . acetaminophen (TYLENOL) 500 MG tablet Take 1,000 mg by mouth every 6 (six) hours as needed.  Marland Kitchen albuterol (VENTOLIN HFA) 108 (90 Base) MCG/ACT inhaler Inhale 1-2 puffs into the lungs every 6 (six) hours as needed  for wheezing or shortness of breath (cough).  . Ascorbic Acid (VITAMIN C) 500 MG CAPS Take 1 capsule by mouth daily.  Marland Kitchen buPROPion (WELLBUTRIN XL) 150 MG 24 hr tablet Take 1 tablet (150 mg total) by mouth daily.  . cholecalciferol (VITAMIN D) 1000 units tablet Take 2,000 Units by mouth daily.  . cyclobenzaprine (FLEXERIL) 10 MG tablet Take 1 tablet (10 mg total) by mouth at bedtime as needed for muscle spasms.  . fluticasone (FLONASE) 50 MCG/ACT nasal spray Place 1 spray into both nostrils daily as needed.  Marland Kitchen levothyroxine (SYNTHROID) 88 MCG tablet TAKE 1 TABLET BY MOUTH ONCE DAILY BEFORE BREAKFAST  . loperamide (IMODIUM) 2 MG capsule Take 2 mg by mouth as needed for diarrhea or loose stools.  . naproxen (NAPROSYN) 500 MG tablet Take 500 mg by mouth 3 (three) times daily as needed.  . traMADol (ULTRAM) 50 MG tablet TAKE 1 TABLET BY MOUTH BY MOUTH TWICE DAILY AS NEEDED FOR MODERATE PAIN (ABDOMINAL OR RECTAL PAIN)  . vitamin B-12 (CYANOCOBALAMIN) 1000 MCG tablet Take 1,000 mcg by mouth daily.  . Flaxseed, Linseed, (FLAX SEED OIL) 1000 MG CAPS Take 1 capsule by mouth daily. (Patient not taking: Reported on 07/16/2020)  . nitroGLYCERIN (NITROSTAT) 0.4 MG SL tablet Place 1 tablet (0.4 mg total) under the tongue every 5 (five) minutes as needed for chest pain. (Patient not taking: No sig reported)  . predniSONE (DELTASONE) 20 MG tablet Take daily with food. Start with 60mg  (3 pills) x 2 days, then reduce to 40mg  (2 pills) x 2 days, then 20mg  (1 pill) x  3 days (Patient not taking: Reported on 07/16/2020)   No facility-administered encounter medications on file as of 07/16/2020.    Allergies (verified) Sulfa antibiotics, Sulfasalazine, and Tape   History: Past Medical History:  Diagnosis Date  . Arthritis   . CAD (coronary artery disease)    s/p stenting x 2  . Cancer ([place])    anus cancer  . Colon polyp   . Depression   . Hyperlipidemia   . MI (myocardial infarction) ([place])   . Thyroid disease    . Vitamin D deficiency    Past Surgical History:  Procedure Laterality Date  . CARDIAC SURGERY    . CORONARY ANGIOPLASTY WITH STENT PLACEMENT    . TUBAL LIGATION     Family History  Problem Relation Age of Onset  . Alzheimer's disease Mother   . CAD Father   . Alzheimer's disease Maternal Grandmother   . Thyroid disease Brother   . Hyperlipidemia Brother   . Hyperlipidemia Brother   . Alcohol abuse Brother   . Healthy Daughter   . Healthy Son   . Heart disease Paternal Uncle   . Heart disease Paternal Grandmother   . Heart disease Paternal Grandfather   . Thyroid disease Brother   . Cirrhosis Brother   . Healthy Son   . Breast cancer Cousin    Social History   Socioeconomic History  . Marital status: Married    Spouse name: Not on file  . Number of children: Not on file  . Years of education: Not on file  . Highest education level: Not on file  Occupational History  . Not on file  Tobacco Use  . Smoking status: Former Smoker    Packs/day: 0.75    Years: 30.00    Pack years: 22.50    Quit date: 01/21/2007    Years since quitting: 13.4  . Smokeless tobacco: Never Used  Vaping Use  . Vaping Use: Never used  Substance and Sexual Activity  . Alcohol use: No  . Drug use: No  . Sexual activity: Yes    Birth control/protection: None  Other Topics Concern  . Not on file  Social History Narrative  . Not on file   Social Determinants of Health   Financial Resource Strain: Low Risk   . Difficulty of Paying Living Expenses: Not hard at all  Food Insecurity: No Food Insecurity  . Worried About Charity fundraiser in the Last Year: Never true  . Ran Out of Food in the Last Year: Never true  Transportation Needs: No Transportation Needs  . Lack of Transportation (Medical): No  . Lack of Transportation (Non-Medical): No  Physical Activity: Inactive  . Days of Exercise per Week: 0 days  . Minutes of Exercise per Session: 0 min  Stress: No Stress Concern Present   . Feeling of Stress : Not at all  Social Connections: Not on file    Tobacco Counseling Counseling given: Not Answered   Clinical Intake:  Pre-visit preparation completed: Yes  Pain : 0-10 Pain Score: 8  Pain Type: Chronic pain Pain Location: Foot Pain Orientation: Left Pain Descriptors / Indicators: Sharp Pain Onset: More than a month ago Pain Frequency: Constant     Nutritional Status: BMI > 30  Obese Nutritional Risks: None  How often do you need to have someone help you when you read instructions, pamphlets, or other written materials from your doctor or pharmacy?: 1 - Never What is the last grade level you  completed in school?: some college  Diabetic? no  Interpreter Needed?: No  Information entered by :: NAllen LPN   Activities of Daily Living In your present state of health, do you have any difficulty performing the following activities: 07/16/2020  Hearing? Y  Vision? Y  Comment going to make an eye appointment  Difficulty concentrating or making decisions? N  Walking or climbing stairs? N  Dressing or bathing? N  Doing errands, shopping? N  Preparing Food and eating ? N  Using the Toilet? N  In the past six months, have you accidently leaked urine? Y  Do you have problems with loss of bowel control? Y  Managing your Medications? N  Managing your Finances? N  Housekeeping or managing your Housekeeping? N  Some recent data might be hidden    Patient Care Team: Malfi, Lupita Raider, FNP as PCP - General (Family Medicine)  Indicate any recent Medical Services you may have received from other than [facility] providers in the past year (date may be approximate).     Assessment:   This is a routine wellness examination for Kayna.  Hearing/Vision screen  Hearing Screening   125Hz  250Hz  500Hz  1000Hz  2000Hz  3000Hz  4000Hz  6000Hz  8000Hz   Right ear:           Left ear:           Vision Screening Comments: No regular eye exams, [facility]  Dietary issues and  exercise activities discussed: Current Exercise Habits: The patient does not participate in regular exercise at present  Goals    . Patient Stated     07/16/2020, wants foot to get better      Depression Screen PHQ 2/9 Scores 07/16/2020 11/09/2019 09/07/2019 07/11/2019 01/27/2019 12/19/2018 06/30/2018  PHQ - 2 Score 6 0 0 0 6 0 0  PHQ- 9 Score 18 - 1 - 8 0 -    Fall Risk Fall Risk  07/16/2020 07/11/2019 06/30/2018 06/02/2018  Falls in the past year? 1 1 0 0  Comment lost balance - - -  Number falls in past yr: 0 0 - 0  Injury with Fall? - 0 - -  Risk for fall due to : Impaired mobility;Medication side effect - - -  Follow up Falls evaluation completed;Education provided;Falls prevention discussed - - Falls evaluation completed    FALL RISK PREVENTION PERTAINING TO THE HOME:  Any stairs in or around the home? No  If so, are there any without handrails? n/a Home free of loose throw rugs in walkways, pet beds, electrical cords, etc? Yes  Adequate lighting in your home to reduce risk of falls? Yes   ASSISTIVE DEVICES UTILIZED TO PREVENT FALLS:  Life alert? No  Use of a cane, walker or w/c? Yes  Grab bars in the bathroom? Yes  Shower chair or bench in shower? Yes  Elevated toilet seat or a handicapped toilet? No   TIMED UP AND GO:  Was the test performed? No .    Cognitive Function:     6CIT Screen 07/16/2020  What Year? 0 points  What month? 0 points  What time? 0 points  Count back from 20 0 points  Months in reverse 2 points  Repeat phrase 6 points  Total Score 8    Immunizations Immunization History  Administered Date(s) Administered  . Influenza, Seasonal, Injecte, Preservative Fre 03/28/2013  . Influenza,inj,Quad PF,6+ Mos 01/15/2014, 01/31/2015, 04/02/2016, 01/21/2017, 01/31/2018, 01/27/2019, 04/04/2020  . Influenza-Unspecified 01/15/2014, 01/31/2015, 04/02/2016, 01/21/2017  . Moderna Sars-Covid-2 Vaccination 10/10/2019, 11/09/2019  TDAP status: Due, Education has  been provided regarding the importance of this vaccine. Advised may receive this vaccine at local pharmacy or Health Dept. Aware to provide a copy of the vaccination record if obtained from local pharmacy or Health Dept. Verbalized acceptance and understanding.  Flu Vaccine status: Up to date  Pneumococcal vaccine status: Up to date  Covid-19 vaccine status: Completed vaccines  Qualifies for Shingles Vaccine? Yes   Zostavax completed No   Shingrix Completed?: No.    Education has been provided regarding the importance of this vaccine. Patient has been advised to call insurance company to determine out of pocket expense if they have not yet received this vaccine. Advised may also receive vaccine at local pharmacy or Health Dept. Verbalized acceptance and understanding.  Screening Tests Health Maintenance  Topic Date Due  . Hepatitis C Screening  Never done  . HIV Screening  Never done  . TETANUS/TDAP  Never done  . COVID-19 Vaccine (3 - Moderna risk 4-dose series) 12/07/2019  . MAMMOGRAM  02/08/2021  . COLONOSCOPY (Pts 45-13yrs Insurance coverage will need to be confirmed)  03/31/2027  . INFLUENZA VACCINE  Completed  . HPV VACCINES  Aged Out  . PAP SMEAR-Modifier  Discontinued    Health Maintenance  Health Maintenance Due  Topic Date Due  . Hepatitis C Screening  Never done  . HIV Screening  Never done  . TETANUS/TDAP  Never done  . COVID-19 Vaccine (3 - Moderna risk 4-dose series) 12/07/2019    Colorectal cancer screening: Type of screening: Colonoscopy. Completed 03/30/2017. Repeat every 5 years  Mammogram status: patient to schedule  Bone Density status: n/a  Lung Cancer Screening: (Low Dose CT Chest recommended if Age 67-80 years, 30 pack-year currently smoking OR have quit w/in 15years.) does not qualify.   Lung Cancer Screening Referral: no  Additional Screening:  Hepatitis C Screening: does qualify; due  Vision Screening: Recommended annual ophthalmology exams  for early detection of glaucoma and other disorders of the eye. Is the patient up to date with their annual eye exam?  No  Who is the provider or what is the name of the office in which the patient attends annual eye exams? [facility] If pt is not established with a provider, would they like to be referred to a provider to establish care? No .   Dental Screening: Recommended annual dental exams for proper oral hygiene  Community Resource Referral / Chronic Care Management: CRR required this visit?  No   CCM required this visit?  No      Plan:     I have personally reviewed and noted the following in the patient's chart:   . Medical and social history . Use of alcohol, tobacco or illicit drugs  . Current medications and supplements . Functional ability and status . Nutritional status . Physical activity . Advanced directives . List of other physicians . Hospitalizations, surgeries, and ER visits in previous 12 months . Vitals . Screenings to include cognitive, depression, and falls . Referrals and appointments  In addition, I have reviewed and discussed with patient certain preventive protocols, quality metrics, and best practice recommendations. A written personalized care plan for preventive services as well as general preventive health recommendations were provided to patient.     Kellie Simmering, LPN   07/17/4006   Nurse Notes:

## 2020-07-16 NOTE — Patient Instructions (Signed)
[name] , Thank you for taking time to come for your Medicare Wellness Visit. I appreciate your ongoing commitment to your health goals. Please review the following plan we discussed and let me know if I can assist you in the future.   Screening recommendations/referrals: Colonoscopy: completed 03/30/2017 Mammogram: patient to schedule Bone Density: n/a Recommended yearly ophthalmology/optometry visit for glaucoma screening and checkup Recommended yearly dental visit for hygiene and checkup  Vaccinations: Influenza vaccine: completed 04/04/2020, due 12/02/2020 Pneumococcal vaccine: n/a Tdap vaccine: due Shingles vaccine: discussed  Covid-19:  11/09/2019, 10/10/2019  Advanced directives: Advance directive discussed with you today.   Conditions/risks identified: none  Next appointment: Follow up in one year for your annual wellness visit.   Preventive Care 40-64 Years, Female Preventive care refers to lifestyle choices and visits with your health care provider that can promote health and wellness. What does preventive care include?  A yearly physical exam. This is also called an annual well check.  Dental exams once or twice a year.  Routine eye exams. Ask your health care provider how often you should have your eyes checked.  Personal lifestyle choices, including:  Daily care of your teeth and gums.  Regular physical activity.  Eating a healthy diet.  Avoiding tobacco and drug use.  Limiting alcohol use.  Practicing safe sex.  Taking low-dose aspirin daily starting at age 24.  Taking vitamin and mineral supplements as recommended by your health care provider. What happens during an annual well check? The services and screenings done by your health care provider during your annual well check will depend on your age, overall health, lifestyle risk factors, and family history of disease. Counseling  Your health care provider may ask you questions about your:  Alcohol  use.  Tobacco use.  Drug use.  Emotional well-being.  Home and relationship well-being.  Sexual activity.  Eating habits.  Work and work Statistician.  Method of birth control.  Menstrual cycle.  Pregnancy history. Screening  You may have the following tests or measurements:  Height, weight, and BMI.  Blood pressure.  Lipid and cholesterol levels. These may be checked every 5 years, or more frequently if you are over 89 years old.  Skin check.  Lung cancer screening. You may have this screening every year starting at age 59 if you have a 30-pack-year history of smoking and currently smoke or have quit within the past 15 years.  Fecal occult blood test (FOBT) of the stool. You may have this test every year starting at age 16.  Flexible sigmoidoscopy or colonoscopy. You may have a sigmoidoscopy every 5 years or a colonoscopy every 10 years starting at age 55.  Hepatitis C blood test.  Hepatitis B blood test.  Sexually transmitted disease (STD) testing.  Diabetes screening. This is done by checking your blood sugar (glucose) after you have not eaten for a while (fasting). You may have this done every 1-3 years.  Mammogram. This may be done every 1-2 years. Talk to your health care provider about when you should start having regular mammograms. This may depend on whether you have a family history of breast cancer.  BRCA-related cancer screening. This may be done if you have a family history of breast, ovarian, tubal, or peritoneal cancers.  Pelvic exam and Pap test. This may be done every 3 years starting at age 80. Starting at age 3, this may be done every 5 years if you have a Pap test in combination with an HPV  test.  Bone density scan. This is done to screen for osteoporosis. You may have this scan if you are at high risk for osteoporosis. Discuss your test results, treatment options, and if necessary, the need for more tests with your health care  provider. Vaccines  Your health care provider may recommend certain vaccines, such as:  Influenza vaccine. This is recommended every year.  Tetanus, diphtheria, and acellular pertussis (Tdap, Td) vaccine. You may need a Td booster every 10 years.  Zoster vaccine. You may need this after age 85.  Pneumococcal 13-valent conjugate (PCV13) vaccine. You may need this if you have certain conditions and were not previously vaccinated.  Pneumococcal polysaccharide (PPSV23) vaccine. You may need one or two doses if you smoke cigarettes or if you have certain conditions. Talk to your health care provider about which screenings and vaccines you need and how often you need them. This information is not intended to replace advice given to you by your health care provider. Make sure you discuss any questions you have with your health care provider. Document Released: 05/17/2015 Document Revised: 01/08/2016 Document Reviewed: 02/19/2015 Elsevier Interactive Patient Education  2017 [employer] Prevention in the Home Falls can cause injuries. They can happen to people of all ages. There are many things you can do to make your home safe and to help prevent falls. What can I do on the outside of my home?  Regularly fix the edges of walkways and driveways and fix any cracks.  Remove anything that might make you trip as you walk through a door, such as a raised step or threshold.  Trim any bushes or trees on the path to your home.  Use bright outdoor lighting.  Clear any walking paths of anything that might make someone trip, such as rocks or tools.  Regularly check to see if handrails are loose or broken. Make sure that both sides of any steps have handrails.  Any raised decks and porches should have guardrails on the edges.  Have any leaves, snow, or ice cleared regularly.  Use sand or salt on walking paths during winter.  Clean up any spills in your garage right away. This includes  oil or grease spills. What can I do in the bathroom?  Use night lights.  Install grab bars by the toilet and in the tub and shower. Do not use towel bars as grab bars.  Use non-skid mats or decals in the tub or shower.  If you need to sit down in the shower, use a plastic, non-slip stool.  Keep the floor dry. Clean up any water that spills on the floor as soon as it happens.  Remove soap buildup in the tub or shower regularly.  Attach bath mats securely with double-sided non-slip rug tape.  Do not have throw rugs and other things on the floor that can make you trip. What can I do in the bedroom?  Use night lights.  Make sure that you have a light by your bed that is easy to reach.  Do not use any sheets or blankets that are too big for your bed. They should not hang down onto the floor.  Have a firm chair that has side arms. You can use this for support while you get dressed.  Do not have throw rugs and other things on the floor that can make you trip. What can I do in the kitchen?  Clean up any spills right away.  Avoid  walking on wet floors.  Keep items that you use a lot in easy-to-reach places.  If you need to reach something above you, use a strong step stool that has a grab bar.  Keep electrical cords out of the way.  Do not use floor polish or wax that makes floors slippery. If you must use wax, use non-skid floor wax.  Do not have throw rugs and other things on the floor that can make you trip. What can I do with my stairs?  Do not leave any items on the stairs.  Make sure that there are handrails on both sides of the stairs and use them. Fix handrails that are broken or loose. Make sure that handrails are as long as the stairways.  Check any carpeting to make sure that it is firmly attached to the stairs. Fix any carpet that is loose or worn.  Avoid having throw rugs at the top or bottom of the stairs. If you do have throw rugs, attach them to the floor  with carpet tape.  Make sure that you have a light switch at the top of the stairs and the bottom of the stairs. If you do not have them, ask someone to add them for you. What else can I do to help prevent falls?  Wear shoes that:  Do not have high heels.  Have rubber bottoms.  Are comfortable and fit you well.  Are closed at the toe. Do not wear sandals.  If you use a stepladder:  Make sure that it is fully opened. Do not climb a closed stepladder.  Make sure that both sides of the stepladder are locked into place.  Ask someone to hold it for you, if possible.  Clearly mark and make sure that you can see:  Any grab bars or handrails.  First and last steps.  Where the edge of each step is.  Use tools that help you move around (mobility aids) if they are needed. These include:  Canes.  Walkers.  Scooters.  Crutches.  Turn on the lights when you go into a dark area. Replace any light bulbs as soon as they burn out.  Set up your furniture so you have a clear path. Avoid moving your furniture around.  If any of your floors are uneven, fix them.  If there are any pets around you, be aware of where they are.  Review your medicines with your doctor. Some medicines can make you feel dizzy. This can increase your chance of falling. Ask your doctor what other things that you can do to help prevent falls. This information is not intended to replace advice given to you by your health care provider. Make sure you discuss any questions you have with your health care provider. Document Released: 02/14/2009 Document Revised: 09/26/2015 Document Reviewed: 05/25/2014 Elsevier Interactive Patient Education  2017 [employer].

## 2020-07-17 ENCOUNTER — Encounter: Payer: Self-pay | Admitting: Family Medicine

## 2020-07-17 ENCOUNTER — Other Ambulatory Visit: Payer: Self-pay

## 2020-07-17 ENCOUNTER — Ambulatory Visit (INDEPENDENT_AMBULATORY_CARE_PROVIDER_SITE_OTHER): Payer: BC Managed Care – PPO | Admitting: Family Medicine

## 2020-07-17 VITALS — BP 108/72 | HR 98 | Ht 64.0 in | Wt 185.6 lb

## 2020-07-17 DIAGNOSIS — E039 Hypothyroidism, unspecified: Secondary | ICD-10-CM | POA: Diagnosis not present

## 2020-07-17 DIAGNOSIS — R159 Full incontinence of feces: Secondary | ICD-10-CM

## 2020-07-17 DIAGNOSIS — M722 Plantar fascial fibromatosis: Secondary | ICD-10-CM | POA: Diagnosis not present

## 2020-07-17 DIAGNOSIS — C210 Malignant neoplasm of anus, unspecified: Secondary | ICD-10-CM

## 2020-07-17 DIAGNOSIS — F4323 Adjustment disorder with mixed anxiety and depressed mood: Secondary | ICD-10-CM

## 2020-07-17 DIAGNOSIS — K629 Disease of anus and rectum, unspecified: Secondary | ICD-10-CM | POA: Insufficient documentation

## 2020-07-17 NOTE — Progress Notes (Signed)
Subjective:    Patient ID: Brenda Grimes, female    DOB: 1966-02-20, 55 y.o.   MRN: [id number]  Brenda Grimes is a 55 y.o. female presenting on 07/17/2020 for Depression and Foot Pain  Previous PCP Cyndia Skeeters, FNP   HPI   Major Depression, recurrent moderate Prior history of depression reviewed for years, she used to be on Citalopram and other SSRI, and then few years back switched to Wellbutrin XL 150mg  daily, no recent dose changes. 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 She changed position to work in Teacher, adult education now and was expected to be there at [employer], she worked out with previous Freight forwarder and they agreed verbally that she could come in only occasionally at 4am.  History of Anal Cancer with Fecal Incontinence S/p Chemoradiation Still followed by Oncology for surveillance, currently said that there is "no sign of active cancer" but she is on active monitoring and is still being managed by specialist. 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 Usually she manages her diet to control symptoms. Occasionally she has an incontinent accident and often in morning before starting her day and this does delay her and causes her to take additional time for bathroom, showering, hygiene.  Plantar Fasciitis Chronic foot pain, contributing to pain daily, requires stretches in morning to prevent pain and worse if prolong standing and ambulation. She says that this problem does limit her function at times and does require additional time for managing the symptoms as well.  She is requesting Intermittent FMLA for these health conditions that impact her daily function and do interfere with her current work schedule and would warrant additional time out of work for  intermittent or episodic flare up of these health conditions.  Additional history says sometimes thyroid can impact her mood. She requests lab tests.   Depression screen [facility] 2/9 07/17/2020 07/16/2020 11/09/2019  Decreased Interest 1 3 0  Down, Depressed, Hopeless 2 3 0  PHQ - 2 Score 3 6 0  Altered sleeping 2 3 -  Tired, decreased energy 2 3 -  Change in appetite 2 3 -  Feeling bad or failure about yourself  0 0 -  Trouble concentrating 2 3 -  Moving slowly or fidgety/restless 0 0 -  Suicidal thoughts 0 0 -  PHQ-9 Score 11 18 -  Difficult doing work/chores Somewhat difficult Somewhat difficult -  Some recent data might be hidden   GAD 7 : Generalized Anxiety Score 07/17/2020 06/02/2018 05/05/2018  Nervous, Anxious, on Edge 1 1 0  Control/stop worrying 0 3 0  Worry too much - different things 2 3 0  Trouble relaxing 2 3 0  Restless 1 1 0  Easily annoyed or irritable 1 1 0  Afraid - awful might happen 0 1 1  Total GAD 7 Score 7 13 1   Anxiety Difficulty Somewhat difficult Somewhat difficult Not difficult at all     Social History   Tobacco Use  . Smoking status: Former Smoker    Packs/day: 0.75    Years: 30.00    Pack years: 22.50    Quit date: 01/21/2007    Years since quitting: 13.4  . Smokeless tobacco: Never Used  Vaping Use  . Vaping  Use: Never used  Substance Use Topics  . Alcohol use: No  . Drug use: No    Current Outpatient Medications:  .  acetaminophen (TYLENOL) 500 MG tablet, Take 1,000 mg by mouth every 6 (six) hours as needed., Disp: , Rfl:  .  albuterol (VENTOLIN HFA) 108 (90 Base) MCG/ACT inhaler, Inhale 1-2 puffs into the lungs every 6 (six) hours as needed for wheezing or shortness of breath (cough)., Disp: 1 each, Rfl: 0 .  Ascorbic Acid (VITAMIN C) 500 MG CAPS, Take 1 capsule by mouth daily., Disp: , Rfl:  .  buPROPion (WELLBUTRIN XL) 150 MG 24 hr tablet, Take 1 tablet (150 mg total) by mouth daily., Disp: 90 tablet, Rfl: 0 .  cholecalciferol (VITAMIN D)  1000 units tablet, Take 2,000 Units by mouth daily., Disp: , Rfl:  .  cyclobenzaprine (FLEXERIL) 10 MG tablet, Take 1 tablet (10 mg total) by mouth at bedtime as needed for muscle spasms., Disp: 30 tablet, Rfl: 1 .  fluticasone (FLONASE) 50 MCG/ACT nasal spray, Place 1 spray into both nostrils daily as needed., Disp: , Rfl:  .  levothyroxine (SYNTHROID) 88 MCG tablet, TAKE 1 TABLET BY MOUTH ONCE DAILY BEFORE BREAKFAST, Disp: 90 tablet, Rfl: 0 .  loperamide (IMODIUM) 2 MG capsule, Take 2 mg by mouth as needed for diarrhea or loose stools., Disp: , Rfl:  .  naproxen (NAPROSYN) 500 MG tablet, Take 500 mg by mouth 3 (three) times daily as needed., Disp: , Rfl:  .  traMADol (ULTRAM) 50 MG tablet, TAKE 1 TABLET BY MOUTH BY MOUTH TWICE DAILY AS NEEDED FOR MODERATE PAIN (ABDOMINAL OR RECTAL PAIN), Disp: 60 tablet, Rfl: 0 .  vitamin B-12 (CYANOCOBALAMIN) 1000 MCG tablet, Take 1,000 mcg by mouth daily., Disp: , Rfl:  .  Flaxseed, Linseed, (FLAX SEED OIL) 1000 MG CAPS, Take 1 capsule by mouth daily. (Patient not taking: No sig reported), Disp: , Rfl:  .  nitroGLYCERIN (NITROSTAT) 0.4 MG SL tablet, Place 1 tablet (0.4 mg total) under the tongue every 5 (five) minutes as needed for chest pain. (Patient not taking: No sig reported), Disp: 100 tablet, Rfl: 3  Review of Systems Per HPI unless specifically indicated above     Objective:    BP 108/72   Pulse 98   Ht 5\' 4"  (1.626 m)   Wt 185 lb 9.6 oz (84.2 kg)   SpO2 100%   BMI 31.86 kg/m   Wt Readings from Last 3 Encounters:  07/17/20 185 lb 9.6 oz (84.2 kg)  07/16/20 178 lb (80.7 kg)  04/15/20 177 lb 3.2 oz (80.4 kg)    Physical Exam Vitals and nursing note reviewed.  Constitutional:      General: She is not in acute distress.    Appearance: She is well-developed. She is not diaphoretic.     Comments: Well-appearing, comfortable, cooperative  HENT:     Head: Normocephalic and atraumatic.  Eyes:     General:        Right eye: No discharge.         Left eye: No discharge.     Conjunctiva/sclera: Conjunctivae normal.  Cardiovascular:     Rate and Rhythm: Normal rate.  Pulmonary:     Effort: Pulmonary effort is normal.  Skin:    General: Skin is warm and dry.     Findings: No erythema or rash.  Neurological:     Mental Status: She is alert and oriented to person, place, and time.  Psychiatric:  Behavior: Behavior normal.     Comments: Well groomed, good eye contact, normal speech and thoughts    Results for orders placed or performed in visit on 04/15/20  Technologist smear review  Result Value Ref Range   WBC Morphology UNREMARKABLE    RBC Morphology UNREMARKABLE    Tech Review PLATELETS APPEAR ADEQUATE   Folate  Result Value Ref Range   Folate 12.6 >5.9 ng/mL  Vitamin B12  Result Value Ref Range   Vitamin B-12 219 180 - 914 pg/mL  CBC with Differential/Platelet  Result Value Ref Range   WBC 3.8 (L) 4.0 - 10.5 K/uL   RBC 3.95 3.87 - 5.11 MIL/uL   Hemoglobin 13.1 12.0 - 15.0 g/dL   HCT 39.5 36.0 - 46.0 %   MCV 100.0 80.0 - 100.0 fL   MCH 33.2 26.0 - 34.0 pg   MCHC 33.2 30.0 - 36.0 g/dL   RDW 12.6 11.5 - 15.5 %   Platelets 180 150 - 400 K/uL   nRBC 0.0 0.0 - 0.2 %   Neutrophils Relative % 61 %   Neutro Abs 2.4 1.7 - 7.7 K/uL   Lymphocytes Relative 25 %   Lymphs Abs 1.0 0.7 - 4.0 K/uL   Monocytes Relative 9 %   Monocytes Absolute 0.3 0.1 - 1.0 K/uL   Eosinophils Relative 3 %   Eosinophils Absolute 0.1 0.0 - 0.5 K/uL   Basophils Relative 1 %   Basophils Absolute 0.0 0.0 - 0.1 K/uL   Immature Granulocytes 1 %   Abs Immature Granulocytes 0.02 0.00 - 0.07 K/uL      Assessment & Plan:   Problem List Items Addressed This Visit    Plantar fasciitis   Hypothyroidism   Relevant Orders   TSH   T4, free   Fecal incontinence due to anorectal disorder   Anal cancer ([place]) - Primary    Other Visit Diagnoses    Adjustment disorder with mixed anxiety and depressed mood          Anal Cancer S/p  chemoradiation Continues to follow with Oncology Complication from cancer treatment with loss of sphincter muscle tone, now has fecal incontinence episodes  Episodic flare up of fecal incontinence does require additional time for her usually in morning to address the incontinence and hygiene.  Plantar fasciitis additionally contributes to episodic flares with foot pain impacting her function with standing and ambulation.  Her mood is down with adjustment disorder and mixed anxiety related to these problems impacting her current work, now that she is required to be up at very early hour and it is interfering with her ability to get to work and function properly.  It is my medical opinion that she would benefit from Intermittent FMLA for episodic flares of fecal incontinence due to history of Anal Cancer treatment and Plantar Fasciitis.  It is estimated she may have up to 5 flare up episodes in 1 week, and may need up to 2 hours per flare up episode for leave/absence from work.  Will request FMLA paperwork to completed for this particular reason.  Additionally patient requests prior FMLA from January 2022 from [place] to be submitted as well as it was never submitted.   No orders of the defined types were placed in this encounter.    Follow up plan: Return in about 3 months (around 10/17/2020) for 3 month follow-up Mood / PHQ / FMLA updates w/ new provider.   [name], [facility]  [facility] 07/17/2020, 3:38 PM

## 2020-07-17 NOTE — Patient Instructions (Addendum)
Thank you for coming to the office today.  We can do the Intermittent FMLA paperwork for [facility] when it arrives.  Anal Cancer w/ Fecal Incontinence and Plantar Fasciitis  We will recommend the following:  Intermittent episodes of flare up related to both fecal incontinence and plantar fasciitis Estimated flares may occur up to 5 times per week. Each flare would require up to 2 hours per episode. May use this in morning or afternoon/other times.  Return for Thyroid blood test when ready.  Please schedule a Follow-up Appointment to: Return in about 3 months (around 10/17/2020) for 3 month follow-up Mood / PHQ / FMLA updates.  If you have any other questions or concerns, please feel free to call the office or send a message through [place]. You may also schedule an earlier appointment if necessary.  Additionally, you may be receiving a survey about your experience at our office within a few days to 1 week by e-mail or mail. We value your feedback.  [name], DO [facility], [facility]              Plantar Fascia Stretches / Exercises  See other page with pictures of each exercise.  Start with 1 or 2 of these exercises that you are most comfortable with. Do not do any exercises that cause you significant worsening pain. Some of these may cause some "stretching soreness" but it should go away after you stop the exercise, and get better over time. Gradually increase up to 3-4 exercises as tolerated.  You may begin exercising the muscles of your foot right away by gently stretching them as follows:  Stretching: Towel stretch: Sit on a hard surface with your injured leg stretched out in front of you. Loop a towel around the ball of your foot and pull the towel toward your body keeping your knee straight. Hold this position for 15 to 30 seconds then relax. Repeat 3 times. When the towel stretch becomes to easy, you may begin doing the standing calf  stretch.  Standing calf stretch: Facing a wall, put your hands against the wall at about eye level. Keep the injured leg back, the uninjured leg forward, and the heel of your injured leg on the floor. Turn your injured foot slightly inward (as if you were pigeon-toed) as you slowly lean into the wall until you feel a stretch in the back of your calf. Hold for 15 to 30 seconds. Repeat 3 times. Do this exercise several times each day. When you can stand comfortably on your injured foot, you can begin stretching the bottom of your foot using the plantar fascia stretch.  Plantar fascia stretch: Stand with the ball of your injured foot on a stair. Reach for the bottom step with your heel until you feel a stretch in the arch of your foot. Hold this position for 15 to 30 seconds and then relax. Repeat 3 times. After you have stretched the bottom muscles of your foot, you can begin strengthening the top muscles of your foot.  Frozen can roll: Roll your bare injured foot back and forth from your heel to your mid-arch over a frozen juice can. Repeat for 3 to 5 minutes. This exercise is particularly helpful if done first thing in the morning. Towel pickup: With your heel on the ground, pick up a towel with your toes. Release. Repeat 10 to 20 times. When this gets easy, add more resistance by placing a book or small weight on the towel.  Static and dynamic balance exercises Place a chair next to your non-injured leg and stand upright. (This will provide you with balance if needed.) Stand on your injured foot. Try to raise the arch of your foot while keeping your toes on the floor. Try to maintain this position and balance on your injured side for 30 seconds. This exercise can be made more difficult by doing it on a piece of foam or a pillow, or with your eyes closed. Stand in the same position as above. Keep your foot in this position and reach forward in front of you with your injured side's hand, allowing your knee  to bend. Repeat this 10 times while maintaining the arch height. This exercise can be made more difficult by reaching farther in front of you. Do 2 sets. Stand in the same position as above. While maintaining your arch height, reach the injured side's hand across your body toward the chair. The farther you reach, the more challenging the exercise. Do 2 sets of 10.  Next, you can begin strengthening the muscles of your foot and lower leg by using elastic tubing.  Strengthening: Resisted dorsiflexion: Sit with your injured leg out straight and your foot facing a doorway. Tie a loop in one end of the tubing. Put your foot through the loop so that the tubing goes around the arch of your foot. Tie a knot in the other end of the tubing and shut the knot in the door. Move backward until there is tension in the tubing. Keeping your knee straight, pull your foot toward your body, stretching the tubing. Slowly return to the starting position. Do 3 sets of 10. Resisted plantar flexion: Sit with your leg outstretched and loop the middle section of the tubing around the ball of your foot. Hold the ends of the tubing in both hands. Gently press the ball of your foot down and point your toes, stretching the tubing. Return to the starting position. Do 3 sets of 10. Resisted inversion: Sit with your legs out straight and cross your uninjured leg over your injured ankle. Wrap the tubing around the ball of your injured foot and then loop it around your uninjured foot so that the tubing is anchored there at one end. Hold the other end of the tubing in your hand. Turn your injured foot inward and upward. This will stretch the tubing. Return to the starting position. Do 3 sets of 10. Resisted eversion: Sit with both legs stretched out in front of you, with your feet about a shoulder's width apart. Tie a loop in one end of the tubing. Put your injured foot through the loop so that the tubing goes around the arch of that foot and  wraps around the outside of the uninjured foot. Hold onto the other end of the tubing with your hand to provide tension. Turn your injured foot up and out. Make sure you keep your uninjured foot still so that it will allow the tubing to stretch as you move your injured foot. Return to the starting position. Do 3 sets of 10.

## 2020-07-22 ENCOUNTER — Other Ambulatory Visit: Payer: Self-pay | Admitting: Family Medicine

## 2020-07-22 DIAGNOSIS — G894 Chronic pain syndrome: Secondary | ICD-10-CM

## 2020-07-22 NOTE — Telephone Encounter (Signed)
Requested medication (s) are due for refill today: yes  Requested medication (s) are on the active medication list: yes  Last refill:  06/14/20  Future visit scheduled: no  Notes to clinic:  med not delegated to NT to RF   Requested Prescriptions  Pending Prescriptions Disp Refills   traMADol (ULTRAM) 50 MG tablet [Pharmacy Med Name: traMADol HCl 50 MG Oral Tablet] 60 tablet 0    Sig: TAKE 1 TABLET BY MOUTH TWICE DAILY AS NEEDED FOR  MODERATE  PAIN  (ABDOMINAL  OR  RECTAL  PAIN)      Not Delegated - Analgesics:  Opioid Agonists Failed - 07/22/2020 12:13 PM      Failed - This refill cannot be delegated      Failed - Urine Drug Screen completed in last 360 days      Passed - Valid encounter within last 6 months    Recent Outpatient Visits           5 days ago Anal cancer [facility])   [place], DO   2 months ago COVID-19 virus infection   [place], DO   3 months ago Arthralgia, unspecified joint   [facility], Lupita Raider, FNP   8 months ago Fatigue, unspecified type   [facility], Lupita Raider, FNP   8 months ago Acute nonintractable headache, unspecified headache type   [facility], Lupita Raider, FNP       Future Appointments             In 1 year [facility], [facility]

## 2020-08-09 ENCOUNTER — Encounter: Payer: Self-pay | Admitting: Physician Assistant

## 2020-08-09 ENCOUNTER — Other Ambulatory Visit: Payer: Self-pay

## 2020-08-09 ENCOUNTER — Telehealth: Payer: Self-pay

## 2020-08-09 ENCOUNTER — Telehealth (INDEPENDENT_AMBULATORY_CARE_PROVIDER_SITE_OTHER): Payer: Medicare Other | Admitting: Physician Assistant

## 2020-08-09 DIAGNOSIS — N959 Unspecified menopausal and perimenopausal disorder: Secondary | ICD-10-CM | POA: Diagnosis not present

## 2020-08-09 DIAGNOSIS — F32A Depression, unspecified: Secondary | ICD-10-CM

## 2020-08-09 MED ORDER — PAROXETINE HCL 10 MG PO TABS: 10.0000 mg | ORAL_TABLET | Freq: Every day | ORAL | 0 refills | Status: DC

## 2020-08-09 NOTE — Patient Instructions (Signed)
[email or web address]">  Depression Screening Depression screening is a tool that your health care provider can use to learn if you have symptoms of depression. Depression is a common condition with many symptoms that are also often found in other conditions. Depression is treatable, but it must first be diagnosed. You may not know that certain feelings, thoughts, and behaviors that you are having can be symptoms of depression. Taking a depression screening test can help you and your health care provider decide if you need more assessment, or if you should be referred to a mental health care provider. What are the screening tests?  You may have a physical exam to see if another condition is affecting your mental health. You may have a blood or urine sample taken during the physical exam.  You may be interviewed using a screening tool that was developed from research, such as one of these: ? Patient Health Questionnaire (PHQ). This is a set of either 2 or 9 questions. A health care provider who has been trained to score this screening test uses a guide to assess if your symptoms suggest that you may have depression. ? Hamilton Depression Rating Scale (HAM-D). This is a set of either 17 or 24 questions. You may be asked to take it again during or after your treatment, to see if your depression has gotten better. ? Beck Depression Inventory (BDI). This is a set of 21 multiple choice questions. Your health care provider scores your answers to assess:  Your level of depression, ranging from mild to severe.  Your response to treatment.  Your health care provider may talk with you about your daily activities, such as eating, sleeping, work, and recreation, and ask if you have had any changes in activity.  Your health care provider may ask you to see a mental health specialist, such as a psychiatrist or psychologist, for more  evaluation. Who should be screened for depression?  All adults, including adults with a family history of a mental health disorder.  Adolescents who are 89-77 years old.  People who are recovering from a myocardial infarction (MI).  Pregnant women, or women who have given birth.  People who have a long-term (chronic) illness.  Anyone who has been diagnosed with another type of a mental health disorder.  Anyone who has symptoms that could show depression.   What do my results mean? Your health care provider will review the results of your depression screening, physical exam, and lab tests. Positive screens suggest that you may have depression. Screening is the first step in getting the care that you may need. It is up to you to get your screening results. Ask your health care provider, or the department that is doing your screening tests, when your results will be ready. Talk with your health care provider about your results and diagnosis. A diagnosis of depression is made using the Diagnostic and Statistical Manual of Mental Disorders (DSM-V). This is a book that lists the number and type of symptoms that must be present for a health care provider to give a specific diagnosis.  Your health care provider may work with you to treat your symptoms of depression, or your health care provider may help you find a mental health provider who can assess, diagnose, and treat your depression. Get help right away if:  You have thoughts about hurting yourself or others. If you ever feel like you may hurt yourself or others, or have thoughts about taking your own life, get help  right away. You can go to your nearest emergency department or call:  Your local emergency services (911 in the [place]  A suicide crisis helpline, such as the [place] at (779)432-9194. This is open 24 hours a day. Summary  Depression screening is the first step in getting the help that you may  need.  If your screening test shows symptoms of depression (is positive), your health care provider may ask you to see a mental health provider.  Anyone who is age 33 or older should be screened for depression. This information is not intended to replace advice given to you by your health care provider. Make sure you discuss any questions you have with your health care provider. Document Revised: 10/12/2019 Document Reviewed: 10/12/2019 Elsevier Patient Education  [place].

## 2020-08-09 NOTE — Telephone Encounter (Signed)
The pt was scheduled for an appt on Brenda Grimes schedule.

## 2020-08-09 NOTE — Telephone Encounter (Signed)
Copied from [place] [id number]. Topic: General - Other >> Aug 09, 2020  1:40 PM Pawlus, Brayton Layman A wrote: Reason for CRM: Pt wanted to know if Dr Raliegh Ip would be able to send in something for her depression, I offered the pt an appt on Monday 4/11, but pt stated she is feeling depressed due to personal issues and was not sure if there was something to help her with before then. Pt did not end up scheduling an appts. Please advise.

## 2020-08-09 NOTE — Progress Notes (Signed)
Virtual telephone visit    Virtual Visit via Telephone Note   This visit type was conducted due to national recommendations for restrictions regarding the COVID-19 Pandemic (e.g. social distancing) in an effort to limit this patient's exposure and mitigate transmission in our community. Due to her co-morbid illnesses, this patient is at least at moderate risk for complications without adequate follow up. This format is felt to be most appropriate for this patient at this time. The patient did not have access to video technology or had technical difficulties with video requiring transitioning to audio format only (telephone). Physical exam was limited to content and character of the telephone converstion.    Patient location: Home Provider location: Office  I discussed the limitations of evaluation and management by telemedicine and the availability of in person appointments. The patient expressed understanding and agreed to proceed.   Visit Date: 08/09/2020  Today's healthcare provider: Trinna Post, PA-C   Chief Complaint  Patient presents with  . Depression    Pt recently fired from her job and been real upset and crying spells x 3 weeks    Subjective    HPI HPI    Depression    Comments: Pt recently fired from her job and been real upset and crying spells x 3 weeks        Last edited by Wilson Singer, CMA on 08/09/2020  2:42 PM. (History)      Depression, Follow-up  She  was last seen for this 1 months ago. Changes made at last visit include continue wellbutrin 150.   She reports excellent compliance with treatment. She is not having side effects. She reports she recently lost her job but in the interim has sorted this out. Reports she is currently in menopause and can't receive treatment for this due to heart history. Reports it is difficult to navigate dynamics at work. She was previously on celexa but is no longer taking this medication. She stopped taking  celexa due to vaginal dryness. She reports she felt celexa did not help her at the time she was taking it. She thinks she will pursue employee assistance program.   She reports excellent tolerance of treatment. Current symptoms include: depressed mood She feels she is Worse since last visit.  Depression screen [facility] 2/9 08/09/2020 07/17/2020 07/16/2020  Decreased Interest 3 1 3   Down, Depressed, Hopeless 3 2 3   PHQ - 2 Score 6 3 6   Altered sleeping 2 2 3   Tired, decreased energy 3 2 3   Change in appetite 3 2 3   Feeling bad or failure about yourself  3 0 0  Trouble concentrating 3 2 3   Moving slowly or fidgety/restless 0 0 0  Suicidal thoughts 0 0 0  PHQ-9 Score 20 11 18   Difficult doing work/chores Somewhat difficult Somewhat difficult Somewhat difficult  Some recent data might be hidden    -----------------------------------------------------------------------------------------      Medications: Outpatient Medications Prior to Visit  Medication Sig  . acetaminophen (TYLENOL) 500 MG tablet Take 1,000 mg by mouth every 6 (six) hours as needed.  Marland Kitchen albuterol (VENTOLIN HFA) 108 (90 Base) MCG/ACT inhaler Inhale 1-2 puffs into the lungs every 6 (six) hours as needed for wheezing or shortness of breath (cough).  . Ascorbic Acid (VITAMIN C) 500 MG CAPS Take 1 capsule by mouth daily.  Marland Kitchen buPROPion (WELLBUTRIN XL) 150 MG 24 hr tablet Take 1 tablet (150 mg total) by mouth daily.  . cholecalciferol (VITAMIN D) 1000  units tablet Take 2,000 Units by mouth daily.  . cyclobenzaprine (FLEXERIL) 10 MG tablet Take 1 tablet (10 mg total) by mouth at bedtime as needed for muscle spasms.  . fluticasone (FLONASE) 50 MCG/ACT nasal spray Place 1 spray into both nostrils daily as needed.  Marland Kitchen levothyroxine (SYNTHROID) 88 MCG tablet TAKE 1 TABLET BY MOUTH ONCE DAILY BEFORE BREAKFAST  . loperamide (IMODIUM) 2 MG capsule Take 2 mg by mouth as needed for diarrhea or loose stools.  . naproxen (NAPROSYN) 500 MG tablet  Take 500 mg by mouth 3 (three) times daily as needed.  . traMADol (ULTRAM) 50 MG tablet Take 1 tablet (50 mg total) by mouth 2 (two) times daily as needed for moderate pain (abdominal or rectal pain).  . vitamin B-12 (CYANOCOBALAMIN) 1000 MCG tablet Take 1,000 mcg by mouth daily.  . Flaxseed, Linseed, (FLAX SEED OIL) 1000 MG CAPS Take 1 capsule by mouth daily. (Patient not taking: Reported on 08/09/2020)  . nitroGLYCERIN (NITROSTAT) 0.4 MG SL tablet Place 1 tablet (0.4 mg total) under the tongue every 5 (five) minutes as needed for chest pain. (Patient not taking: No sig reported)   No facility-administered medications prior to visit.    Review of Systems  All other systems reviewed and are negative.     Objective    There were no vitals taken for this visit.     Assessment & Plan    1. Depression, unspecified depression type  Start Paxil as below for both menopausal symptoms and worsening symptoms of depression. Counseled on risk of serotonin syndrome, though Paxil is at low dose and she is using tramadol intermittently. She will pursue employee assistance counseling. She knows can call back with issues. Follow up in 4 weeks.   - PARoxetine (PAXIL) 10 MG tablet; Take 1 tablet (10 mg total) by mouth daily.  Dispense: 90 tablet; Refill: 0  2. Menopausal disorder  - PARoxetine (PAXIL) 10 MG tablet; Take 1 tablet (10 mg total) by mouth daily.  Dispense: 90 tablet; Refill: 0   Return in about 4 weeks (around 09/06/2020) for depression .    I discussed the assessment and treatment plan with the patient. The patient was provided an opportunity to ask questions and all were answered. The patient agreed with the plan and demonstrated an understanding of the instructions.   The patient was advised to call back or seek an in-person evaluation if the symptoms worsen or if the condition fails to improve as anticipated.   Trinna Post, PA-C [facility] [phone]  (phone) [phone] (fax)  [place]

## 2020-08-13 ENCOUNTER — Ambulatory Visit: Payer: Self-pay | Admitting: *Deleted

## 2020-08-13 NOTE — Telephone Encounter (Signed)
Patient is calling to report she has SE with Paxil- palpations/dhest discomfort. Patient has stopped medication - no dose yesterday and today- side effects have stopped. Advised patient will send message to PCP for review and options. Patient is willing to try something else- she feels she needs something. Medication history: Lexapro-Celexa- Wellbutrin-presently  Reason for Disposition . [1] Caller has NON-URGENT medicine question about med that PCP prescribed AND [2] triager unable to answer question  Answer Assessment - Initial Assessment Questions 1. NAME of MEDICATION: "What medicine are you calling about?"     paxil 2. QUESTION: "What is your question?" (e.g., medication refill, side effect)     SE 3. PRESCRIBING HCP: "Who prescribed it?" Reason: if prescribed by specialist, call should be referred to that group.     PCP- NP 4. SYMPTOMS: "Do you have any symptoms?"   Chest discomfort- palpitation 5. SEVERITY: If symptoms are present, ask "Are they mild, moderate or severe?"     moderate 6. PREGNANCY:  "Is there any chance that you are pregnant?" "When was your last menstrual period?"     n/a  Protocols used: MEDICATION QUESTION CALL-A-AH

## 2020-08-14 NOTE — Telephone Encounter (Signed)
I left a detail message on the patient personal vm that she need to give the office a call and schedule an appt.

## 2020-08-27 ENCOUNTER — Telehealth: Payer: Self-pay | Admitting: Family Medicine

## 2020-08-27 NOTE — Telephone Encounter (Signed)
Patient states Brenda Grimes has not received FMLA paperwork, case was closed on 08/16/2020 and now its reopen. FMLA paperwork is for  05/17/2020 through 05/23/2020 due to patient being dx with COVID and immune system being  Compromised. Brenda Grimes is faxing again to [phone] please note when received. Patient would like a follow up call regarding status

## 2020-08-27 NOTE — Telephone Encounter (Signed)
I have not seen this paperwork. We never received this particular FMLA case work previously.  We did complete the FMLA that started in March for Cancer.  However I do not currently have the FMLA being requested now from 05/17/20 through 05/23/20.  Is it in the office somewhere still being processed? Or can we obtain this paperwork so that I can work on it for her?  [name], [facility] 08/27/2020, 5:46 PM

## 2020-08-28 NOTE — Telephone Encounter (Signed)
The pt called and stated that she will have them to refax the [facility] paperwork over.

## 2020-09-04 ENCOUNTER — Other Ambulatory Visit: Payer: Self-pay

## 2020-09-04 DIAGNOSIS — E039 Hypothyroidism, unspecified: Secondary | ICD-10-CM

## 2020-09-04 MED ORDER — LEVOTHYROXINE SODIUM 88 MCG PO TABS: ORAL_TABLET | ORAL | 0 refills | Status: DC

## 2020-09-04 NOTE — Telephone Encounter (Signed)
Patient called in to speak to [facility] in reference to the [facility] paper work that was discussed on 08/28/20. Per patient Brenda Grimes is still waiting to get these forms back please call patient at Ph# [phone]

## 2020-09-04 NOTE — Telephone Encounter (Signed)
I spoke with the patient and notified her that we still have not received the [facility] paperwork. I advised her to maybe have them to fax it to her and bring the paper in the office.

## 2020-09-12 ENCOUNTER — Other Ambulatory Visit: Payer: Self-pay

## 2020-09-12 ENCOUNTER — Ambulatory Visit (INDEPENDENT_AMBULATORY_CARE_PROVIDER_SITE_OTHER): Payer: Medicare Other | Admitting: Internal Medicine

## 2020-09-12 ENCOUNTER — Encounter: Payer: Self-pay | Admitting: Internal Medicine

## 2020-09-12 DIAGNOSIS — F339 Major depressive disorder, recurrent, unspecified: Secondary | ICD-10-CM

## 2020-09-12 DIAGNOSIS — N951 Menopausal and female climacteric states: Secondary | ICD-10-CM | POA: Diagnosis not present

## 2020-09-12 MED ORDER — HYDROXYZINE HCL 10 MG PO TABS: 10.0000 mg | ORAL_TABLET | Freq: Every day | ORAL | 0 refills | Status: DC | PRN

## 2020-09-12 NOTE — Assessment & Plan Note (Signed)
Persistent Paroxetine d/c'd as she stopped this on her own Continue Wellbutrin She would like something to take on an as needed bases for when she has bad days RX for Hydroxyzine 10 mg daily prn- sedation caution given She is not interested in seeing a therapist at this time Support offered, will monitor

## 2020-09-12 NOTE — Progress Notes (Signed)
Virtual Visit via Telephone Note  I connected with Brenda Grimes on 09/12/20 at 10:00 AM EDT by telephone and verified that I am speaking with the correct person using two identifiers.  Location: Patient: Home Provider: Office  Person's participating in this telephone call: Webb Silversmith, NP and Brenda Grimes   I discussed the limitations, risks, security and privacy concerns of performing an evaluation and management service by telephone and the availability of in person appointments. I also discussed with the patient that there may be a patient responsible charge related to this service. The patient expressed understanding and agreed to proceed.   History of Present Illness:  Pt due for 1 month follow up of depression and menopausal symptoms. At her last visit, she was started on Paroxetine in addition to her Wellbutrin. She reports she only took the Paroxetine for 4 days and stopped it. She is not clear why she stopped it, maybe the side effects. She has taken Escitalopram in the past as well. She is having difficulty staying asleep. She reports poor appetite but attributes this to GI issues from her cancer treatment. She is moody and irritable. She intermittently has hot flashes. She is not currently seeing a therapist. She denies SI/HI.    Past Medical History:  Diagnosis Date  . Arthritis   . CAD (coronary artery disease)    s/p stenting x 2  . Cancer ([place])    anus cancer  . Colon polyp   . Depression   . Hyperlipidemia   . MI (myocardial infarction) ([place])   . Thyroid disease   . Vitamin D deficiency     Current Outpatient Medications  Medication Sig Dispense Refill  . hydrOXYzine (ATARAX/VISTARIL) 10 MG tablet Take 1 tablet (10 mg total) by mouth daily as needed. 30 tablet 0  . acetaminophen (TYLENOL) 500 MG tablet Take 1,000 mg by mouth every 6 (six) hours as needed.    Marland Kitchen albuterol (VENTOLIN HFA) 108 (90 Base) MCG/ACT inhaler Inhale 1-2 puffs into the lungs every 6 (six)  hours as needed for wheezing or shortness of breath (cough). 1 each 0  . Ascorbic Acid (VITAMIN C) 500 MG CAPS Take 1 capsule by mouth daily.    Marland Kitchen buPROPion (WELLBUTRIN XL) 150 MG 24 hr tablet Take 1 tablet (150 mg total) by mouth daily. 90 tablet 0  . Cholecalciferol (VITAMIN D) 10 MCG/ML LIQD Take 2,000 Units by mouth daily.    . cyclobenzaprine (FLEXERIL) 10 MG tablet Take 1 tablet (10 mg total) by mouth at bedtime as needed for muscle spasms. 30 tablet 1  . fluticasone (FLONASE) 50 MCG/ACT nasal spray Place 1 spray into both nostrils daily as needed.    Marland Kitchen levothyroxine (SYNTHROID) 88 MCG tablet TAKE 1 TABLET BY MOUTH ONCE DAILY BEFORE BREAKFAST 90 tablet 0  . loperamide (IMODIUM) 2 MG capsule Take 2 mg by mouth as needed for diarrhea or loose stools.    . naproxen (NAPROSYN) 500 MG tablet Take 500 mg by mouth 3 (three) times daily as needed.    . nitroGLYCERIN (NITROSTAT) 0.4 MG SL tablet Place 1 tablet (0.4 mg total) under the tongue every 5 (five) minutes as needed for chest pain. (Patient not taking: No sig reported) 100 tablet 3  . PARoxetine (PAXIL) 10 MG tablet Take 1 tablet (10 mg total) by mouth daily. 90 tablet 0  . traMADol (ULTRAM) 50 MG tablet Take 1 tablet (50 mg total) by mouth 2 (two) times daily as needed for moderate pain (abdominal  or rectal pain). 60 tablet 1  . vitamin B-12 (CYANOCOBALAMIN) 1000 MCG tablet Take 1,000 mcg by mouth daily.     No current facility-administered medications for this visit.    Allergies  Allergen Reactions  . Sulfa Antibiotics Rash  . Sulfasalazine Rash  . Tape Rash    Family History  Problem Relation Age of Onset  . Alzheimer's disease Mother   . CAD Father   . Alzheimer's disease Maternal Grandmother   . Thyroid disease Brother   . Hyperlipidemia Brother   . Hyperlipidemia Brother   . Alcohol abuse Brother   . Healthy Daughter   . Healthy Son   . Heart disease Paternal Uncle   . Heart disease Paternal Grandmother   . Heart  disease Paternal Grandfather   . Thyroid disease Brother   . Cirrhosis Brother   . Healthy Son   . Breast cancer Cousin     Social History   Socioeconomic History  . Marital status: Married    Spouse name: Not on file  . Number of children: Not on file  . Years of education: Not on file  . Highest education level: Not on file  Occupational History  . Not on file  Tobacco Use  . Smoking status: Former Smoker    Packs/day: 0.75    Years: 30.00    Pack years: 22.50    Quit date: 01/21/2007    Years since quitting: 13.6  . Smokeless tobacco: Never Used  Vaping Use  . Vaping Use: Never used  Substance and Sexual Activity  . Alcohol use: No  . Drug use: No  . Sexual activity: Yes    Birth control/protection: None  Other Topics Concern  . Not on file  Social History Narrative  . Not on file   Social Determinants of Health   Financial Resource Strain: Low Risk   . Difficulty of Paying Living Expenses: Not hard at all  Food Insecurity: No Food Insecurity  . Worried About Charity fundraiser in the Last Year: Never true  . Ran Out of Food in the Last Year: Never true  Transportation Needs: No Transportation Needs  . Lack of Transportation (Medical): No  . Lack of Transportation (Non-Medical): No  Physical Activity: Inactive  . Days of Exercise per Week: 0 days  . Minutes of Exercise per Session: 0 min  Stress: No Stress Concern Present  . Feeling of Stress : Not at all  Social Connections: Not on file  Intimate Partner Violence: Not on file     Constitutional: Denies fever, malaise, fatigue, headache or abrupt weight changes.  Respiratory: Denies difficulty breathing, shortness of breath, cough or sputum production.   Cardiovascular: Denies chest pain, chest tightness, palpitations or swelling in the hands or feet.  Gastrointestinal: Pt reports poor appetite, abdominal pain and diarrhea (chronic). Denies bloating, constipation, or blood in the stool.  Neurological:  Pt reports difficulty staying asleep, hot flashes. Denies dizziness, difficulty with memory, difficulty with speech or problems with balance and coordination.  Psych: Pt reports depression. Denies anxiety, SI/HI.  No other specific complaints in a complete review of systems (except as listed in HPI above).  Observations/Objective:  There were no vitals taken for this visit. Wt Readings from Last 3 Encounters:  07/17/20 185 lb 9.6 oz (84.2 kg)  07/16/20 178 lb (80.7 kg)  04/15/20 177 lb 3.2 oz (80.4 kg)    General: In NAD. Pulmonary/Chest: Normal effort. No respiratory distress. N Neurological: Alert and oriented.  Psychiatric: udgment and thought content normal.     BMET    Component Value Date/Time   NA 142 04/04/2020 0904   K 4.7 04/04/2020 0904   CL 107 04/04/2020 0904   CO2 23 04/04/2020 0904   GLUCOSE 96 04/04/2020 0904   BUN 18 04/04/2020 0904   CREATININE 0.83 04/04/2020 0904   CALCIUM 9.2 04/04/2020 0904   GFRNONAA 79 04/04/2020 0904   GFRAA 91 04/04/2020 0904    Lipid Panel     Component Value Date/Time   CHOL 183 09/07/2019 1001   TRIG 66 09/07/2019 1001   HDL 61 09/07/2019 1001   CHOLHDL 3.0 09/07/2019 1001   LDLCALC 107 (H) 09/07/2019 1001    CBC    Component Value Date/Time   WBC 3.8 (L) 04/15/2020 1209   RBC 3.95 04/15/2020 1209   HGB 13.1 04/15/2020 1209   HCT 39.5 04/15/2020 1209   PLT 180 04/15/2020 1209   MCV 100.0 04/15/2020 1209   MCH 33.2 04/15/2020 1209   MCHC 33.2 04/15/2020 1209   RDW 12.6 04/15/2020 1209   LYMPHSABS 1.0 04/15/2020 1209   MONOABS 0.3 04/15/2020 1209   EOSABS 0.1 04/15/2020 1209   BASOSABS 0.0 04/15/2020 1209    Hgb A1C No results found for: HGBA1C     Assessment and Plan:   Follow Up Instructions:    I discussed the assessment and treatment plan with the patient. The patient was provided an opportunity to ask questions and all were answered. The patient agreed with the plan and demonstrated an  understanding of the instructions.   The patient was advised to call back or seek an in-person evaluation if the symptoms worsen or if the condition fails to improve as anticipated.  I provided 6:43 minutes of non-face-to-face time during this encounter.   Webb Silversmith, NP

## 2020-09-12 NOTE — Assessment & Plan Note (Signed)
Failed Paroxetine She is not interested in starting another medication at this time Can take [place] or Black Cohosh OTC Would avoid hormonal therapy in her

## 2020-09-12 NOTE — Patient Instructions (Signed)
Major Depressive Disorder, Adult Major depressive disorder is a mental health condition. This disorder affects feelings. It can also affect the body. Symptoms of this condition last most of the day, almost every day, for 2 weeks. This disorder can affect:  Relationships.  Daily activities, such as work and school.  Activities that you normally like to do. What are the causes? The cause of this condition is not known. The disorder is likely caused by a mix of things, including:  Your personality, such as being a shy person.  Your behavior, or how you act toward others.  Your thoughts and feelings.  Too much alcohol or drugs.  How you react to stress.  Health and mental problems that you have had for a long time.  Things that hurt you in the past (trauma).  Big changes in your life, such as divorce. What increases the risk? The following factors may make you more likely to develop this condition:  Having family members with depression.  Being a woman.  Problems in the family.  Low levels of some brain chemicals.  Things that caused you pain as a child, especially if you lost a parent or were abused.  A lot of stress in your life, such as from: ? Living without basic needs of life, such as food and shelter. ? Being treated poorly because of race, sex, or religion (discrimination).  Health and mental problems that you have had for a long time. What are the signs or symptoms? The main symptoms of this condition are:  Being sad all the time.  Being grouchy all the time.  Loss of interest in things and activities. Other symptoms include:  Sleeping too much or too little.  Eating too much or too little.  Gaining or losing weight, without knowing why.  Feeling tired or having low energy.  Being restless and weak.  Feeling hopeless, worthless, or guilty.  Trouble thinking clearly or making decisions.  Thoughts of hurting yourself or others, or thoughts of  ending your life.  Spending a lot of time alone.  Inability to complete common tasks of daily life. If you have very bad MDD, you may:  Believe things that are not true.  Hear, see, taste, or feel things that are not there.  Have mild depression that lasts for at least 2 years.  Feel very sad and hopeless.  Have trouble speaking or moving. How is this treated? This condition may be treated with:  Talk therapy. This teaches you to know bad thoughts, feelings, and actions and how to change them. ? This can also help you to communicate with others. ? This can be done with members of your family.  Medicines. These can be used to treat worry (anxiety), depression, or low levels of chemicals in the brain.  Lifestyle changes. You may need to: ? Limit alcohol use. ? Limit drug use. ? Get regular exercise. ? Get plenty of sleep. ? Make healthy eating choices. ? Spend more time outdoors.  Brain stimulation. This treatment excites the brain. This is done when symptoms are very bad or have not gotten better with other treatments. Follow these instructions at home: Activity  Get regular exercise as told.  Spend time outdoors as told.  Make time to do the things you enjoy.  Find ways to deal with stress. Try to: ? Meditate. ? Do deep breathing. ? Spend time in nature. ? Keep a journal.  Return to your normal activities as told by your doctor.   Ask your doctor what activities are safe for you. Alcohol and drug use  If you drink alcohol: ? Limit how much you use to:  0-1 drink a day for women.  0-2 drinks a day for men. ? Be aware of how much alcohol is in your drink. In the [place] one drink equals one 12 oz bottle of beer (355 mL), one 5 oz glass of wine (148 mL), or one 1 oz glass of hard liquor (44 mL).  Talk to your doctor about: ? Alcohol use. Alcohol can affect some medicines. ? Any drug use. General instructions  Take over-the-counter and prescription medicines  and herbal preparations only as told by your doctor.  Eat a healthy diet.  Get a lot of sleep.  Think about joining a support group. Your doctor may be able to suggest one.  Keep all follow-up visits as told by your doctor. This is important.   Where to find more information:  National Alliance on Mental Illness: www.nami.org  [place] National Institute of Mental Health: www.nimh.nih.gov  American Psychiatric Association: www.psychiatry.org/patients-families/ Contact a doctor if:  Your symptoms get worse.  You get new symptoms. Get help right away if:  You hurt yourself.  You have serious thoughts about hurting yourself or others.  You see, hear, taste, smell, or feel things that are not there. If you ever feel like you may hurt yourself or others, or have thoughts about taking your own life, get help right away. Go to your nearest emergency department or:  Call your local emergency services (911 in the [place]  Call a suicide crisis helpline, such as the National Suicide Prevention Lifeline at 1-800-273-8255. This is open 24 hours a day in the [place]  Text the Crisis Text Line at 741741 (in the [place] Summary  Major depressive disorder is a mental health condition. This disorder affects feelings. Symptoms of this condition last most of the day, almost every day, for 2 weeks.  The symptoms of this disorder can cause problems with relationships and with daily activities.  There are treatments and support for people who get this disorder. You may need more than one type of treatment.  Get help right away if you have serious thoughts about hurting yourself or others. This information is not intended to replace advice given to you by your health care provider. Make sure you discuss any questions you have with your health care provider. Document Revised: 04/01/2019 Document Reviewed: 04/01/2019 Elsevier Patient Education  2021 Elsevier Inc.  

## 2020-09-17 ENCOUNTER — Other Ambulatory Visit: Payer: Self-pay | Admitting: Family Medicine

## 2020-09-17 DIAGNOSIS — F339 Major depressive disorder, recurrent, unspecified: Secondary | ICD-10-CM

## 2020-09-19 ENCOUNTER — Encounter: Payer: Self-pay | Admitting: Internal Medicine

## 2020-09-20 NOTE — Telephone Encounter (Signed)
No she was just seen. I have not seen her FMLA forms but will be on the lookout in my inbox.

## 2020-09-20 NOTE — Telephone Encounter (Signed)
See below

## 2020-09-26 NOTE — Telephone Encounter (Signed)
To pcp

## 2020-10-01 ENCOUNTER — Encounter: Payer: Self-pay | Admitting: Internal Medicine

## 2020-10-14 ENCOUNTER — Other Ambulatory Visit: Payer: Medicare Other

## 2020-10-15 ENCOUNTER — Telehealth: Payer: Medicare Other | Admitting: Oncology

## 2020-11-06 ENCOUNTER — Encounter: Payer: Self-pay | Admitting: Internal Medicine

## 2020-11-06 DIAGNOSIS — G894 Chronic pain syndrome: Secondary | ICD-10-CM

## 2020-11-08 MED ORDER — TRAMADOL HCL 50 MG PO TABS: 50.0000 mg | ORAL_TABLET | Freq: Every day | ORAL | 0 refills | Status: DC | PRN

## 2020-11-28 MED ORDER — TRAMADOL HCL 50 MG PO TABS: 50.0000 mg | ORAL_TABLET | Freq: Two times a day (BID) | ORAL | 0 refills | Status: DC | PRN

## 2020-11-28 NOTE — Addendum Note (Signed)
Addended by: Jearld Fenton on: 11/28/2020 02:58 PM   Modules accepted: Orders

## 2020-12-03 ENCOUNTER — Encounter: Payer: Self-pay | Admitting: Internal Medicine

## 2020-12-09 MED ORDER — VENLAFAXINE HCL ER 37.5 MG PO CP24: 37.5000 mg | ORAL_CAPSULE | Freq: Every day | ORAL | 2 refills | Status: DC

## 2020-12-10 ENCOUNTER — Other Ambulatory Visit: Payer: Self-pay | Admitting: Family Medicine

## 2020-12-10 DIAGNOSIS — F339 Major depressive disorder, recurrent, unspecified: Secondary | ICD-10-CM

## 2020-12-10 NOTE — Telephone Encounter (Signed)
   Notes to clinic:  Review for refill Looks like medication may have changed    Requested Prescriptions  Pending Prescriptions Disp Refills   buPROPion (WELLBUTRIN XL) 150 MG 24 hr tablet [Pharmacy Med Name: buPROPion HCl ER (XL) 150 MG Oral Tablet Extended Release 24 Hour] 30 tablet 0    Sig: Take 1 tablet by mouth once daily      Psychiatry: Antidepressants - bupropion Passed - 12/10/2020 10:20 AM      Passed - Completed PHQ-2 or PHQ-9 in the last 360 days      Passed - Last BP in normal range    BP Readings from Last 1 Encounters:  07/17/20 108/72          Passed - Valid encounter within last 6 months    Recent Outpatient Visits           2 months ago Hot flashes due to menopause   [place], Coralie Keens, NP   4 months ago Depression, unspecified depression type   [place], [place]   4 months ago Anal cancer [facility])   [place], DO   7 months ago COVID-19 virus infection   [place], DO   8 months ago Arthralgia, unspecified joint   [facility], Lupita Raider, FNP       Future Appointments             In 7 months [facility]

## 2020-12-26 ENCOUNTER — Other Ambulatory Visit: Payer: Self-pay

## 2020-12-26 ENCOUNTER — Ambulatory Visit: Payer: Medicaid Other | Admitting: Internal Medicine

## 2020-12-26 NOTE — Progress Notes (Deleted)
Subjective:    Patient ID: Brenda Grimes, female    DOB: 09-21-1965, 55 y.o.   MRN: [id number]  HPI  Patient presents the clinic today for her annual exam.  She is also due to follow-up chronic conditions.  HLD with CAD: Her last LDL was, triglycerides.  She is not taking any cholesterol-lowering medication at this time.  She tries to consume a low-fat diet.  History of Anal Cancer with Fecal Incontinence: Managed with Imodium.  She takes Tramadol as needed for rectal pain status post radiation.  She follows with GI.  OA/Chronic Pain Syndrome: Mainly in her low back.  She takes tramadol as needed with some relief of symptoms.  She is not currently following with orthopedics.  Hypothyroidism: She denies any issues on her current dose of Levothyroxine.  She is not following with endocrinology.  Menopausal Symptoms: Mainly hot flashes, managed on Venlafaxine.  She does not follow with GYN.  Depression: Chronic dysthymia.  She is currently taking Venlafaxine, Wellbutrin and Hydroxyzine as prescribed.  She is not seeing a therapist.  She denies anxiety, SI/HI.  Neutropenia: Her last WBC was 3.8, 04/2020.  She is not currently following with hematology.  Flu: 04/2020 Tetanus: COVID: Pfizer x2 Shingrix: Never Pap smear: 09/2019 Mammogram: 02/2019 Colon screening: 03/2017 Vision screening: Dentist:  Diet: Exercise:  Review of Systems  Past Medical History:  Diagnosis Date   Arthritis    CAD (coronary artery disease)    s/p stenting x 2   Cancer ([place])    anus cancer   Colon polyp    Depression    Hyperlipidemia    MI (myocardial infarction) ([place])    Thyroid disease    Vitamin D deficiency     Current Outpatient Medications  Medication Sig Dispense Refill   acetaminophen (TYLENOL) 500 MG tablet Take 1,000 mg by mouth every 6 (six) hours as needed.     albuterol (VENTOLIN HFA) 108 (90 Base) MCG/ACT inhaler Inhale 1-2 puffs into the lungs every 6 (six) hours as needed for  wheezing or shortness of breath (cough). 1 each 0   Ascorbic Acid (VITAMIN C) 500 MG CAPS Take 1 capsule by mouth daily.     buPROPion (WELLBUTRIN XL) 150 MG 24 hr tablet Take 1 tablet by mouth once daily 90 tablet 0   Cholecalciferol (VITAMIN D) 10 MCG/ML LIQD Take 2,000 Units by mouth daily.     cyclobenzaprine (FLEXERIL) 10 MG tablet Take 1 tablet (10 mg total) by mouth at bedtime as needed for muscle spasms. 30 tablet 1   fluticasone (FLONASE) 50 MCG/ACT nasal spray Place 1 spray into both nostrils daily as needed.     hydrOXYzine (ATARAX/VISTARIL) 10 MG tablet Take 1 tablet (10 mg total) by mouth daily as needed. 30 tablet 0   levothyroxine (SYNTHROID) 88 MCG tablet TAKE 1 TABLET BY MOUTH ONCE DAILY BEFORE BREAKFAST 90 tablet 0   loperamide (IMODIUM) 2 MG capsule Take 2 mg by mouth as needed for diarrhea or loose stools.     naproxen (NAPROSYN) 500 MG tablet Take 500 mg by mouth 3 (three) times daily as needed.     nitroGLYCERIN (NITROSTAT) 0.4 MG SL tablet Place 1 tablet (0.4 mg total) under the tongue every 5 (five) minutes as needed for chest pain. (Patient not taking: No sig reported) 100 tablet 3   traMADol (ULTRAM) 50 MG tablet Take 1 tablet (50 mg total) by mouth every 12 (twelve) hours as needed for moderate pain (abdominal or rectal pain).  60 tablet 0   venlafaxine XR (EFFEXOR XR) 37.5 MG 24 hr capsule Take 1 capsule (37.5 mg total) by mouth daily with breakfast. 30 capsule 2   vitamin B-12 (CYANOCOBALAMIN) 1000 MCG tablet Take 1,000 mcg by mouth daily.     No current facility-administered medications for this visit.    Allergies  Allergen Reactions   Sulfa Antibiotics Rash   Sulfasalazine Rash   Tape Rash    Family History  Problem Relation Age of Onset   Alzheimer's disease Mother    CAD Father    Alzheimer's disease Maternal Grandmother    Thyroid disease Brother    Hyperlipidemia Brother    Hyperlipidemia Brother    Alcohol abuse Brother    Healthy Daughter     Healthy Son    Heart disease Paternal Uncle    Heart disease Paternal Grandmother    Heart disease Paternal Grandfather    Thyroid disease Brother    Cirrhosis Brother    Healthy Son    Breast cancer Cousin     Social History   Socioeconomic History   Marital status: Married    Spouse name: Not on file   Number of children: Not on file   Years of education: Not on file   Highest education level: Not on file  Occupational History   Not on file  Tobacco Use   Smoking status: Former    Packs/day: 0.75    Years: 30.00    Pack years: 22.50    Types: Cigarettes    Quit date: 01/21/2007    Years since quitting: 13.9   Smokeless tobacco: Never  Vaping Use   Vaping Use: Never used  Substance and Sexual Activity   Alcohol use: No   Drug use: No   Sexual activity: Yes    Birth control/protection: None  Other Topics Concern   Not on file  Social History Narrative   Not on file   Social Determinants of Health   Financial Resource Strain: Low Risk    Difficulty of Paying Living Expenses: Not hard at all  Food Insecurity: No Food Insecurity   Worried About Charity fundraiser in the Last Year: Never true   Ran Out of Food in the Last Year: Never true  Transportation Needs: No Transportation Needs   Lack of Transportation (Medical): No   Lack of Transportation (Non-Medical): No  Physical Activity: Inactive   Days of Exercise per Week: 0 days   Minutes of Exercise per Session: 0 min  Stress: No Stress Concern Present   Feeling of Stress : Not at all  Social Connections: Not on file  Intimate Partner Violence: Not on file     Constitutional: Denies fever, malaise, fatigue, headache or abrupt weight changes.  HEENT: Denies eye pain, eye redness, ear pain, ringing in the ears, wax buildup, runny nose, nasal congestion, bloody nose, or sore throat. Respiratory: Denies difficulty breathing, shortness of breath, cough or sputum production.   Cardiovascular: Denies chest pain,  chest tightness, palpitations or swelling in the hands or feet.  Gastrointestinal: Patient reports fecal incontinence.  Denies abdominal pain, bloating, constipation, diarrhea or blood in the stool.  GU: Denies urgency, frequency, pain with urination, burning sensation, blood in urine, odor or discharge. Musculoskeletal: Patient reports low back pain.  Denies decrease in range of motion, difficulty with gait, muscle pain or joint swelling.  Skin: Denies redness, rashes, lesions or ulcercations.  Neurological: Patient reports hot flashes.  Denies dizziness, difficulty with memory,  difficulty with speech or problems with balance and coordination.  Psych: Patient has a history of depression.  Denies anxiety, SI/HI.  No other specific complaints in a complete review of systems (except as listed in HPI above).     Objective:   Physical Exam   There were no vitals taken for this visit. Wt Readings from Last 3 Encounters:  07/17/20 185 lb 9.6 oz (84.2 kg)  07/16/20 178 lb (80.7 kg)  04/15/20 177 lb 3.2 oz (80.4 kg)    General: Appears their stated age, well developed, well nourished in NAD. Skin: Warm, dry and intact. No rashes, lesions or ulcerations noted. HEENT: Head: normal shape and size; Eyes: sclera white, no icterus, conjunctiva pink, PERRLA and EOMs intact; Ears: Tm's gray and intact, normal light reflex; Nose: mucosa pink and moist, septum midline; Throat/Mouth: Teeth present, mucosa pink and moist, no exudate, lesions or ulcerations noted.  Neck:  Neck supple, trachea midline. No masses, lumps or thyromegaly present.  Cardiovascular: Normal rate and rhythm. S1,S2 noted.  No murmur, rubs or gallops noted. No JVD or BLE edema. No carotid bruits noted. Pulmonary/Chest: Normal effort and positive vesicular breath sounds. No respiratory distress. No wheezes, rales or ronchi noted.  Abdomen: Soft and nontender. Normal bowel sounds. No distention or masses noted. Liver, spleen and kidneys  non palpable. Musculoskeletal: Normal range of motion. No signs of joint swelling. No difficulty with gait.  Neurological: Alert and oriented. Cranial nerves II-XII grossly intact. Coordination normal.  Psychiatric: Mood and affect normal. Behavior is normal. Judgment and thought content normal.    BMET    Component Value Date/Time   NA 142 04/04/2020 0904   K 4.7 04/04/2020 0904   CL 107 04/04/2020 0904   CO2 23 04/04/2020 0904   GLUCOSE 96 04/04/2020 0904   BUN 18 04/04/2020 0904   CREATININE 0.83 04/04/2020 0904   CALCIUM 9.2 04/04/2020 0904   GFRNONAA 79 04/04/2020 0904   GFRAA 91 04/04/2020 0904    Lipid Panel     Component Value Date/Time   CHOL 183 09/07/2019 1001   TRIG 66 09/07/2019 1001   HDL 61 09/07/2019 1001   CHOLHDL 3.0 09/07/2019 1001   LDLCALC 107 (H) 09/07/2019 1001    CBC    Component Value Date/Time   WBC 3.8 (L) 04/15/2020 1209   RBC 3.95 04/15/2020 1209   HGB 13.1 04/15/2020 1209   HCT 39.5 04/15/2020 1209   PLT 180 04/15/2020 1209   MCV 100.0 04/15/2020 1209   MCH 33.2 04/15/2020 1209   MCHC 33.2 04/15/2020 1209   RDW 12.6 04/15/2020 1209   LYMPHSABS 1.0 04/15/2020 1209   MONOABS 0.3 04/15/2020 1209   EOSABS 0.1 04/15/2020 1209   BASOSABS 0.0 04/15/2020 1209    Hgb A1C No results found for: HGBA1C        Assessment & Plan:   Preventative Health Maintenance:  Encouraged her to get a flu shot in fall Tetanus Encouraged her to get her COVID booster Discussed Shingrix vaccine Pap smear UTD Mammogram ordered, she will call to schedule Colon screening UTD Encouraged her to consume a balanced diet and exercise regimen Advised her to see an eye doctor and dentist annually We will check CBC, c-Met, TSH, free T4, lipid, A1c, HIV and hep C today  RTC in 1 year, sooner if needed Webb Silversmith, NP This visit occurred during the SARS-CoV-2 public health emergency.  Safety protocols were in place, including screening questions prior to  the visit, additional  usage of staff PPE, and extensive cleaning of exam room while observing appropriate contact time as indicated for disinfecting solutions.   

## 2020-12-31 ENCOUNTER — Ambulatory Visit: Payer: Medicaid Other | Admitting: Internal Medicine

## 2020-12-31 ENCOUNTER — Encounter: Payer: Self-pay | Admitting: Internal Medicine

## 2020-12-31 NOTE — Telephone Encounter (Signed)
She can't be seen without an insurance card?

## 2021-01-02 ENCOUNTER — Other Ambulatory Visit: Payer: Self-pay | Admitting: Internal Medicine

## 2021-01-02 DIAGNOSIS — E039 Hypothyroidism, unspecified: Secondary | ICD-10-CM

## 2021-01-02 MED ORDER — LEVOTHYROXINE SODIUM 88 MCG PO TABS: ORAL_TABLET | ORAL | 0 refills | Status: DC

## 2021-01-02 NOTE — Telephone Encounter (Unsigned)
Copied from [place] [id number]. Topic: Quick Communication - Rx Refill/Question >> Jan 02, 2021  9:29 AM Tessa Lerner A wrote: Medication: levothyroxine (SYNTHROID) 88 MCG tablet [id number]   Has the patient contacted their pharmacy? Yes.   (Agent: If no, request that the patient contact the pharmacy for the refill.) (Agent: If yes, when and what did the pharmacy advise?)  Preferred Pharmacy (with phone number or street name): [place], [place]  Phone:  [phone] Fax:  [phone]  Agent: Please be advised that RX refills may take up to 3 business days. We ask that you follow-up with your pharmacy.

## 2021-01-02 NOTE — Telephone Encounter (Signed)
Future visit in 6 months

## 2021-02-04 ENCOUNTER — Ambulatory Visit: Payer: Medicaid Other | Admitting: Internal Medicine

## 2021-02-04 ENCOUNTER — Ambulatory Visit: Payer: BC Managed Care – PPO | Admitting: Internal Medicine

## 2021-02-04 ENCOUNTER — Encounter: Payer: Self-pay | Admitting: Internal Medicine

## 2021-02-04 ENCOUNTER — Other Ambulatory Visit: Payer: Self-pay

## 2021-02-04 VITALS — BP 109/69 | HR 84 | Temp 97.8°F | Resp 17 | Ht 64.0 in | Wt 176.8 lb

## 2021-02-04 DIAGNOSIS — R1011 Right upper quadrant pain: Secondary | ICD-10-CM

## 2021-02-04 DIAGNOSIS — Z23 Encounter for immunization: Secondary | ICD-10-CM | POA: Diagnosis not present

## 2021-02-04 DIAGNOSIS — R1084 Generalized abdominal pain: Secondary | ICD-10-CM

## 2021-02-04 DIAGNOSIS — R194 Change in bowel habit: Secondary | ICD-10-CM

## 2021-02-04 DIAGNOSIS — K921 Melena: Secondary | ICD-10-CM

## 2021-02-04 DIAGNOSIS — R102 Pelvic and perineal pain: Secondary | ICD-10-CM | POA: Diagnosis not present

## 2021-02-04 DIAGNOSIS — D709 Neutropenia, unspecified: Secondary | ICD-10-CM | POA: Insufficient documentation

## 2021-02-04 NOTE — Progress Notes (Signed)
Subjective:    Patient ID: Brenda Grimes, female    DOB: 29-Dec-1965, 55 y.o.   MRN: [id number]  HPI  Patient presents the clinic today with complaint of right upper quadrant pain, lower abdominal pain that radiates into her mid back.  She describes the pain as sharp and crampy.  She also reports a change in her bowels to more frequent loose bowel movements that are more mucousy and yellow for the last few years.  She denies nausea, vomiting, reflux, constipation.  She intermittently has blood in her stool after treatment for anal cancer.  She is concerned that she has endometriosis.  She reports she has always had bad periods with extreme pain but just learned to deal with it.  She reports her daughter was recently diagnosed with endometriosis 1 month ago and she thinks she may have this as well.  She is also concerned about having a gallbladder issue.  She had a CT abdomen pelvis 04/2020 and did not show any gallstones or gallbladder wall thickening.  There were no acute reproductive findings.  Review of Systems     Past Medical History:  Diagnosis Date   Arthritis    CAD (coronary artery disease)    s/p stenting x 2   Cancer ([facility])    anus cancer   Colon polyp    Depression    Hyperlipidemia    MI (myocardial infarction) ([place])    Thyroid disease    Vitamin D deficiency     Current Outpatient Medications  Medication Sig Dispense Refill   acetaminophen (TYLENOL) 500 MG tablet Take 1,000 mg by mouth every 6 (six) hours as needed.     albuterol (VENTOLIN HFA) 108 (90 Base) MCG/ACT inhaler Inhale 1-2 puffs into the lungs every 6 (six) hours as needed for wheezing or shortness of breath (cough). 1 each 0   Ascorbic Acid (VITAMIN C) 500 MG CAPS Take 1 capsule by mouth daily.     buPROPion (WELLBUTRIN XL) 150 MG 24 hr tablet Take 1 tablet by mouth once daily 90 tablet 0   Cholecalciferol (VITAMIN D) 10 MCG/ML LIQD Take 2,000 Units by mouth daily.     cyclobenzaprine (FLEXERIL) 10 MG  tablet Take 1 tablet (10 mg total) by mouth at bedtime as needed for muscle spasms. 30 tablet 1   fluticasone (FLONASE) 50 MCG/ACT nasal spray Place 1 spray into both nostrils daily as needed.     hydrOXYzine (ATARAX/VISTARIL) 10 MG tablet Take 1 tablet (10 mg total) by mouth daily as needed. 30 tablet 0   levothyroxine (SYNTHROID) 88 MCG tablet TAKE 1 TABLET BY MOUTH ONCE DAILY BEFORE BREAKFAST 90 tablet 0   loperamide (IMODIUM) 2 MG capsule Take 2 mg by mouth as needed for diarrhea or loose stools.     naproxen (NAPROSYN) 500 MG tablet Take 500 mg by mouth 3 (three) times daily as needed.     nitroGLYCERIN (NITROSTAT) 0.4 MG SL tablet Place 1 tablet (0.4 mg total) under the tongue every 5 (five) minutes as needed for chest pain. (Patient not taking: No sig reported) 100 tablet 3   traMADol (ULTRAM) 50 MG tablet Take 1 tablet (50 mg total) by mouth every 12 (twelve) hours as needed for moderate pain (abdominal or rectal pain). 60 tablet 0   venlafaxine XR (EFFEXOR XR) 37.5 MG 24 hr capsule Take 1 capsule (37.5 mg total) by mouth daily with breakfast. 30 capsule 2   vitamin B-12 (CYANOCOBALAMIN) 1000 MCG tablet Take 1,000 mcg by  mouth daily.     No current facility-administered medications for this visit.    Allergies  Allergen Reactions   Sulfa Antibiotics Rash   Sulfasalazine Rash   Tape Rash    Family History  Problem Relation Age of Onset   Alzheimer's disease Mother    CAD Father    Alzheimer's disease Maternal Grandmother    Thyroid disease Brother    Hyperlipidemia Brother    Hyperlipidemia Brother    Alcohol abuse Brother    Healthy Daughter    Healthy Son    Heart disease Paternal Uncle    Heart disease Paternal Grandmother    Heart disease Paternal Grandfather    Thyroid disease Brother    Cirrhosis Brother    Healthy Son    Breast cancer Cousin     Social History   Socioeconomic History   Marital status: Married    Spouse name: Not on file   Number of  children: Not on file   Years of education: Not on file   Highest education level: Not on file  Occupational History   Not on file  Tobacco Use   Smoking status: Former    Packs/day: 0.75    Years: 30.00    Pack years: 22.50    Types: Cigarettes    Quit date: 01/21/2007    Years since quitting: 14.0   Smokeless tobacco: Never  Vaping Use   Vaping Use: Never used  Substance and Sexual Activity   Alcohol use: No   Drug use: No   Sexual activity: Yes    Birth control/protection: None  Other Topics Concern   Not on file  Social History Narrative   Not on file   Social Determinants of Health   Financial Resource Strain: Low Risk    Difficulty of Paying Living Expenses: Not hard at all  Food Insecurity: No Food Insecurity   Worried About Charity fundraiser in the Last Year: Never true   Ran Out of Food in the Last Year: Never true  Transportation Needs: No Transportation Needs   Lack of Transportation (Medical): No   Lack of Transportation (Non-Medical): No  Physical Activity: Inactive   Days of Exercise per Week: 0 days   Minutes of Exercise per Session: 0 min  Stress: No Stress Concern Present   Feeling of Stress : Not at all  Social Connections: Not on file  Intimate Partner Violence: Not on file     Constitutional: Denies fever, malaise, fatigue, headache or abrupt weight changes.  Respiratory: Denies difficulty breathing, shortness of breath, cough or sputum production.   Cardiovascular: Denies chest pain, chest tightness, palpitations or swelling in the hands or feet.  Gastrointestinal: Patient reports abdominal pain, diarrhea, blood in her stool.  Denies bloating, constipation.  GU: Denies urgency, frequency, pain with urination, burning sensation, blood in urine, odor or discharge. Musculoskeletal: Patient reports intermittent low back pain.  Denies decrease in range of motion, difficulty with gait, muscle pain or joint swelling.  Skin: Denies redness, rashes,  lesions or ulcercations.   No other specific complaints in a complete review of systems (except as listed in HPI above).  Objective:   Physical Exam   BP 109/69 (BP Location: Left Arm, Patient Position: Sitting, Cuff Size: Large)   Pulse 84   Temp 97.8 F (36.6 C) (Temporal)   Resp 17   Ht _0  (1.626 m)   Wt 176 lb 12.8 oz (80.2 kg)   SpO2 98%   BMI  30.35 kg/m  Wt Readings from Last 3 Encounters:  02/04/21 176 lb 12.8 oz (80.2 kg)  07/17/20 185 lb 9.6 oz (84.2 kg)  07/16/20 178 lb (80.7 kg)    General: Appears her stated age, obese, in NAD. Skin: Warm, dry and intact.  Cardiovascular: Normal rate and rhythm. S1,S2 noted.  No murmur, rubs or gallops noted.  Pulmonary/Chest: Normal effort and positive vesicular breath sounds. No respiratory distress. No wheezes, rales or ronchi noted.  Abdomen: Soft and generally tender. Normal bowel sounds. No distention or masses noted.  Musculoskeletal:  No difficulty with gait.  Neurological: Alert and oriented.   BMET    Component Value Date/Time   NA 141 02/04/2021 1556   K 4.3 02/04/2021 1556   CL 108 02/04/2021 1556   CO2 28 02/04/2021 1556   GLUCOSE 88 02/04/2021 1556   BUN 14 02/04/2021 1556   CREATININE 0.75 02/04/2021 1556   CALCIUM 9.0 02/04/2021 1556   GFRNONAA 79 04/04/2020 0904   GFRAA 91 04/04/2020 0904    Lipid Panel     Component Value Date/Time   CHOL 183 09/07/2019 1001   TRIG 66 09/07/2019 1001   HDL 61 09/07/2019 1001   CHOLHDL 3.0 09/07/2019 1001   LDLCALC 107 (H) 09/07/2019 1001    CBC    Component Value Date/Time   WBC 3.3 (L) 02/04/2021 1556   RBC 3.78 (L) 02/04/2021 1556   HGB 12.8 02/04/2021 1556   HCT 37.6 02/04/2021 1556   PLT 192 02/04/2021 1556   MCV 99.5 02/04/2021 1556   MCH 33.9 (H) 02/04/2021 1556   MCHC 34.0 02/04/2021 1556   RDW 12.2 02/04/2021 1556   LYMPHSABS 1.0 04/15/2020 1209   MONOABS 0.3 04/15/2020 1209   EOSABS 0.1 04/15/2020 1209   BASOSABS 0.0 04/15/2020 1209     Hgb A1C No results found for: HGBA1C     Wt Readings from Last 3 Encounters:  07/17/20 185 lb 9.6 oz (84.2 kg)  07/16/20 178 lb (80.7 kg)  04/15/20 177 lb 3.2 oz (80.4 kg)    General: Appears their stated age, well developed, well nourished in NAD. Skin: Warm, dry and intact. No rashes, lesions or ulcerations noted. HEENT: Head: normal shape and size; Eyes: sclera white, no icterus, conjunctiva pink, PERRLA and EOMs intact; Ears: Tm's gray and intact, normal light reflex; Nose: mucosa pink and moist, septum midline; Throat/Mouth: Teeth present, mucosa pink and moist, no exudate, lesions or ulcerations noted.  Neck:  Neck supple, trachea midline. No masses, lumps or thyromegaly present.  Cardiovascular: Normal rate and rhythm. S1,S2 noted.  No murmur, rubs or gallops noted. No JVD or BLE edema. No carotid bruits noted. Pulmonary/Chest: Normal effort and positive vesicular breath sounds. No respiratory distress. No wheezes, rales or ronchi noted.  Abdomen: Soft and nontender. Normal bowel sounds. No distention or masses noted. Liver, spleen and kidneys non palpable. Musculoskeletal: Normal range of motion. No signs of joint swelling. No difficulty with gait.  Neurological: Alert and oriented. Cranial nerves II-XII grossly intact. Coordination normal.  Psychiatric: Mood and affect normal. Behavior is normal. Judgment and thought content normal.   BMET    Component Value Date/Time   NA 142 04/04/2020 0904   K 4.7 04/04/2020 0904   CL 107 04/04/2020 0904   CO2 23 04/04/2020 0904   GLUCOSE 96 04/04/2020 0904   BUN 18 04/04/2020 0904   CREATININE 0.83 04/04/2020 0904   CALCIUM 9.2 04/04/2020 0904   GFRNONAA 79 04/04/2020 0904  GFRAA 91 04/04/2020 0904    Lipid Panel     Component Value Date/Time   CHOL 183 09/07/2019 1001   TRIG 66 09/07/2019 1001   HDL 61 09/07/2019 1001   CHOLHDL 3.0 09/07/2019 1001   LDLCALC 107 (H) 09/07/2019 1001    CBC    Component Value  Date/Time   WBC 3.8 (L) 04/15/2020 1209   RBC 3.95 04/15/2020 1209   HGB 13.1 04/15/2020 1209   HCT 39.5 04/15/2020 1209   PLT 180 04/15/2020 1209   MCV 100.0 04/15/2020 1209   MCH 33.2 04/15/2020 1209   MCHC 33.2 04/15/2020 1209   RDW 12.6 04/15/2020 1209   LYMPHSABS 1.0 04/15/2020 1209   MONOABS 0.3 04/15/2020 1209   EOSABS 0.1 04/15/2020 1209   BASOSABS 0.0 04/15/2020 1209    Hgb A1C No results found for: HGBA1C         Assessment & Plan:   RUQ Abdominal Pain, Bowel Habit Change, Blood in Stool:  Reviewed CT scan with patient We will check CBC, c-Met, amylase and lipase Given history of anal cancer, will refer to GI for further evaluation possible colonoscopy  Pelvic Pain:  Reviewed CT scan with patient Discussed that endometriosis is typically found and explored laparotomy Typically endometriosis stops causing pain and discomfort we are no longer menstruating She would like referral to GYN for further evaluation and treatment  Webb Silversmith, NP This visit occurred during the SARS-CoV-2 public health emergency.  Safety protocols were in place, including screening questions prior to the visit, additional usage of staff PPE, and extensive cleaning of exam room while observing appropriate contact time as indicated for disinfecting solutions.

## 2021-02-04 NOTE — Progress Notes (Deleted)
Subjective:    Patient ID: Brenda Grimes, female    DOB: 06-07-1965, 55 y.o.   MRN: [id number]  HPI  Pt presents to the clinic today with c/o. CT abd/pelvis from 04/2020 did not show any gallstones or gallbladder thicking. Reproductive system was unremarkable.  Review of Systems     Past Medical History:  Diagnosis Date   Arthritis    CAD (coronary artery disease)    s/p stenting x 2   Cancer (HCC)    anus cancer   Colon polyp    Depression    Hyperlipidemia    MI (myocardial infarction) ([place])    Thyroid disease    Vitamin D deficiency     Current Outpatient Medications  Medication Sig Dispense Refill   acetaminophen (TYLENOL) 500 MG tablet Take 1,000 mg by mouth every 6 (six) hours as needed.     albuterol (VENTOLIN HFA) 108 (90 Base) MCG/ACT inhaler Inhale 1-2 puffs into the lungs every 6 (six) hours as needed for wheezing or shortness of breath (cough). 1 each 0   Ascorbic Acid (VITAMIN C) 500 MG CAPS Take 1 capsule by mouth daily.     buPROPion (WELLBUTRIN XL) 150 MG 24 hr tablet Take 1 tablet by mouth once daily 90 tablet 0   Cholecalciferol (VITAMIN D) 10 MCG/ML LIQD Take 2,000 Units by mouth daily.     cyclobenzaprine (FLEXERIL) 10 MG tablet Take 1 tablet (10 mg total) by mouth at bedtime as needed for muscle spasms. 30 tablet 1   fluticasone (FLONASE) 50 MCG/ACT nasal spray Place 1 spray into both nostrils daily as needed.     hydrOXYzine (ATARAX/VISTARIL) 10 MG tablet Take 1 tablet (10 mg total) by mouth daily as needed. 30 tablet 0   levothyroxine (SYNTHROID) 88 MCG tablet TAKE 1 TABLET BY MOUTH ONCE DAILY BEFORE BREAKFAST 90 tablet 0   loperamide (IMODIUM) 2 MG capsule Take 2 mg by mouth as needed for diarrhea or loose stools.     naproxen (NAPROSYN) 500 MG tablet Take 500 mg by mouth 3 (three) times daily as needed.     nitroGLYCERIN (NITROSTAT) 0.4 MG SL tablet Place 1 tablet (0.4 mg total) under the tongue every 5 (five) minutes as needed for chest pain.  (Patient not taking: No sig reported) 100 tablet 3   traMADol (ULTRAM) 50 MG tablet Take 1 tablet (50 mg total) by mouth every 12 (twelve) hours as needed for moderate pain (abdominal or rectal pain). 60 tablet 0   venlafaxine XR (EFFEXOR XR) 37.5 MG 24 hr capsule Take 1 capsule (37.5 mg total) by mouth daily with breakfast. 30 capsule 2   vitamin B-12 (CYANOCOBALAMIN) 1000 MCG tablet Take 1,000 mcg by mouth daily.     No current facility-administered medications for this visit.    Allergies  Allergen Reactions   Sulfa Antibiotics Rash   Sulfasalazine Rash   Tape Rash    Family History  Problem Relation Age of Onset   Alzheimer's disease Mother    CAD Father    Alzheimer's disease Maternal Grandmother    Thyroid disease Brother    Hyperlipidemia Brother    Hyperlipidemia Brother    Alcohol abuse Brother    Healthy Daughter    Healthy Son    Heart disease Paternal Uncle    Heart disease Paternal Grandmother    Heart disease Paternal Grandfather    Thyroid disease Brother    Cirrhosis Brother    Healthy Son    Breast cancer Cousin  Social History   Socioeconomic History   Marital status: Married    Spouse name: Not on file   Number of children: Not on file   Years of education: Not on file   Highest education level: Not on file  Occupational History   Not on file  Tobacco Use   Smoking status: Former    Packs/day: 0.75    Years: 30.00    Pack years: 22.50    Types: Cigarettes    Quit date: 01/21/2007    Years since quitting: 14.0   Smokeless tobacco: Never  Vaping Use   Vaping Use: Never used  Substance and Sexual Activity   Alcohol use: No   Drug use: No   Sexual activity: Yes    Birth control/protection: None  Other Topics Concern   Not on file  Social History Narrative   Not on file   Social Determinants of Health   Financial Resource Strain: Low Risk    Difficulty of Paying Living Expenses: Not hard at all  Food Insecurity: No Food Insecurity    Worried About Charity fundraiser in the Last Year: Never true   [place] in the Last Year: Never true  Transportation Needs: No Transportation Needs   Lack of Transportation (Medical): No   Lack of Transportation (Non-Medical): No  Physical Activity: Inactive   Days of Exercise per Week: 0 days   Minutes of Exercise per Session: 0 min  Stress: No Stress Concern Present   Feeling of Stress : Not at all  Social Connections: Not on file  Intimate Partner Violence: Not on file     Constitutional: Denies fever, malaise, fatigue, headache or abrupt weight changes.  HEENT: Denies eye pain, eye redness, ear pain, ringing in the ears, wax buildup, runny nose, nasal congestion, bloody nose, or sore throat. Respiratory: Denies difficulty breathing, shortness of breath, cough or sputum production.   Cardiovascular: Denies chest pain, chest tightness, palpitations or swelling in the hands or feet.  Gastrointestinal: Denies abdominal pain, bloating, constipation, diarrhea or blood in the stool.  GU: Denies urgency, frequency, pain with urination, burning sensation, blood in urine, odor or discharge. Musculoskeletal: Denies decrease in range of motion, difficulty with gait, muscle pain or joint pain and swelling.  Skin: Denies redness, rashes, lesions or ulcercations.  Neurological: Denies dizziness, difficulty with memory, difficulty with speech or problems with balance and coordination.  Psych: Denies anxiety, depression, SI/HI.  No other specific complaints in a complete review of systems (except as listed in HPI above).  Objective:   Physical Exam  There were no vitals taken for this visit. Wt Readings from Last 3 Encounters:  07/17/20 185 lb 9.6 oz (84.2 kg)  07/16/20 178 lb (80.7 kg)  04/15/20 177 lb 3.2 oz (80.4 kg)    General: Appears their stated age, well developed, well nourished in NAD. Skin: Warm, dry and intact. No rashes, lesions or ulcerations noted. HEENT: Head:  normal shape and size; Eyes: sclera white, no icterus, conjunctiva pink, PERRLA and EOMs intact; Ears: Tm's gray and intact, normal light reflex; Nose: mucosa pink and moist, septum midline; Throat/Mouth: Teeth present, mucosa pink and moist, no exudate, lesions or ulcerations noted.  Neck:  Neck supple, trachea midline. No masses, lumps or thyromegaly present.  Cardiovascular: Normal rate and rhythm. S1,S2 noted.  No murmur, rubs or gallops noted. No JVD or BLE edema. No carotid bruits noted. Pulmonary/Chest: Normal effort and positive vesicular breath sounds. No respiratory distress. No  wheezes, rales or ronchi noted.  Abdomen: Soft and nontender. Normal bowel sounds. No distention or masses noted. Liver, spleen and kidneys non palpable. Musculoskeletal: Normal range of motion. No signs of joint swelling. No difficulty with gait.  Neurological: Alert and oriented. Cranial nerves II-XII grossly intact. Coordination normal.  Psychiatric: Mood and affect normal. Behavior is normal. Judgment and thought content normal.     BMET    Component Value Date/Time   NA 142 04/04/2020 0904   K 4.7 04/04/2020 0904   CL 107 04/04/2020 0904   CO2 23 04/04/2020 0904   GLUCOSE 96 04/04/2020 0904   BUN 18 04/04/2020 0904   CREATININE 0.83 04/04/2020 0904   CALCIUM 9.2 04/04/2020 0904   GFRNONAA 79 04/04/2020 0904   GFRAA 91 04/04/2020 0904    Lipid Panel     Component Value Date/Time   CHOL 183 09/07/2019 1001   TRIG 66 09/07/2019 1001   HDL 61 09/07/2019 1001   CHOLHDL 3.0 09/07/2019 1001   LDLCALC 107 (H) 09/07/2019 1001    CBC    Component Value Date/Time   WBC 3.8 (L) 04/15/2020 1209   RBC 3.95 04/15/2020 1209   HGB 13.1 04/15/2020 1209   HCT 39.5 04/15/2020 1209   PLT 180 04/15/2020 1209   MCV 100.0 04/15/2020 1209   MCH 33.2 04/15/2020 1209   MCHC 33.2 04/15/2020 1209   RDW 12.6 04/15/2020 1209   LYMPHSABS 1.0 04/15/2020 1209   MONOABS 0.3 04/15/2020 1209   EOSABS 0.1  04/15/2020 1209   BASOSABS 0.0 04/15/2020 1209    Hgb A1C No results found for: HGBA1C          Assessment & Plan:   Webb Silversmith, NP This visit occurred during the SARS-CoV-2 public health emergency.  Safety protocols were in place, including screening questions prior to the visit, additional usage of staff PPE, and extensive cleaning of exam room while observing appropriate contact time as indicated for disinfecting solutions.

## 2021-02-05 ENCOUNTER — Encounter: Payer: Self-pay | Admitting: *Deleted

## 2021-02-05 ENCOUNTER — Other Ambulatory Visit: Payer: Self-pay | Admitting: Internal Medicine

## 2021-02-05 ENCOUNTER — Telehealth: Payer: Self-pay

## 2021-02-05 DIAGNOSIS — G894 Chronic pain syndrome: Secondary | ICD-10-CM

## 2021-02-05 LAB — CBC
HCT: 37.6 % (ref 35.0–45.0)
Hemoglobin: 12.8 g/dL (ref 11.7–15.5)
MCH: 33.9 pg — ABNORMAL HIGH (ref 27.0–33.0)
MCHC: 34 g/dL (ref 32.0–36.0)
MCV: 99.5 fL (ref 80.0–100.0)
MPV: 9.7 fL (ref 7.5–12.5)
Platelets: 192 10*3/uL (ref 140–400)
RBC: 3.78 10*6/uL — ABNORMAL LOW (ref 3.80–5.10)
RDW: 12.2 % (ref 11.0–15.0)
WBC: 3.3 10*3/uL — ABNORMAL LOW (ref 3.8–10.8)

## 2021-02-05 LAB — COMPLETE METABOLIC PANEL WITH GFR
AG Ratio: 2.9 (calc) — ABNORMAL HIGH (ref 1.0–2.5)
ALT: 12 U/L (ref 6–29)
AST: 16 U/L (ref 10–35)
Albumin: 4.4 g/dL (ref 3.6–5.1)
Alkaline phosphatase (APISO): 78 U/L (ref 37–153)
BUN: 14 mg/dL (ref 7–25)
CO2: 28 mmol/L (ref 20–32)
Calcium: 9 mg/dL (ref 8.6–10.4)
Chloride: 108 mmol/L (ref 98–110)
Creat: 0.75 mg/dL (ref 0.50–1.03)
Globulin: 1.5 g/dL (calc) — ABNORMAL LOW (ref 1.9–3.7)
Glucose, Bld: 88 mg/dL (ref 65–139)
Potassium: 4.3 mmol/L (ref 3.5–5.3)
Sodium: 141 mmol/L (ref 135–146)
Total Bilirubin: 0.4 mg/dL (ref 0.2–1.2)
Total Protein: 5.9 g/dL — ABNORMAL LOW (ref 6.1–8.1)
eGFR: 93 mL/min/{1.73_m2} (ref >=60)

## 2021-02-05 LAB — AMYLASE: Amylase: 46 U/L (ref 21–101)

## 2021-02-05 LAB — LIPASE: Lipase: 29 U/L (ref 7–60)

## 2021-02-05 MED ORDER — TRAMADOL HCL 50 MG PO TABS: 50.0000 mg | ORAL_TABLET | Freq: Two times a day (BID) | ORAL | 0 refills | Status: DC | PRN

## 2021-02-05 NOTE — Telephone Encounter (Signed)
Brenda Grimes medical referring for pelvic pain, concern for endometriosis. Called and left voicemail for patient to call back to be scheduled.

## 2021-02-06 ENCOUNTER — Encounter: Payer: Self-pay | Admitting: Internal Medicine

## 2021-02-06 NOTE — Patient Instructions (Signed)
Endometriosis  Endometriosis is a condition in which a tissue similar to the endometrium grows in places outside the uterus. The endometrium is a tissue that forms the lining of the uterus. This tissue can grow in the organs that create the eggs (ovaries), the tubes that carry the eggs to the uterus (fallopian tubes), the vagina, and the bowel. This tissue most often grows on the ovaries and inner lining of the pelvic cavity (peritoneum). When the uterus sheds the endometrium every menstrual cycle, there is bleeding wherever these types of tissue are located. This can cause pain because blood is irritating to tissues that are not normally exposed to it. Endometriosis canalso make it harder for a woman to get pregnant. What are the causes? The cause of this condition is not known. What increases the risk? The following factors may make you more likely to develop this condition: Having a family history of endometriosis. Having never given birth. Starting your period at 10 years of age or younger. What are the signs or symptoms? Often, there are no symptoms of this condition. If you do have symptoms, they may: Vary depending on where the abnormal tissue is growing. Occur during your menstrual period (most often) or at the middle of your cycle. Come and go. You may have no symptoms during some months. Stop when you no longer have your monthly periods (menopause). Symptoms may include: Pain in the area between your hip bones (pelvis). Heavier bleeding during periods. Menstrual periods that happen more than once a month. Pain during sex. Pain in the back or abdomen. Painful bowel movements. Not being able to get pregnant. How is this diagnosed? This condition is diagnosed based on your symptoms and a physical exam. You may have tests, such as: Blood tests and urine tests to help rule out other causes. Ultrasound to look for tissues that are not normal. This is often done over your skin. It is  sometimes done through the vagina (transvaginal). X-ray of the lower bowel (barium enema). CT scan. MRI. To confirm the diagnosis, your health care provider may use a device with a small camera to check tissue inside your abdomen (laparoscopy). Abnormal tissue may be removed and checked in a lab (biopsy). How is this treated? There is no cure for this condition. Treatment focuses on controlling your symptoms. The type of treatment also depends on whether you want to become pregnant in the future. This condition may be treated with: Medicines. These may include: Medicines to relieve pain, including NSAIDs such as ibuprofen. Hormone therapy. This uses artificial hormones to slow the growth of the abnormal tissue. This may include hormonal birth control, such as pills. Surgery to remove the abnormal tissue. During surgery: Tissue may be removed using a laparoscope and a laser (laparoscopic laser treatment). The fallopian tubes, uterus, and ovaries may be removed (hysterectomy). This is done in very severe cases. Follow these instructions at home: Get regular exercise. Limit alcohol use. Eat a balanced diet. Avoid caffeine. Take over-the-counter and prescription medicines only as told by your health care provider. Keep all follow-up visits as told by your health care provider. This is important. Where to find more information American College of Obstetricians and Gynecologists: https://www.acog.org/ Office on Women's Health: https://www.womenshealth.gov/ Contact a health care provider if: You are having new pain or trouble controlling pain. You have problems getting pregnant. You have a fever. Get help right away if you have: Severe pain that does not get better with medicine. Severe nausea and vomiting, or   if you cannot eat or drink without vomiting. Pain that affects your abdomen only on the lower, right side. Pain in your abdomen that gets worse. Swelling in your abdomen. Blood in  your stool (feces). Summary Endometriosis is a condition in which a tissue similar to the endometrium grows in places outside the uterus. The endometrium is a tissue that forms the lining of the uterus. The cause of this condition is not known. This condition may be treated with medicines to relieve pain, hormone therapy, or surgery. If you have this condition, get regular exercise, limit alcohol use, and avoid caffeine. Get help right away if you have severe pain that does not get better with medicine, or if you have severe nausea and vomiting or blood in your stool. This information is not intended to replace advice given to you by your health care provider. Make sure you discuss any questions you have with your healthcare provider. Document Revised: 06/07/2019 Document Reviewed: 06/07/2019 Elsevier Patient Education  2021 Elsevier Inc.  

## 2021-02-06 NOTE — Telephone Encounter (Signed)
Called and left voicemail for patient to call back to be scheduled. 

## 2021-02-07 NOTE — Telephone Encounter (Signed)
Called and left voicemail for patient to call back to be scheduled. 

## 2021-02-10 ENCOUNTER — Other Ambulatory Visit: Payer: Self-pay | Admitting: Family Medicine

## 2021-02-10 DIAGNOSIS — U071 COVID-19: Secondary | ICD-10-CM

## 2021-02-10 NOTE — Telephone Encounter (Signed)
Voicemail is full unable to leave message. Contacting referring provider due to multiple attempts to reach patient have been unsuccessful.

## 2021-02-20 ENCOUNTER — Other Ambulatory Visit: Payer: Self-pay | Admitting: Internal Medicine

## 2021-02-20 DIAGNOSIS — F339 Major depressive disorder, recurrent, unspecified: Secondary | ICD-10-CM

## 2021-02-21 NOTE — Telephone Encounter (Signed)
Requested Prescriptions  Pending Prescriptions Disp Refills  . buPROPion (WELLBUTRIN XL) 150 MG 24 hr tablet [Pharmacy Med Name: buPROPion HCl ER (XL) 150 MG Oral Tablet Extended Release 24 Hour] 90 tablet 0    Sig: Take 1 tablet by mouth once daily     Psychiatry: Antidepressants - bupropion Passed - 02/20/2021  5:32 PM      Passed - Completed PHQ-2 or PHQ-9 in the last 360 days      Passed - Last BP in normal range    BP Readings from Last 1 Encounters:  02/04/21 109/69         Passed - Valid encounter within last 6 months    Recent Outpatient Visits          2 weeks ago Bowel habit changes   [place], [place], NP   5 months ago Hot flashes due to menopause   [place], Coralie Keens, NP   6 months ago Depression, unspecified depression type   [place], [place]   7 months ago Anal cancer [facility])   [facility] Olin Hauser, DO   9 months ago COVID-19 virus infection   [place], Devonne Doughty, DO      Future Appointments            In 5 months [facility], [facility]

## 2021-02-24 ENCOUNTER — Other Ambulatory Visit: Payer: Self-pay

## 2021-02-24 ENCOUNTER — Encounter: Payer: Self-pay | Admitting: Obstetrics and Gynecology

## 2021-02-24 ENCOUNTER — Other Ambulatory Visit (HOSPITAL_COMMUNITY)
Admission: RE | Admit: 2021-02-24 | Discharge: 2021-02-24 | Disposition: A | Discharge status: Home / Self Care | Payer: BC Managed Care – PPO | Source: Ambulatory Visit | Attending: Obstetrics and Gynecology | Admitting: Obstetrics and Gynecology

## 2021-02-24 ENCOUNTER — Ambulatory Visit (INDEPENDENT_AMBULATORY_CARE_PROVIDER_SITE_OTHER): Payer: BC Managed Care – PPO | Admitting: Obstetrics and Gynecology

## 2021-02-24 VITALS — BP 112/68 | Ht 63.0 in | Wt 175.0 lb

## 2021-02-24 DIAGNOSIS — N895 Stricture and atresia of vagina: Secondary | ICD-10-CM

## 2021-02-24 DIAGNOSIS — Z124 Encounter for screening for malignant neoplasm of cervix: Secondary | ICD-10-CM | POA: Insufficient documentation

## 2021-02-24 DIAGNOSIS — Z85048 Personal history of other malignant neoplasm of rectum, rectosigmoid junction, and anus: Secondary | ICD-10-CM

## 2021-02-24 DIAGNOSIS — N950 Postmenopausal bleeding: Secondary | ICD-10-CM | POA: Diagnosis not present

## 2021-02-24 NOTE — Progress Notes (Signed)
[facility] Office Visit   Chief Complaint:  Chief Complaint  Patient presents with   Pelvic Pain    Referral for pelvic pain     History of Present Illness: 55 y.o. [phone] with a history of T2N1 rectal cancer status post chemoradiation (54 Gy with micomycin C and 5FU last course 09/21/2012) who presents with pelvic pain.  Prior examination at [facility] showed stricturing of the vagina secondary to radiation related changes.  She was previously started on vaginal dilator but has discontinued these.  Never followed up at [facility].  She is referred secondary to increased pain.  Most recent imaging available is CT A/P 04/29/2020 with no gyn pathology visualized.  She underwent gyn ultrasound 09/12/2019 for postmenopausal bleeding, ovaries not visualized, 75mm endometrial stripe, vaginal imaging was limited.   She has not had bleeding since then.   .    Review of Systems: Review of Systems  Constitutional: Negative.   Gastrointestinal:  Positive for abdominal pain.  Genitourinary: Negative.   Skin: Negative.     Past Medical History:  Past Medical History:  Diagnosis Date   Arthritis    CAD (coronary artery disease)    s/p stenting x 2   Cancer ([place])    anus cancer   Colon polyp    Depression    Hyperlipidemia    MI (myocardial infarction) ([place])    Thyroid disease    Vitamin D deficiency     Past Surgical History:  Past Surgical History:  Procedure Laterality Date   CARDIAC SURGERY     CORONARY ANGIOPLASTY WITH STENT PLACEMENT     TUBAL LIGATION      Gynecologic History: No LMP recorded. Patient is postmenopausal.  Obstetric History: [id number]  Family History:  Family History  Problem Relation Age of Onset   Alzheimer's disease Mother    CAD Father    Alzheimer's disease Maternal Grandmother    Thyroid disease Brother    Hyperlipidemia Brother    Hyperlipidemia Brother    Alcohol abuse Brother    Healthy Daughter    Healthy Son     Heart disease Paternal Uncle    Heart disease Paternal Grandmother    Heart disease Paternal Grandfather    Thyroid disease Brother    Cirrhosis Brother    Healthy Son    Breast cancer Cousin     Social History:  Social History   Socioeconomic History   Marital status: Married    Spouse name: Not on file   Number of children: Not on file   Years of education: Not on file   Highest education level: Not on file  Occupational History   Not on file  Tobacco Use   Smoking status: Former    Packs/day: 0.75    Years: 30.00    Pack years: 22.50    Types: Cigarettes    Quit date: 01/21/2007    Years since quitting: 14.1   Smokeless tobacco: Never  Vaping Use   Vaping Use: Never used  Substance and Sexual Activity   Alcohol use: No   Drug use: No   Sexual activity: Yes    Birth control/protection: None  Other Topics Concern   Not on file  Social History Narrative   Not on file   Social Determinants of Health   Financial Resource Strain: Low Risk    Difficulty of Paying Living Expenses: Not hard at all  Food Insecurity: No Food Insecurity   Worried  About Running Out of Food in the Last Year: Never true   Ran Out of Food in the Last Year: Never true  Transportation Needs: No Transportation Needs   Lack of Transportation (Medical): No   Lack of Transportation (Non-Medical): No  Physical Activity: Inactive   Days of Exercise per Week: 0 days   Minutes of Exercise per Session: 0 min  Stress: No Stress Concern Present   Feeling of Stress : Not at all  Social Connections: Not on file  Intimate Partner Violence: Not on file    Allergies:  Allergies  Allergen Reactions   Sulfa Antibiotics Rash   Sulfasalazine Rash   Tape Rash    Medications: Prior to Admission medications   Medication Sig Start Date End Date Taking? Authorizing Provider  buPROPion (WELLBUTRIN XL) 150 MG 24 hr tablet Take 1 tablet by mouth once daily 02/21/21  Yes Baity, Coralie Keens, NP  levothyroxine  (SYNTHROID) 88 MCG tablet TAKE 1 TABLET BY MOUTH ONCE DAILY BEFORE BREAKFAST 01/02/21  Yes Baity, Coralie Keens, NP  loperamide (IMODIUM) 2 MG capsule Take 2 mg by mouth as needed for diarrhea or loose stools.   Yes [provider]  naproxen (NAPROSYN) 500 MG tablet Take 500 mg by mouth 3 (three) times daily as needed. 01/21/17  Yes [provider]  traMADol (ULTRAM) 50 MG tablet Take 1 tablet (50 mg total) by mouth every 12 (twelve) hours as needed for moderate pain (abdominal or rectal pain). 02/05/21  Yes Baity, Coralie Keens, NP  vitamin B-12 (CYANOCOBALAMIN) 1000 MCG tablet Take 1,000 mcg by mouth daily.   Yes [provider]  acetaminophen (TYLENOL) 500 MG tablet Take 1,000 mg by mouth every 6 (six) hours as needed.    [provider]  nitroGLYCERIN (NITROSTAT) 0.4 MG SL tablet Place 1 tablet (0.4 mg total) under the tongue every 5 (five) minutes as needed for chest pain. Patient not taking: No sig reported 03/23/17 03/23/18  Earleen Newport, MD  venlafaxine XR (EFFEXOR XR) 37.5 MG 24 hr capsule Take 1 capsule (37.5 mg total) by mouth daily with breakfast. 12/09/20   Jearld Fenton, NP    Physical Exam Vitals:  Vitals:   02/24/21 0944  BP: 112/68   No LMP recorded. Patient is postmenopausal.  General: NAD HEENT: normocephalic, anicteric Pulmonary: No increased work of breathing Genitourinary:  External: Normal external female genitalia.  Normal urethral meatus, normal  Bartholin's and Skene's glands.    Vagina: The vagina is normal in caliber, however only approximately 3.5cm in depth.  There is a small area of dimpling noted on which may represent a area of stenosis with additional vaginal above this point or the cervical os.   Uterus: Non-enlarged, mobile, normal contour.  No CMT  Adnexa: ovaries non-enlarged, no adnexal masses  Rectal: deferred  Lymphatic: no evidence of inguinal lymphadenopathy Extremities: no edema, erythema, or  tenderness Neurologic: Grossly intact Psychiatric: mood appropriate, affect full  Female chaperone present for pelvic  portions of the physical exam  Assessment: 55 y.o. [phone] with vaginal shortening vs stenosis secondary to prior radiation therapy  Plan: Problem List Items Addressed This Visit       Genitourinary   Vaginal stenosis   Relevant Orders   Ambulatory referral to Gynecology   MR PELVIS W WO CONTRAST   Ambulatory referral to Urogynecology   Other Visit Diagnoses     History of rectal cancer    -  Primary   Relevant Orders  MR PELVIS W WO CONTRAST   Ambulatory referral to Urogynecology   Screening for malignant neoplasm of cervix       Relevant Orders   Cytology - PAP (Completed)   Postmenopausal vaginal bleeding       Relevant Orders   MR PELVIS W WO CONTRAST      1) Referral to [facility] uro[facility]ne[facility] - discussed dilators vs reconstruction.  I have ordered an MRI of the pelvis.  Prior exam seemed to reference stricturing of the vagina, the caliber is normal on exam today but the length is around 3.5cm on exam today.  I can also not exclude potential a worsening stricture with a portion of the vaginal vault no completely closed off superior to what I assumed to be the cervical os - MRI pelvis   2) Healthcare maintenance - pap collected  3) .A total of 20 minutes were spent in face-to-face contact with the patient during this encounter with over half of that time devoted to counseling and coordination of care. Prior imaging and labs reviewed.  4) Return if symptoms worsen or fail to improve, will call with imaging results.      Malachy Mood, MD, [facility], [employer] [facility] [facility] 02/24/2021, 10:03 AM

## 2021-02-25 ENCOUNTER — Telehealth: Payer: Self-pay

## 2021-02-25 NOTE — Telephone Encounter (Signed)
Pt calling triage to see if what was done yesterday would make her be in pain today. I advised pt all the Dr did Wilburn Mylar was a pap. So No that should not make her be in more pain, Pt aware and waiting for her referral and MRI.

## 2021-02-26 LAB — CYTOLOGY - PAP
Comment: NEGATIVE
Diagnosis: NEGATIVE
High risk HPV: NEGATIVE

## 2021-02-27 ENCOUNTER — Telehealth: Payer: Self-pay

## 2021-02-27 NOTE — Telephone Encounter (Signed)
Pt calling; hasn't heard anything from anyone re test results.  [phone]  Pt aware of neg pap smear; [facility] will be calling her to schedule referral; [facility] will call her with MRI appt.

## 2021-02-28 ENCOUNTER — Other Ambulatory Visit: Payer: Self-pay | Admitting: Internal Medicine

## 2021-02-28 ENCOUNTER — Telehealth: Payer: Self-pay | Admitting: Internal Medicine

## 2021-02-28 DIAGNOSIS — G894 Chronic pain syndrome: Secondary | ICD-10-CM

## 2021-02-28 DIAGNOSIS — F339 Major depressive disorder, recurrent, unspecified: Secondary | ICD-10-CM

## 2021-02-28 DIAGNOSIS — E039 Hypothyroidism, unspecified: Secondary | ICD-10-CM

## 2021-02-28 NOTE — Telephone Encounter (Signed)
Requested medication (s) are due for refill today No. Last ordered  02/05/21  #60 tabs no refills.  Requested medication (s) are on the active medication list Yes  Future visit scheduled-No.. LOV 02/04/21  Note to clinic-Medication is not delegated.    Requested Prescriptions  Pending Prescriptions Disp Refills   traMADol (ULTRAM) 50 MG tablet [Pharmacy Med Name: traMADol HCl 50 MG Oral Tablet] 60 tablet 0    Sig: TAKE 1 TABLET BY MOUTH EVERY 12 HOURS AS NEEDED FOR MODERATE PAIN (  ABDOMINAL  OR  RECTAL  PAIN  )     Not Delegated - Analgesics:  Opioid Agonists Failed - 02/28/2021  9:35 AM      Failed - This refill cannot be delegated      Failed - Urine Drug Screen completed in last 360 days      Passed - Valid encounter within last 6 months    Recent Outpatient Visits           3 weeks ago Bowel habit changes   [facility] [place], [place], NP   5 months ago Hot flashes due to menopause   [facility] [place], Coralie Keens, NP   6 months ago Depression, unspecified depression type   [place], [place]   7 months ago Anal cancer [facility])   [facility] Olin Hauser, DO   9 months ago COVID-19 virus infection   [place], DO       Future Appointments             In 4 months [place], [place] Prescriptions Disp Refills   levothyroxine (SYNTHROID) 88 MCG tablet [Pharmacy Med Name: Levothyroxine Sodium 88 MCG Oral Tablet] 90 tablet 0    Sig: TAKE 1 TABLET BY MOUTH ONCE DAILY BEFORE BREAKFAST     Endocrinology:  Hypothyroid Agents Failed - 02/28/2021  9:35 AM      Failed - TSH needs to be rechecked within 3 months after an abnormal result. Refill until TSH is due.      Passed - TSH in normal range and within 360 days    TSH  Date Value Ref Range Status  04/04/2020 1.28 0.40 - 4.50 mIU/L Final           Passed - Valid encounter within last 12 months    Recent Outpatient Visits           3 weeks ago Bowel habit changes   [facility] [place], [place], NP   5 months ago Hot flashes due to menopause   [facility] [place], Coralie Keens, NP   6 months ago Depression, unspecified depression type   [place], [place]   7 months ago Anal cancer [facility])   [facility] Olin Hauser, DO   9 months ago COVID-19 virus infection   [place], DO       Future Appointments             In 4 months [facility]              buPROPion (WELLBUTRIN XL) 150 MG 24 hr tablet [Pharmacy Med Name: buPROPion HCl ER (XL) 150 MG Oral Tablet Extended Release 24 Hour] 90  tablet 0    Sig: Take 1 tablet by mouth once daily     Psychiatry: Antidepressants - bupropion Passed - 02/28/2021  9:35 AM      Passed - Completed PHQ-2 or PHQ-9 in the last 360 days      Passed - Last BP in normal range    BP Readings from Last 1 Encounters:  02/24/21 112/68          Passed - Valid encounter within last 6 months    Recent Outpatient Visits           3 weeks ago Bowel habit changes   [place], [place], NP   5 months ago Hot flashes due to menopause   [place], Coralie Keens, NP   6 months ago Depression, unspecified depression type   [place], [place]   7 months ago Anal cancer [facility])   [facility] Olin Hauser, DO   9 months ago COVID-19 virus infection   [place], DO       Future Appointments             In 4 months [facility], [facility]

## 2021-02-28 NOTE — Telephone Encounter (Signed)
Levothyroxine last ordered 01/02/21  #90 tabs no refills. This should last the patient until 04/03/21-requesting too early-refused. Most recent TSH  Tramadol last ordered 02/05/21 #60 no refills-non delegated-routing to provider for review. Bupropion last ordered 02/21/21 #90 no refills-requesting too early-refused.  LOV  02/04/21  No future OV scheduled at this time.

## 2021-02-28 NOTE — Telephone Encounter (Signed)
Pt's husband called asking if his wife can get an early refill on the Tramadol.  She has been having to take more than 2 tablets a day because of pain  [facility]  [place]

## 2021-03-01 ENCOUNTER — Other Ambulatory Visit: Payer: Self-pay | Admitting: Internal Medicine

## 2021-03-01 DIAGNOSIS — E039 Hypothyroidism, unspecified: Secondary | ICD-10-CM

## 2021-03-01 NOTE — Telephone Encounter (Signed)
last RF 01/02/21 #90 too soon

## 2021-03-03 NOTE — Telephone Encounter (Signed)
I called the patient and informed her of Rollene Fare recommendation. She declined the referral to pain management for now. 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

## 2021-03-03 NOTE — Telephone Encounter (Signed)
Will not refill early. If she is having increased pain and taking more than prescribed, then she needs to see a pain management specialist.

## 2021-03-07 ENCOUNTER — Telehealth: Payer: Self-pay | Admitting: Internal Medicine

## 2021-03-07 ENCOUNTER — Other Ambulatory Visit: Payer: Self-pay

## 2021-03-07 DIAGNOSIS — E039 Hypothyroidism, unspecified: Secondary | ICD-10-CM

## 2021-03-07 MED ORDER — LEVOTHYROXINE SODIUM 88 MCG PO TABS: ORAL_TABLET | ORAL | 0 refills | Status: DC

## 2021-03-07 NOTE — Telephone Encounter (Signed)
Mickel Baas, from pharmacy, calling and is requesting to have permission to switch manufacturer for pts levothyroxine prescription. Please advise.         [place]  [place] [address]  Phone: [phone] Fax: [phone]  Hours: Not open 24 hours

## 2021-03-12 ENCOUNTER — Other Ambulatory Visit: Payer: Self-pay | Admitting: Internal Medicine

## 2021-03-12 DIAGNOSIS — F339 Major depressive disorder, recurrent, unspecified: Secondary | ICD-10-CM

## 2021-03-13 NOTE — Telephone Encounter (Signed)
This was refused because it has been requested too early.  Filled 02/21/2021 #90, 0 refills.  Requested Prescriptions  Pending Prescriptions Disp Refills  . buPROPion (WELLBUTRIN XL) 150 MG 24 hr tablet [Pharmacy Med Name: buPROPion HCl ER (XL) 150 MG Oral Tablet Extended Release 24 Hour] 90 tablet 0    Sig: Take 1 tablet by mouth once daily     Psychiatry: Antidepressants - bupropion Passed - 03/12/2021 11:12 PM      Passed - Completed PHQ-2 or PHQ-9 in the last 360 days      Passed - Last BP in normal range    BP Readings from Last 1 Encounters:  02/24/21 112/68         Passed - Valid encounter within last 6 months    Recent Outpatient Visits          1 month ago Bowel habit changes   [place], [place], NP   6 months ago Hot flashes due to menopause   [place], Coralie Keens, NP   7 months ago Depression, unspecified depression type   [place], [place]   7 months ago Anal cancer [facility])   [facility] Olin Hauser, DO   10 months ago COVID-19 virus infection   [place], DO      Future Appointments            In 4 months [facility], [facility]

## 2021-03-18 ENCOUNTER — Other Ambulatory Visit: Payer: Self-pay

## 2021-03-18 ENCOUNTER — Ambulatory Visit
Admission: RE | Admit: 2021-03-18 | Discharge: 2021-03-18 | Disposition: A | Discharge status: Home / Self Care | Payer: BC Managed Care – PPO | Source: Ambulatory Visit | Attending: Obstetrics and Gynecology | Admitting: Obstetrics and Gynecology

## 2021-03-18 ENCOUNTER — Other Ambulatory Visit: Payer: Self-pay | Admitting: Internal Medicine

## 2021-03-18 DIAGNOSIS — D252 Subserosal leiomyoma of uterus: Secondary | ICD-10-CM | POA: Diagnosis not present

## 2021-03-18 DIAGNOSIS — N888 Other specified noninflammatory disorders of cervix uteri: Secondary | ICD-10-CM | POA: Diagnosis not present

## 2021-03-18 DIAGNOSIS — R1030 Lower abdominal pain, unspecified: Secondary | ICD-10-CM | POA: Diagnosis not present

## 2021-03-18 DIAGNOSIS — N950 Postmenopausal bleeding: Secondary | ICD-10-CM | POA: Diagnosis not present

## 2021-03-18 DIAGNOSIS — Z85048 Personal history of other malignant neoplasm of rectum, rectosigmoid junction, and anus: Secondary | ICD-10-CM | POA: Insufficient documentation

## 2021-03-18 DIAGNOSIS — N895 Stricture and atresia of vagina: Secondary | ICD-10-CM | POA: Diagnosis not present

## 2021-03-18 DIAGNOSIS — G894 Chronic pain syndrome: Secondary | ICD-10-CM

## 2021-03-18 MED ORDER — GADOBUTROL 1 MMOL/ML IV SOLN
7.0000 mL | Freq: Once | INTRAVENOUS | Status: AC | PRN
  Administered 2021-03-18: 7 mL via INTRAVENOUS

## 2021-03-20 ENCOUNTER — Ambulatory Visit (INDEPENDENT_AMBULATORY_CARE_PROVIDER_SITE_OTHER): Payer: BC Managed Care – PPO | Admitting: Gastroenterology

## 2021-03-20 ENCOUNTER — Encounter: Payer: Self-pay | Admitting: Internal Medicine

## 2021-03-20 ENCOUNTER — Encounter: Payer: Self-pay | Admitting: Gastroenterology

## 2021-03-20 VITALS — BP 106/58 | HR 97 | Temp 97.5°F | Ht 63.0 in | Wt 175.2 lb

## 2021-03-20 DIAGNOSIS — G894 Chronic pain syndrome: Secondary | ICD-10-CM

## 2021-03-20 DIAGNOSIS — K529 Noninfective gastroenteritis and colitis, unspecified: Secondary | ICD-10-CM | POA: Diagnosis not present

## 2021-03-20 MED ORDER — TRAMADOL HCL 50 MG PO TABS: 50.0000 mg | ORAL_TABLET | Freq: Two times a day (BID) | ORAL | 0 refills | Status: DC | PRN

## 2021-03-20 MED ORDER — PEG 3350-KCL-NA BICARB-NACL 420 G PO SOLR: 4000.0000 mL | Freq: Once | ORAL | 0 refills | Status: AC

## 2021-03-20 NOTE — Progress Notes (Signed)
Cephas Darby, MD [address]  [place]  [place], [place] [address]  Main: [phone]  Fax: [phone]    Gastroenterology Consultation  Referring Provider:     Jearld Fenton, NP Primary Care Physician:  Jearld Fenton, NP Primary Gastroenterologist:  Dr. Cephas Darby Reason for Consultation:     Chronic diarrhea        HPI:   Brenda Grimes is a 55 y.o. female referred by Dr. Jearld Fenton, NP  for consultation & management of chronic diarrhea.  Patient has history of anal cancer s/p chemo XRT, in remission, history of coronary artery disease is seen in consultation for several years history of nonbloody loose bowel movements, particularly postprandial associated with mucus and often notices that her stools are greasy particularly worse after a fatty meal.  She does report that she intermittently has formed bowel movements.  She does report lower abdominal discomfort as well.  She denies any abdominal bloating, rectal bleeding, weight loss.  She denies fecal incontinence or nocturnal diarrhea.  H. pylori breath test was negative.  CT abdomen and pelvis in 12/21 unremarkable.  TSH normal, no evidence of anemia  NSAIDs: None  Antiplts/Anticoagulants/Anti thrombotics: None  GI Procedures: Colonoscopy in 03/2017, unremarkable  Past Medical History:  Diagnosis Date   Arthritis    CAD (coronary artery disease)    s/p stenting x 2   Cancer (HCC)    anus cancer   Colon polyp    Depression    Hyperlipidemia    MI (myocardial infarction) ([place])    Thyroid disease    Vitamin D deficiency     Past Surgical History:  Procedure Laterality Date   CARDIAC SURGERY     CORONARY ANGIOPLASTY WITH STENT PLACEMENT     TUBAL LIGATION      Current Outpatient Medications:    acetaminophen (TYLENOL) 500 MG tablet, Take 1,000 mg by mouth every 6 (six) hours as needed., Disp: , Rfl:    buPROPion (WELLBUTRIN XL) 150 MG 24 hr tablet, Take 1 tablet by mouth once daily, Disp: 90  tablet, Rfl: 0   levothyroxine (SYNTHROID) 88 MCG tablet, TAKE 1 TABLET BY MOUTH ONCE DAILY BEFORE BREAKFAST, Disp: 90 tablet, Rfl: 0   loperamide (IMODIUM) 2 MG capsule, Take 2 mg by mouth as needed for diarrhea or loose stools., Disp: , Rfl:    naproxen (NAPROSYN) 500 MG tablet, Take 500 mg by mouth 3 (three) times daily as needed., Disp: , Rfl:    polyethylene glycol-electrolytes (NULYTELY) 420 g solution, Take 4,000 mLs by mouth once for 1 dose., Disp: 4000 mL, Rfl: 0   venlafaxine XR (EFFEXOR XR) 37.5 MG 24 hr capsule, Take 1 capsule (37.5 mg total) by mouth daily with breakfast., Disp: 30 capsule, Rfl: 2   vitamin B-12 (CYANOCOBALAMIN) 1000 MCG tablet, Take 1,000 mcg by mouth daily., Disp: , Rfl:    nitroGLYCERIN (NITROSTAT) 0.4 MG SL tablet, Place 1 tablet (0.4 mg total) under the tongue every 5 (five) minutes as needed for chest pain. (Patient not taking: No sig reported), Disp: 100 tablet, Rfl: 3   traMADol (ULTRAM) 50 MG tablet, Take 1 tablet (50 mg total) by mouth every 12 (twelve) hours as needed for moderate pain (abdominal or rectal pain)., Disp: 60 tablet, Rfl: 0  Family History  Problem Relation Age of Onset   Alzheimer's disease Mother    CAD Father    Alzheimer's disease Maternal Grandmother    Thyroid disease Brother  Hyperlipidemia Brother    Hyperlipidemia Brother    Alcohol abuse Brother    Healthy Daughter    Healthy Son    Heart disease Paternal Uncle    Heart disease Paternal Grandmother    Heart disease Paternal Grandfather    Thyroid disease Brother    Cirrhosis Brother    Healthy Son    Breast cancer Cousin      Social History   Tobacco Use   Smoking status: Former    Packs/day: 0.75    Years: 30.00    Pack years: 22.50    Types: Cigarettes    Quit date: 01/21/2007    Years since quitting: 14.1   Smokeless tobacco: Never  Vaping Use   Vaping Use: Never used  Substance Use Topics   Alcohol use: No   Drug use: No    Allergies as of 03/20/2021  - Review Complete 03/20/2021  Allergen Reaction Noted   Sulfa antibiotics Rash 07/29/2012   Sulfasalazine Rash 03/23/2017   Tape Rash 01/13/2016    Review of Systems:    All systems reviewed and negative except where noted in HPI.   Physical Exam:  BP (!) 106/58 (BP Location: Right Leg, Patient Position: Sitting, Cuff Size: Normal)   Pulse 97   Temp (!) 97.5 F (36.4 C) (Temporal)   Ht 5\' 3"  (1.6 m)   Wt 175 lb 3.2 oz (79.5 kg)   BMI 31.04 kg/m  No LMP recorded. Patient is postmenopausal.  General:   Alert,  Well-developed, well-nourished, pleasant and cooperative in NAD Head:  Normocephalic and atraumatic. Eyes:  Sclera clear, no icterus.   Conjunctiva pink. Ears:  Normal auditory acuity. Nose:  No deformity, discharge, or lesions. Mouth:  No deformity or lesions,oropharynx pink & moist. Neck:  Supple; no masses or thyromegaly. Lungs:  Respirations even and unlabored.  Clear throughout to auscultation.   No wheezes, crackles, or rhonchi. No acute distress. Heart:  Regular rate and rhythm; no murmurs, clicks, rubs, or gallops. Abdomen:  Normal bowel sounds. Soft, non-tender and non-distended without masses, hepatosplenomegaly or hernias noted.  No guarding or rebound tenderness.   Rectal: Not performed Msk:  Symmetrical without gross deformities. Good, equal movement & strength bilaterally. Pulses:  Normal pulses noted. Extremities:  No clubbing or edema.  No cyanosis. Neurologic:  Alert and oriented x3;  grossly normal neurologically. Skin:  Intact without significant lesions or rashes. No jaundice. Psych:  Alert and cooperative. Normal mood and affect.  Imaging Studies: Reviewed  Assessment and Plan:   Brenda Grimes is a 55 y.o. pleasant Caucasian female with history of anal cancer s/p chemo and radiation in 2014, with no recurrence of disease is seen in consultation for several years history of postprandial diarrhea with abdominal bloating TSH normal, no evidence of  anemia, H. pylori breath test negative  Check GI profile PCR, fecal elastase levels, celiac disease panel, fecal calprotectin levels Recommend colonoscopy with TI evaluation and random colon biopsies If above work-up is negative, will give empiric trial of pancreatic enzymes or Xifaxan  Follow up in 4 months   Cephas Darby, MD

## 2021-03-22 ENCOUNTER — Encounter: Payer: Self-pay | Admitting: Obstetrics and Gynecology

## 2021-03-28 ENCOUNTER — Ambulatory Visit: Payer: Self-pay

## 2021-03-28 DIAGNOSIS — Z20822 Contact with and (suspected) exposure to covid-19: Secondary | ICD-10-CM | POA: Diagnosis not present

## 2021-03-28 NOTE — Telephone Encounter (Signed)
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 She says she's been testing negative with the home tests, so went to [facility] this morning for the PCR 3 day test. She says she is hoarse, has sinus congestion, chest congestion, cough (mucus in the morning), headache (taking Ibuprofen), denies fever, denies SOB. 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 I advised to go on MyChart for a virtual urgent care visit with a provider, care advice given, patient verbalized understanding.   Summary: covid questions and return to work   Pt has been sick for 2 weeks but yesterday her co worker tested positive for covid / pt is going to [facility]  to get a test / pt has a comprised immune system and her employer advised her to come back to work / pt has questions on what to do about covid and returning to work      Reason for Disposition  [1] CLOSE CONTACT COVID-19 EXPOSURE within last 14 days AND [2] weak immune system (e.g., HIV positive, cancer chemo, splenectomy, organ transplant, chronic steroids) AND [3] NO symptoms  Answer Assessment - Initial Assessment Questions 1. COVID-19 EXPOSURE: "Please describe how you were exposed to someone with a COVID-19 infection."     Someone at work 2. PLACE of CONTACT: "Where were you when you were exposed to COVID-19?" (e.g., home, school, medical waiting room; which city?)     Work 3. TYPE of CONTACT: "How much contact was there?" (e.g., sitting next to, live in same house, work in same office, same building)     Work at [employer] 4. DURATION of CONTACT: "How long were you in contact with the COVID-19 patient?" (e.g., a few seconds, passed by person, a few minutes, 15 minutes or longer, live with the patient)     8 hours with the person 5. MASK: "Were you wearing a mask?" "Was the other person wearing a mask?" Note: wearing a mask reduces the risk of an otherwise close contact.      No 6. DATE of CONTACT: "When did you have contact with a COVID-19 patient?" (e.g., how many days ago)     03/26/21 7. COMMUNITY SPREAD: "Are there lots of cases of COVID-19 (community spread) where you live?" (See public health department website, if unsure)       N/A 8. SYMPTOMS: "Do you have any symptoms?" (e.g., fever, cough, breathing difficulty, loss of taste or smell)     Hoarse, sinus congestion, chest congestion, cough (mucus in the morning), headache 9. VACCINE: "Have you gotten the COVID-19 vaccine?" If Yes, ask: "Which one, how many shots, when did you get it?"     2 Moderna vaccines 10. BOOSTER: "Have you received your COVID-19 booster?" If Yes, ask: "Which one and when did you get it?"       No 11. PREGNANCY OR POSTPARTUM: "Is there any chance you are pregnant?" "When was your last menstrual period?" "Did you deliver in the last 2 weeks?"       N/A 12. HIGH RISK: "Do you have any heart or lung problems?" (e.g., asthma , COPD, heart failure) "Do you have a weak immune system or other risk factors?" (e.g., HIV positive, chemotherapy, renal failure, diabetes mellitus, sickle cell anemia, obesity)       Weak immune system, heart condition 13. TRAVEL: "Have you traveled out of the country recently?" If Yes, ask: "When and  where?"  Note: Travel becomes less relevant if there is widespread community transmission where the patient lives.       No  Protocols used: Coronavirus (COVID-19) Exposure-A-AH

## 2021-04-02 ENCOUNTER — Telehealth: Payer: Self-pay

## 2021-04-02 NOTE — Telephone Encounter (Signed)
Pt calling following up on referral to [facility]. Do you know anything regarding this?

## 2021-04-03 ENCOUNTER — Encounter: Payer: BC Managed Care – PPO | Admitting: Internal Medicine

## 2021-04-03 NOTE — Progress Notes (Deleted)
Subjective:    Patient ID: Brenda Grimes, female    DOB: 17-Jul-1965, 55 y.o.   MRN: [id number]  HPI  Patient presents the clinic today for her annual exam.  Flu: 02/2021 Tetanus: COVID: Moderna x2 Shingrix: Pap smear: 02/2021 Mammogram: 02/2019 Colon screening: Scheduled 04/17/2021 Vision screening: Dentist:  Diet: Exercise:  Review of Systems     Past Medical History:  Diagnosis Date   Arthritis    CAD (coronary artery disease)    s/p stenting x 2   Cancer ([place])    anus cancer   Colon polyp    Depression    Hyperlipidemia    MI (myocardial infarction) ([place])    Thyroid disease    Vitamin D deficiency     Current Outpatient Medications  Medication Sig Dispense Refill   acetaminophen (TYLENOL) 500 MG tablet Take 1,000 mg by mouth every 6 (six) hours as needed.     buPROPion (WELLBUTRIN XL) 150 MG 24 hr tablet Take 1 tablet by mouth once daily 90 tablet 0   levothyroxine (SYNTHROID) 88 MCG tablet TAKE 1 TABLET BY MOUTH ONCE DAILY BEFORE BREAKFAST 90 tablet 0   loperamide (IMODIUM) 2 MG capsule Take 2 mg by mouth as needed for diarrhea or loose stools.     naproxen (NAPROSYN) 500 MG tablet Take 500 mg by mouth 3 (three) times daily as needed.     nitroGLYCERIN (NITROSTAT) 0.4 MG SL tablet Place 1 tablet (0.4 mg total) under the tongue every 5 (five) minutes as needed for chest pain. (Patient not taking: No sig reported) 100 tablet 3   traMADol (ULTRAM) 50 MG tablet Take 1 tablet (50 mg total) by mouth every 12 (twelve) hours as needed for moderate pain (abdominal or rectal pain). 60 tablet 0   venlafaxine XR (EFFEXOR XR) 37.5 MG 24 hr capsule Take 1 capsule (37.5 mg total) by mouth daily with breakfast. 30 capsule 2   vitamin B-12 (CYANOCOBALAMIN) 1000 MCG tablet Take 1,000 mcg by mouth daily.     No current facility-administered medications for this visit.    Allergies  Allergen Reactions   Sulfa Antibiotics Rash   Sulfasalazine Rash   Tape Rash    Family  History  Problem Relation Age of Onset   Alzheimer's disease Mother    CAD Father    Alzheimer's disease Maternal Grandmother    Thyroid disease Brother    Hyperlipidemia Brother    Hyperlipidemia Brother    Alcohol abuse Brother    Healthy Daughter    Healthy Son    Heart disease Paternal Uncle    Heart disease Paternal Grandmother    Heart disease Paternal Grandfather    Thyroid disease Brother    Cirrhosis Brother    Healthy Son    Breast cancer Cousin     Social History   Socioeconomic History   Marital status: Married    Spouse name: Not on file   Number of children: Not on file   Years of education: Not on file   Highest education level: Not on file  Occupational History   Not on file  Tobacco Use   Smoking status: Former    Packs/day: 0.75    Years: 30.00    Pack years: 22.50    Types: Cigarettes    Quit date: 01/21/2007    Years since quitting: 14.2   Smokeless tobacco: Never  Vaping Use   Vaping Use: Never used  Substance and Sexual Activity   Alcohol use: No  Drug use: No   Sexual activity: Yes    Birth control/protection: None  Other Topics Concern   Not on file  Social History Narrative   Not on file   Social Determinants of Health   Financial Resource Strain: Low Risk    Difficulty of Paying Living Expenses: Not hard at all  Food Insecurity: No Food Insecurity   Worried About Charity fundraiser in the Last Year: Never true   [place] in the Last Year: Never true  Transportation Needs: No Transportation Needs   Lack of Transportation (Medical): No   Lack of Transportation (Non-Medical): No  Physical Activity: Inactive   Days of Exercise per Week: 0 days   Minutes of Exercise per Session: 0 min  Stress: No Stress Concern Present   Feeling of Stress : Not at all  Social Connections: Not on file  Intimate Partner Violence: Not on file     Constitutional: Denies fever, malaise, fatigue, headache or abrupt weight changes.  HEENT:  Denies eye pain, eye redness, ear pain, ringing in the ears, wax buildup, runny nose, nasal congestion, bloody nose, or sore throat. Respiratory: Denies difficulty breathing, shortness of breath, cough or sputum production.   Cardiovascular: Denies chest pain, chest tightness, palpitations or swelling in the hands or feet.  Gastrointestinal: Patient reports chronic abdominal pain.  Denies bloating, constipation, diarrhea or blood in the stool.  GU: Denies urgency, frequency, pain with urination, burning sensation, blood in urine, odor or discharge. Musculoskeletal: Denies decrease in range of motion, difficulty with gait, muscle pain or joint pain and swelling.  Skin: Denies redness, rashes, lesions or ulcercations.  Neurological: Patient reports hot flashes.  Denies dizziness, difficulty with memory, difficulty with speech or problems with balance and coordination.  Psych: Patient has a history of depression.  Denies anxiety, SI/HI.  No other specific complaints in a complete review of systems (except as listed in HPI above).  Objective:   Physical Exam   There were no vitals taken for this visit. Wt Readings from Last 3 Encounters:  03/20/21 175 lb 3.2 oz (79.5 kg)  02/24/21 175 lb (79.4 kg)  02/04/21 176 lb 12.8 oz (80.2 kg)    General: Appears their stated age, well developed, well nourished in NAD. Skin: Warm, dry and intact. No rashes, lesions or ulcerations noted. HEENT: Head: normal shape and size; Eyes: sclera white, no icterus, conjunctiva pink, PERRLA and EOMs intact; Ears: Tm's gray and intact, normal light reflex; Nose: mucosa pink and moist, septum midline; Throat/Mouth: Teeth present, mucosa pink and moist, no exudate, lesions or ulcerations noted.  Neck:  Neck supple, trachea midline. No masses, lumps or thyromegaly present.  Cardiovascular: Normal rate and rhythm. S1,S2 noted.  No murmur, rubs or gallops noted. No JVD or BLE edema. No carotid bruits  noted. Pulmonary/Chest: Normal effort and positive vesicular breath sounds. No respiratory distress. No wheezes, rales or ronchi noted.  Abdomen: Soft and nontender. Normal bowel sounds. No distention or masses noted. Liver, spleen and kidneys non palpable. Musculoskeletal: Normal range of motion. No signs of joint swelling. No difficulty with gait.  Neurological: Alert and oriented. Cranial nerves II-XII grossly intact. Coordination normal.  Psychiatric: Mood and affect normal. Behavior is normal. Judgment and thought content normal.   EKG:  BMET    Component Value Date/Time   NA 141 02/04/2021 1556   K 4.3 02/04/2021 1556   CL 108 02/04/2021 1556   CO2 28 02/04/2021 1556   GLUCOSE 88  02/04/2021 1556   BUN 14 02/04/2021 1556   CREATININE 0.75 02/04/2021 1556   CALCIUM 9.0 02/04/2021 1556   GFRNONAA 79 04/04/2020 0904   GFRAA 91 04/04/2020 0904    Lipid Panel     Component Value Date/Time   CHOL 183 09/07/2019 1001   TRIG 66 09/07/2019 1001   HDL 61 09/07/2019 1001   CHOLHDL 3.0 09/07/2019 1001   LDLCALC 107 (H) 09/07/2019 1001    CBC    Component Value Date/Time   WBC 3.3 (L) 02/04/2021 1556   RBC 3.78 (L) 02/04/2021 1556   HGB 12.8 02/04/2021 1556   HCT 37.6 02/04/2021 1556   PLT 192 02/04/2021 1556   MCV 99.5 02/04/2021 1556   MCH 33.9 (H) 02/04/2021 1556   MCHC 34.0 02/04/2021 1556   RDW 12.2 02/04/2021 1556   LYMPHSABS 1.0 04/15/2020 1209   MONOABS 0.3 04/15/2020 1209   EOSABS 0.1 04/15/2020 1209   BASOSABS 0.0 04/15/2020 1209    Hgb A1C No results found for: HGBA1C         Assessment & Plan:   Preventative Health Maintenance:  Flu shot UTD Tetanus Encouraged her to get her COVID booster Discussed Shingrix vaccine, she will check coverage with insurance company Pap smear UTD Mammogram Colon screening scheduled Encouraged her to consume a balanced diet and exercise regimen Advised her to see an eye doctor and dentist annually We will check  CBC, c-Met, lipid, A1c, HIV and hep C today  RTC in 6 months, follow-up chronic conditions  Webb Silversmith, NP This visit occurred during the SARS-CoV-2 public health emergency.  Safety protocols were in place, including screening questions prior to the visit, additional usage of staff PPE, and extensive cleaning of exam room while observing appropriate contact time as indicated for disinfecting solutions.

## 2021-04-08 ENCOUNTER — Encounter: Payer: Self-pay | Admitting: Gastroenterology

## 2021-04-11 ENCOUNTER — Encounter: Payer: Self-pay | Admitting: Internal Medicine

## 2021-04-17 ENCOUNTER — Encounter
Admission: RE | Disposition: A | Discharge status: Home / Self Care | Payer: Self-pay | Source: Home / Self Care | Attending: Gastroenterology

## 2021-04-17 ENCOUNTER — Ambulatory Visit: Payer: BC Managed Care – PPO | Admitting: Anesthesiology

## 2021-04-17 ENCOUNTER — Encounter: Payer: Self-pay | Admitting: Gastroenterology

## 2021-04-17 ENCOUNTER — Ambulatory Visit
Admission: RE | Admit: 2021-04-17 | Discharge: 2021-04-17 | Disposition: A | Discharge status: Home / Self Care | Payer: BC Managed Care – PPO | Attending: Gastroenterology | Admitting: Gastroenterology

## 2021-04-17 ENCOUNTER — Other Ambulatory Visit: Payer: Self-pay

## 2021-04-17 DIAGNOSIS — D120 Benign neoplasm of cecum: Secondary | ICD-10-CM | POA: Diagnosis not present

## 2021-04-17 DIAGNOSIS — I2510 Atherosclerotic heart disease of native coronary artery without angina pectoris: Secondary | ICD-10-CM | POA: Insufficient documentation

## 2021-04-17 DIAGNOSIS — K635 Polyp of colon: Secondary | ICD-10-CM | POA: Diagnosis not present

## 2021-04-17 DIAGNOSIS — K621 Rectal polyp: Secondary | ICD-10-CM | POA: Insufficient documentation

## 2021-04-17 DIAGNOSIS — Z923 Personal history of irradiation: Secondary | ICD-10-CM | POA: Diagnosis not present

## 2021-04-17 DIAGNOSIS — M1990 Unspecified osteoarthritis, unspecified site: Secondary | ICD-10-CM | POA: Insufficient documentation

## 2021-04-17 DIAGNOSIS — R197 Diarrhea, unspecified: Secondary | ICD-10-CM | POA: Diagnosis not present

## 2021-04-17 DIAGNOSIS — Z9221 Personal history of antineoplastic chemotherapy: Secondary | ICD-10-CM | POA: Diagnosis not present

## 2021-04-17 DIAGNOSIS — Z955 Presence of coronary angioplasty implant and graft: Secondary | ICD-10-CM | POA: Insufficient documentation

## 2021-04-17 DIAGNOSIS — Z87891 Personal history of nicotine dependence: Secondary | ICD-10-CM | POA: Diagnosis not present

## 2021-04-17 DIAGNOSIS — Z85048 Personal history of other malignant neoplasm of rectum, rectosigmoid junction, and anus: Secondary | ICD-10-CM | POA: Insufficient documentation

## 2021-04-17 DIAGNOSIS — I252 Old myocardial infarction: Secondary | ICD-10-CM | POA: Insufficient documentation

## 2021-04-17 DIAGNOSIS — K529 Noninfective gastroenteritis and colitis, unspecified: Secondary | ICD-10-CM

## 2021-04-17 DIAGNOSIS — E039 Hypothyroidism, unspecified: Secondary | ICD-10-CM | POA: Insufficient documentation

## 2021-04-17 HISTORY — PX: POLYPECTOMY: SHX5525

## 2021-04-17 HISTORY — PX: COLONOSCOPY WITH PROPOFOL: SHX5780

## 2021-04-17 SURGERY — COLONOSCOPY WITH PROPOFOL
Anesthesia: General | Site: Rectum

## 2021-04-17 MED ORDER — LACTATED RINGERS IV SOLN: INTRAVENOUS | Status: DC

## 2021-04-17 MED ORDER — LIDOCAINE HCL (CARDIAC) PF 100 MG/5ML IV SOSY
PREFILLED_SYRINGE | INTRAVENOUS | Status: DC | PRN
  Administered 2021-04-17: 30 mg via INTRAVENOUS

## 2021-04-17 MED ORDER — PROPOFOL 10 MG/ML IV BOLUS
INTRAVENOUS | Status: DC | PRN
  Administered 2021-04-17: 150 mg via INTRAVENOUS
  Administered 2021-04-17: 40 mg via INTRAVENOUS
  Administered 2021-04-17: 50 mg via INTRAVENOUS
  Administered 2021-04-17: 40 mg via INTRAVENOUS
  Administered 2021-04-17: 30 mg via INTRAVENOUS
  Administered 2021-04-17 (×2): 40 mg via INTRAVENOUS

## 2021-04-17 MED ORDER — STERILE WATER FOR IRRIGATION IR SOLN
Status: DC | PRN
  Administered 2021-04-17: 1

## 2021-04-17 MED ORDER — SODIUM CHLORIDE 0.9 % IV SOLN: INTRAVENOUS | Status: DC

## 2021-04-17 SURGICAL SUPPLY — 9 items
FORCEPS BIOP RAD 4 LRG CAP 4 (CUTTING FORCEPS) ×1 IMPLANT
GOWN CVR UNV OPN BCK APRN NK (MISCELLANEOUS) ×2 IMPLANT
GOWN ISOL THUMB LOOP REG UNIV (MISCELLANEOUS) ×4
KIT PRC NS LF DISP ENDO (KITS) ×1 IMPLANT
KIT PROCEDURE OLYMPUS (KITS) ×2
MANIFOLD NEPTUNE II (INSTRUMENTS) ×2 IMPLANT
SNARE COLD EXACTO (MISCELLANEOUS) ×1 IMPLANT
TRAP ETRAP POLY (MISCELLANEOUS) ×1 IMPLANT
WATER STERILE IRR 250ML POUR (IV SOLUTION) ×2 IMPLANT

## 2021-04-17 NOTE — Anesthesia Procedure Notes (Signed)
Date/Time: 04/17/2021 11:27 AM Performed by: Cameron Ali, CRNA Pre-anesthesia Checklist: Patient identified, Emergency Drugs available, Suction available, Timeout performed and Patient being monitored Patient Re-evaluated:Patient Re-evaluated prior to induction Oxygen Delivery Method: Nasal cannula Placement Confirmation: positive ETCO2

## 2021-04-17 NOTE — H&P (Signed)
Cephas Darby, MD [address]  [place], [place] [address]  Main: [phone]  Fax: [phone] Pager: [phone]  Primary Care Physician:  Jearld Fenton, NP Primary Gastroenterologist:  Dr. Cephas Darby  Pre-Procedure History & Physical: HPI:  Brenda Grimes is a 55 y.o. female is here for an colonoscopy.   Past Medical History:  Diagnosis Date   Arthritis    CAD (coronary artery disease)    s/p stenting x 2   Cancer ([place])    anus cancer   Colon polyp    Depression    Hyperlipidemia    MI (myocardial infarction) ([place])    Thyroid disease    Vitamin D deficiency     Past Surgical History:  Procedure Laterality Date   CARDIAC SURGERY     CORONARY ANGIOPLASTY WITH STENT PLACEMENT     TUBAL LIGATION      Prior to Admission medications   Medication Sig Start Date End Date Taking? Authorizing Provider  acetaminophen (TYLENOL) 500 MG tablet Take 1,000 mg by mouth every 6 (six) hours as needed.   Yes [provider]  buPROPion (WELLBUTRIN XL) 150 MG 24 hr tablet Take 1 tablet by mouth once daily 02/21/21  Yes Baity, Coralie Keens, NP  levothyroxine (SYNTHROID) 88 MCG tablet TAKE 1 TABLET BY MOUTH ONCE DAILY BEFORE BREAKFAST 03/07/21  Yes Jearld Fenton, NP  loperamide (IMODIUM) 2 MG capsule Take 2 mg by mouth as needed for diarrhea or loose stools.   Yes [provider]  naproxen (NAPROSYN) 500 MG tablet Take 500 mg by mouth 3 (three) times daily as needed. 01/21/17  Yes [provider]  traMADol (ULTRAM) 50 MG tablet Take 1 tablet (50 mg total) by mouth every 12 (twelve) hours as needed for moderate pain (abdominal or rectal pain). 03/20/21  Yes Jearld Fenton, NP  venlafaxine XR (EFFEXOR XR) 37.5 MG 24 hr capsule Take 1 capsule (37.5 mg total) by mouth daily with breakfast. 12/09/20  Yes Baity, Coralie Keens, NP  vitamin B-12 (CYANOCOBALAMIN) 1000 MCG tablet Take 1,000 mcg by mouth daily.   Yes [provider]  nitroGLYCERIN  (NITROSTAT) 0.4 MG SL tablet Place 1 tablet (0.4 mg total) under the tongue every 5 (five) minutes as needed for chest pain. Patient not taking: No sig reported 03/23/17 03/23/18  Earleen Newport, MD    Allergies as of 03/20/2021 - Review Complete 03/20/2021  Allergen Reaction Noted   Sulfa antibiotics Rash 07/29/2012   Sulfasalazine Rash 03/23/2017   Tape Rash 01/13/2016    Family History  Problem Relation Age of Onset   Alzheimer's disease Mother    CAD Father    Alzheimer's disease Maternal Grandmother    Thyroid disease Brother    Hyperlipidemia Brother    Hyperlipidemia Brother    Alcohol abuse Brother    Healthy Daughter    Healthy Son    Heart disease Paternal Uncle    Heart disease Paternal Grandmother    Heart disease Paternal Grandfather    Thyroid disease Brother    Cirrhosis Brother    Healthy Son    Breast cancer Cousin     Social History   Socioeconomic History   Marital status: Married    Spouse name: Not on file   Number of children: Not on file   Years of education: Not on file   Highest education level: Not on file  Occupational History   Not on file  Tobacco Use  Smoking status: Former    Packs/day: 0.75    Years: 30.00    Pack years: 22.50    Types: Cigarettes    Quit date: 01/21/2007    Years since quitting: 14.2   Smokeless tobacco: Never  Vaping Use   Vaping Use: Never used  Substance and Sexual Activity   Alcohol use: No   Drug use: No   Sexual activity: Yes    Birth control/protection: None  Other Topics Concern   Not on file  Social History Narrative   Not on file   Social Determinants of Health   Financial Resource Strain: Low Risk    Difficulty of Paying Living Expenses: Not hard at all  Food Insecurity: No Food Insecurity   Worried About Charity fundraiser in the Last Year: Never true   Ran Out of Food in the Last Year: Never true  Transportation Needs: No Transportation Needs   Lack of Transportation (Medical):  No   Lack of Transportation (Non-Medical): No  Physical Activity: Inactive   Days of Exercise per Week: 0 days   Minutes of Exercise per Session: 0 min  Stress: No Stress Concern Present   Feeling of Stress : Not at all  Social Connections: Not on file  Intimate Partner Violence: Not on file    Review of Systems: See HPI, otherwise negative ROS  Physical Exam: BP 120/61    Temp 98.4 F (36.9 C) (Temporal)    Resp (!) 97    Ht 5' 2.99" (1.6 m)    Wt 78 kg    SpO2 97%    BMI 30.48 kg/m  General:   Alert,  pleasant and cooperative in NAD Head:  Normocephalic and atraumatic. Neck:  Supple; no masses or thyromegaly. Lungs:  Clear throughout to auscultation.    Heart:  Regular rate and rhythm. Abdomen:  Soft, nontender and nondistended. Normal bowel sounds, without guarding, and without rebound.   Neurologic:  Alert and  oriented x4;  grossly normal neurologically.  Impression/Plan: Brenda Grimes is here for an colonoscopy to be performed for chronic diarrhea  Risks, benefits, limitations, and alternatives regarding  colonoscopy have been reviewed with the patient.  Questions have been answered.  All parties agreeable.   Sherri Sear, MD  04/17/2021, 10:42 AM

## 2021-04-17 NOTE — Op Note (Signed)
[facility] Gastroenterology Patient Name: Brenda Grimes Procedure Date: 04/17/2021 11:11 AM MRN: [id number] Account #: [id number] Date of Birth: 1965-10-28 Admit Type: Outpatient Age: 55 Room: [facility] OR ROOM 01 Gender: Female Note Status: Finalized Instrument Name: [id number] Procedure:             Colonoscopy Indications:           Chronic diarrhea, Clinically significant diarrhea of                         unexplained origin Providers:             Lin Landsman MD, MD Medicines:             General Anesthesia Complications:         No immediate complications. Estimated blood loss: None. Procedure:             Pre-Anesthesia Assessment:                        - Prior to the procedure, a History and Physical was                         performed, and patient medications and allergies were                         reviewed. The patient is competent. The risks and                         benefits of the procedure and the sedation options and                         risks were discussed with the patient. All questions                         were answered and informed consent was obtained.                         Patient identification and proposed procedure were                         verified by the physician, the nurse, the                         anesthesiologist, the anesthetist and the technician                         in the pre-procedure area in the procedure room in the                         endoscopy suite. Mental Status Examination: alert and                         oriented. Airway Examination: normal oropharyngeal                         airway and neck mobility. Respiratory Examination:                         clear to auscultation. CV Examination: normal.  Prophylactic Antibiotics: The patient does not require                         prophylactic antibiotics. Prior Anticoagulants: The                         patient has  taken no previous anticoagulant or                         antiplatelet agents. ASA Grade Assessment: III - A                         patient with severe systemic disease. After reviewing                         the risks and benefits, the patient was deemed in                         satisfactory condition to undergo the procedure. The                         anesthesia plan was to use general anesthesia.                         Immediately prior to administration of medications,                         the patient was re-assessed for adequacy to receive                         sedatives. The heart rate, respiratory rate, oxygen                         saturations, blood pressure, adequacy of pulmonary                         ventilation, and response to care were monitored                         throughout the procedure. The physical status of the                         patient was re-assessed after the procedure.                        After obtaining informed consent, the colonoscope was                         passed under direct vision. Throughout the procedure,                         the patient's blood pressure, pulse, and oxygen                         saturations were monitored continuously. The                         Colonoscope was introduced through the anus and  advanced to the the terminal ileum, with                         identification of the appendiceal orifice and IC                         valve. The colonoscopy was performed without                         difficulty. The patient tolerated the procedure well.                         The quality of the bowel preparation was evaluated                         using the BBPS [facility] Bowel Preparation Scale) with                         scores of: Right Colon = 3, Transverse Colon = 3 and                         Left Colon = 3 (entire mucosa seen well with no                         residual  staining, small fragments of stool or opaque                         liquid). The total BBPS score equals 9. Findings:      The perianal and digital rectal examinations were normal. Pertinent       negatives include normal sphincter tone and no palpable rectal lesions.      The terminal ileum appeared normal.      Normal mucosa was found in the entire colon. Biopsies were taken with a       cold forceps for histology.      Normal mucosa was found in the rectum. Biopsies were taken with a cold       forceps for histology.      Two sessile polyps were found in the cecum. The polyps were 3 to 4 mm in       size. These polyps were removed with a cold snare. Resection and       retrieval were complete. Estimated blood loss: none. Impression:            - The examined portion of the ileum was normal.                        - Normal mucosa in the entire examined colon. Biopsied.                        - Normal mucosa in the rectum. Biopsied.                        - Two 3 to 4 mm polyps in the cecum, removed with a                         cold snare. Resected and retrieved. Recommendation:        -  Discharge patient to home (with escort).                        - Resume previous diet today.                        - Continue present medications.                        - Await pathology results.                        - Return to my office as previously scheduled. Procedure Code(s):     --- Professional ---                        [phone], Colonoscopy, flexible; with removal of                         tumor(s), polyp(s), or other lesion(s) by snare                         technique                        [id number], 36, Colonoscopy, flexible; with biopsy, single                         or multiple Diagnosis Code(s):     --- Professional ---                        K63.5, Polyp of colon                        K52.9, Noninfective gastroenteritis and colitis,                         unspecified                         R19.7, Diarrhea, unspecified CPT copyright 2019 American Medical Association. All rights reserved. The codes documented in this report are preliminary and upon coder review may  be revised to meet current compliance requirements. Dr. Ulyess Mort Lin Landsman MD, MD 04/17/2021 11:41:44 AM This report has been signed electronically. Number of Addenda: 0 Note Initiated On: 04/17/2021 11:11 AM Scope Withdrawal Time: 0 hours 17 minutes 50 seconds  Total Procedure Duration: 0 hours 20 minutes 59 seconds  Estimated Blood Loss:  Estimated blood loss: none.      [facility]

## 2021-04-17 NOTE — Transfer of Care (Signed)
Immediate Anesthesia Transfer of Care Note  Patient: Brenda Grimes  Procedure(s) Performed: COLONOSCOPY WITH BIOPSY (Rectum) POLYPECTOMY (Rectum)  Patient Location: PACU  Anesthesia Type: General  Level of Consciousness: awake, alert  and patient cooperative  Airway and Oxygen Therapy: Patient Spontanous Breathing and Patient connected to supplemental oxygen  Post-op Assessment: Post-op Vital signs reviewed, Patient's Cardiovascular Status Stable, Respiratory Function Stable, Patent Airway and No signs of Nausea or vomiting  Post-op Vital Signs: Reviewed and stable  Complications: No notable events documented.

## 2021-04-17 NOTE — Anesthesia Postprocedure Evaluation (Signed)
Anesthesia Post Note  Patient: Brenda Grimes  Procedure(s) Performed: COLONOSCOPY WITH BIOPSY (Rectum) POLYPECTOMY (Rectum)     Patient location during evaluation: PACU Anesthesia Type: General Level of consciousness: awake and alert Pain management: pain level controlled Vital Signs Assessment: post-procedure vital signs reviewed and stable Respiratory status: spontaneous breathing, nonlabored ventilation and respiratory function stable Cardiovascular status: blood pressure returned to baseline and stable Postop Assessment: no apparent nausea or vomiting Anesthetic complications: no   No notable events documented.  April Manson

## 2021-04-17 NOTE — Anesthesia Preprocedure Evaluation (Signed)
Anesthesia Evaluation  Patient identified by MRN, date of birth, ID band Patient awake    Reviewed: Allergy & Precautions, H&P , NPO status , Patient's Chart, lab work & pertinent test results, reviewed documented beta blocker date and time   Airway Mallampati: II  TM Distance: >3 FB Neck ROM: full    Dental no notable dental hx.    Pulmonary neg pulmonary ROS, former smoker,    Pulmonary exam normal breath sounds clear to auscultation       Cardiovascular Exercise Tolerance: Good + angina + CAD, + Past MI and + Cardiac Stents  negative cardio ROS   Rhythm:regular Rate:Normal     Neuro/Psych negative neurological ROS  negative psych ROS   GI/Hepatic negative GI ROS, Neg liver ROS,   Endo/Other  negative endocrine ROSHypothyroidism   Renal/GU negative Renal ROS  negative genitourinary   Musculoskeletal  (+) Arthritis ,   Abdominal   Peds  Hematology negative hematology ROS (+)   Anesthesia Other Findings Anal CA  Reproductive/Obstetrics negative OB ROS                             Anesthesia Physical Anesthesia Plan  ASA: 3  Anesthesia Plan: General   Post-op Pain Management:    Induction:   PONV Risk Score and Plan: 3 and Propofol infusion and TIVA  Airway Management Planned:   Additional Equipment:   Intra-op Plan:   Post-operative Plan:   Informed Consent: I have reviewed the patients History and Physical, chart, labs and discussed the procedure including the risks, benefits and alternatives for the proposed anesthesia with the patient or authorized representative who has indicated his/her understanding and acceptance.     Dental Advisory Given  Plan Discussed with: CRNA  Anesthesia Plan Comments:         Anesthesia Quick Evaluation

## 2021-04-18 ENCOUNTER — Encounter: Payer: Self-pay | Admitting: Gastroenterology

## 2021-04-21 LAB — SURGICAL PATHOLOGY

## 2021-04-22 ENCOUNTER — Encounter: Payer: Self-pay | Admitting: Obstetrics and Gynecology

## 2021-04-22 ENCOUNTER — Encounter: Payer: Self-pay | Admitting: Gastroenterology

## 2021-04-23 ENCOUNTER — Telehealth: Payer: Self-pay

## 2021-04-23 NOTE — Telephone Encounter (Signed)
-----   Message from Lin Landsman, MD sent at 04/22/2021  4:14 PM EST ----- Please inform patient that the pathology results from colonoscopy came back normal.  If she is still experiencing diarrhea, recommend to drop of the stool specimen for the stool studies that I ordered  Rohini Vanga

## 2021-04-23 NOTE — Telephone Encounter (Signed)
CALLED PATIENT NO ANSWER LEFT VOICEMAIL FOR A CALL BACK ? ?

## 2021-04-24 ENCOUNTER — Telehealth: Payer: Self-pay

## 2021-04-24 NOTE — Telephone Encounter (Signed)
-----   Message from Lin Landsman, MD sent at 04/22/2021  4:14 PM EST ----- Please inform patient that the pathology results from colonoscopy came back normal.  If she is still experiencing diarrhea, recommend to drop of the stool specimen for the stool studies that I ordered  Rohini Vanga

## 2021-04-24 NOTE — Telephone Encounter (Signed)
CALLED PATIENT NO ANSWER LEFT VOICEMAIL FOR A CALL BACK ? ?

## 2021-04-24 NOTE — Telephone Encounter (Signed)
CALLED PATIENT ABOUT RESULTS SHE IS STILL HAVING DISCOMFORT AND DIARRHEA  I REMINDED HER TO BRING IN THE STOOL SAMPLES AS THEY MAY GIVE MORE ANSWERS TO WHAT IS GOING ON

## 2021-05-05 ENCOUNTER — Other Ambulatory Visit: Payer: Self-pay | Admitting: Internal Medicine

## 2021-05-05 ENCOUNTER — Encounter: Payer: Self-pay | Admitting: Internal Medicine

## 2021-05-05 DIAGNOSIS — G894 Chronic pain syndrome: Secondary | ICD-10-CM

## 2021-05-06 MED ORDER — TRAMADOL HCL 50 MG PO TABS: 50.0000 mg | ORAL_TABLET | Freq: Two times a day (BID) | ORAL | 0 refills | Status: DC | PRN

## 2021-05-10 ENCOUNTER — Encounter: Payer: Self-pay | Admitting: Gastroenterology

## 2021-05-27 ENCOUNTER — Other Ambulatory Visit: Payer: Self-pay | Admitting: Internal Medicine

## 2021-05-27 DIAGNOSIS — F339 Major depressive disorder, recurrent, unspecified: Secondary | ICD-10-CM

## 2021-05-27 NOTE — Telephone Encounter (Signed)
Requested Prescriptions  Pending Prescriptions Disp Refills   buPROPion (WELLBUTRIN XL) 150 MG 24 hr tablet [Pharmacy Med Name: buPROPion HCl ER (XL) 150 MG Oral Tablet Extended Release 24 Hour] 90 tablet 0    Sig: Take 1 tablet by mouth once daily     Psychiatry: Antidepressants - bupropion Passed - 05/27/2021  9:21 AM      Passed - Completed PHQ-2 or PHQ-9 in the last 360 days      Passed - Last BP in normal range    BP Readings from Last 1 Encounters:  04/17/21 102/65         Passed - Valid encounter within last 6 months    Recent Outpatient Visits          3 months ago Bowel habit changes   [place], [place], NP   8 months ago Hot flashes due to menopause   [place], Coralie Keens, NP   9 months ago Depression, unspecified depression type   [place], [place], [place]   10 months ago Anal cancer [facility])   [facility] Olin Hauser, DO   1 year ago COVID-18 virus infection   [place], Devonne Doughty, DO      Future Appointments            In 1 month [facility], [facility]

## 2021-06-02 ENCOUNTER — Other Ambulatory Visit: Payer: Self-pay

## 2021-06-02 ENCOUNTER — Emergency Department
Admission: EM | Admit: 2021-06-02 | Discharge: 2021-06-02 | Disposition: A | Discharge status: Home / Self Care | Payer: BC Managed Care – PPO | Attending: Emergency Medicine | Admitting: Emergency Medicine

## 2021-06-02 ENCOUNTER — Encounter: Payer: Self-pay | Admitting: Emergency Medicine

## 2021-06-02 DIAGNOSIS — N907 Vulvar cyst: Secondary | ICD-10-CM | POA: Diagnosis not present

## 2021-06-02 DIAGNOSIS — N898 Other specified noninflammatory disorders of vagina: Secondary | ICD-10-CM | POA: Diagnosis not present

## 2021-06-02 DIAGNOSIS — X58XXXA Exposure to other specified factors, initial encounter: Secondary | ICD-10-CM | POA: Insufficient documentation

## 2021-06-02 DIAGNOSIS — S30824A Blister (nonthermal) of vagina and vulva, initial encounter: Secondary | ICD-10-CM | POA: Diagnosis not present

## 2021-06-02 DIAGNOSIS — T148XXA Other injury of unspecified body region, initial encounter: Secondary | ICD-10-CM

## 2021-06-02 DIAGNOSIS — S90821A Blister (nonthermal), right foot, initial encounter: Secondary | ICD-10-CM | POA: Diagnosis not present

## 2021-06-02 NOTE — ED Triage Notes (Signed)
Pt presents with "blood blister" to vaginal area. Pt states she is unable to control bleeding. Pt denies neuro symptoms associated with bleeding. Pt not on blood thinners. Pt alert and oriented x4.

## 2021-06-02 NOTE — Discharge Instructions (Signed)
Please keep wound covered with Surgicel.  This will help with clotting.  You may remove Surgicel in the morning.  If bleeding continues would recommend follow-up with primary care provider or GYN provider.  Return to the ER for any severe bleeding, worsening symptoms or urgent changes in health.

## 2021-06-02 NOTE — ED Provider Notes (Signed)
[place] EMERGENCY DEPARTMENT Provider Note   CSN: [id number] Arrival date & time: 06/02/21  2102     History  Chief Complaint  Patient presents with   Abscess    Brenda Grimes is a 56 y.o. female presents to the emergency department evaluation of a bleeding wound to the right labia.  The wound started to bleed earlier today.  No trauma or injury.  No warmth redness.  Bleeding was significant and that it would not stop.  They came to the ER and patient started bleeding through her pants.  Currently bleeding has stopped.  Patient denies any dizziness, lightheadedness, chest pain or shortness of breath.  She is not on any oral anticoagulation medications.  No clotting disorders.  HPI     Home Medications Prior to Admission medications   Medication Sig Start Date End Date Taking? Authorizing Provider  acetaminophen (TYLENOL) 500 MG tablet Take 1,000 mg by mouth every 6 (six) hours as needed.    [provider]  buPROPion (WELLBUTRIN XL) 150 MG 24 hr tablet Take 1 tablet by mouth once daily 05/27/21   Jearld Fenton, NP  levothyroxine (SYNTHROID) 88 MCG tablet TAKE 1 TABLET BY MOUTH ONCE DAILY BEFORE BREAKFAST 03/07/21   Jearld Fenton, NP  loperamide (IMODIUM) 2 MG capsule Take 2 mg by mouth as needed for diarrhea or loose stools.    [provider]  naproxen (NAPROSYN) 500 MG tablet Take 500 mg by mouth 3 (three) times daily as needed. 01/21/17   [provider]  nitroGLYCERIN (NITROSTAT) 0.4 MG SL tablet Place 1 tablet (0.4 mg total) under the tongue every 5 (five) minutes as needed for chest pain. Patient not taking: No sig reported 03/23/17 03/23/18  Earleen Newport, MD  traMADol (ULTRAM) 50 MG tablet Take 1 tablet (50 mg total) by mouth every 12 (twelve) hours as needed for moderate pain (abdominal or rectal pain). 05/06/21   Jearld Fenton, NP  venlafaxine XR (EFFEXOR XR) 37.5 MG 24 hr capsule Take 1 capsule (37.5 mg total) by  mouth daily with breakfast. 12/09/20   Jearld Fenton, NP  vitamin B-12 (CYANOCOBALAMIN) 1000 MCG tablet Take 1,000 mcg by mouth daily.    [provider]      Allergies    Sulfa antibiotics, Sulfasalazine, and Tape    Review of Systems   Review of Systems  Physical Exam Updated Vital Signs BP 117/82 (BP Location: Left Arm)    Pulse 81    Temp 98.2 F (36.8 C) (Oral)    Resp 20    Ht 5\' 4"  (1.626 m)    Wt 78.5 kg    SpO2 97%    BMI 29.70 kg/m  Physical Exam Constitutional:      Appearance: She is well-developed.  HENT:     Head: Normocephalic and atraumatic.  Eyes:     Conjunctiva/sclera: Conjunctivae normal.  Cardiovascular:     Rate and Rhythm: Normal rate.  Pulmonary:     Effort: Pulmonary effort is normal. No respiratory distress.  Genitourinary:    Comments: Right labia with a small pinpoint superficial blood blister.  This does not appear to be any type of infectious process or folliculitis.  No tenderness, warmth, redness or induration.  Small blood blister is not actively bleeding on initial exam but with cleansing the skin I did cause it to restart bleeding.  Bleeding was stopped with Surgicel.  When present, bleeding was very mild, nonpulsatile Musculoskeletal:  General: Normal range of motion.     Cervical back: Normal range of motion.  Skin:    General: Skin is warm.     Findings: No rash.  Neurological:     Mental Status: She is alert and oriented to person, place, and time.  Psychiatric:        Behavior: Behavior normal.        Thought Content: Thought content normal.    ED Results / Procedures / Treatments   Labs (all labs ordered are listed, but only abnormal results are displayed) Labs Reviewed - No data to display  EKG None  Radiology No results found.  Procedures   Medications Ordered in ED Medications - No data to display  ED Course/ Medical Decision Making/ A&P                           Medical Decision  Making  56 year old with bleeding wound to the right labia.  This is a small 1 to 2 mm diameter blood blister that was steadily bleeding bleeding did stop with compression and then with Surgicel.  Currently no active bleeding.  They are educated on wound care at home and will follow-up with PCP or GYN if bleeding returns.  No signs of any infectious process so no antibiotics given.  Patient's vital signs are stable, asymptomatic.  She understands signs symptoms return to the ER for. Final Clinical Impression(s) / ED Diagnoses Final diagnoses:  Cyst of vagina  Blood blister    Rx / DC Orders ED Discharge Orders     None         Renata Caprice 06/02/21 2310    Blake Divine, MD 06/02/21 2328

## 2021-06-03 ENCOUNTER — Ambulatory Visit: Payer: Self-pay

## 2021-06-03 NOTE — Telephone Encounter (Signed)
°  Chief Complaint: labs Symptoms: extreme fatigue, nausea, and chest discomfort at times  Frequency: since last night from ED visit Pertinent Negatives: NA Disposition: [] ED /[] Urgent Care (no appt availability in office) / [x] Appointment(In office/virtual)/ []  [facility]/ [] Home Care/ [] Refused Recommended Disposition /[]  Mobile Bus/ []  Follow-up with PCP Additional Notes: moved pt's f/up appt from 06/05/21 to tomorrow with Dr. Raliegh Ip, advised pt will send to him to see if f/up earlier is needed and can do labs with appt if suggested.    Summary: labs prior to hospital follow up    Patient was seen in the hospital on 06/02/2021 and states she lost a lot of blood, patient states she feels tired and unsure if PCP would like her to have blood work prior to hospital follow up scheduled for 06/05/2021       Reason for Disposition  [1] Recent medical visit within 24 hours AND [2] condition / symptoms WORSE  Answer Assessment - Initial Assessment Questions 1. MAIN CONCERN OR SYMPTOM:  "What is your main concern right now?" "What question do you have?" "What's the main symptom you're worried about?" (e.g., breathing difficulty, cough, fever. pain)     Wanting to know if labs needed to be done before appt on 06/05/21 2. ONSET: "When did the  sx  start?"     Bleeding on vaginal area started last night went to ED   4. VISIT DATE: "When were you seen?" (Date)     06/02/21 5. VISIT DOCTOR: "What is the name of the doctor taking care of you now?"     Rollene Fare, NP 6. VISIT DIAGNOSIS:  "What was the main symptom or problem that you were seen for?" "Were you given a diagnosis?"      Vaginal cyst  8. NEXT APPOINTMENT: "Have you scheduled a follow-up appointment with your doctor?"     06/05/21 9. PAIN: "Is there any pain?" If Yes, ask: "How bad is it?"  (Scale 0-10; or mild, moderate, severe)    - NONE (0): no pain    - MILD (1-3): doesn't interfere with normal activities     - MODERATE  (4-7): interferes with normal activities or awakens from sleep     - SEVERE (8-10): excruciating pain, unable to do any normal activities     Slight chest pain unsure if from exhaustion or anxiety 10. FEVER: "Do you have a fever?" If Yes, ask: "What is it, how was it measured  and when did it start?"       NO 11. OTHER SYMPTOMS: "Do you have any other symptoms?"       Extreme fatigue and nausea  Protocols used: Recent Medical Visit for Illness Follow-up Call-A-AH

## 2021-06-04 ENCOUNTER — Other Ambulatory Visit: Payer: Self-pay

## 2021-06-04 ENCOUNTER — Ambulatory Visit: Payer: BC Managed Care – PPO | Admitting: Family Medicine

## 2021-06-04 ENCOUNTER — Encounter: Payer: Self-pay | Admitting: Family Medicine

## 2021-06-04 VITALS — BP 114/69 | HR 85 | Ht 64.0 in | Wt 180.4 lb

## 2021-06-04 DIAGNOSIS — D62 Acute posthemorrhagic anemia: Secondary | ICD-10-CM

## 2021-06-04 DIAGNOSIS — R58 Hemorrhage, not elsewhere classified: Secondary | ICD-10-CM | POA: Diagnosis not present

## 2021-06-04 DIAGNOSIS — T148XXA Other injury of unspecified body region, initial encounter: Secondary | ICD-10-CM

## 2021-06-04 NOTE — Patient Instructions (Addendum)
Thank you for coming to the office today.  Stay tuned on blood test results in 24-48 hours on mychart.  If no cause of excessive bleeding identified then may just be spontaneous issue or unrelated to other health.  Make sure to follow up with GYN in near future if further concerns.  Please schedule a Follow-up Appointment to: Return if symptoms worsen or fail to improve.  If you have any other questions or concerns, please feel free to call the office or send a message through [place]. You may also schedule an earlier appointment if necessary.  Additionally, you may be receiving a survey about your experience at our office within a few days to 1 week by e-mail or mail. We value your feedback.  [name], DO [place]

## 2021-06-04 NOTE — Telephone Encounter (Signed)
Brenda Grimes, for some reason my hospital follow-ups are being scheduled with Dr. Raliegh Ip.  I am not sure if it is because they are saying they need to be seen within a certain amount of time and I do not have any openings but this is the second 1 in a few weeks that I know of.  Hospital follow-up should be done with me if possible.

## 2021-06-04 NOTE — Progress Notes (Signed)
Subjective:    Patient ID: Brenda Grimes, female    DOB: 1965/08/12, 56 y.o.   MRN: [id number]  Brenda Grimes is a 56 y.o. female presenting on 06/04/2021 for Hospitalization Follow-up  PCP Webb Silversmith, FNP   HPI  ED FOLLOW-UP VISIT  Hospital/Location: [facility] Date of ED Visit: 06/02/21  Reason for Presenting to ED: Excessive Bleeding from blood blister  FOLLOW-UP  - ED provider note and record have been reviewed - Patient presents today about 2 days after recent ED visit. Brief summary of recent course, patient had symptoms of acute bleeding from vaginal external blood blister cyst she was losing blood that soaked 3 pads. She has multiple areas of similar blood blisters. She felt weak and tired with the blood loss, went to hospital. They stopped the bleeding with surgicel, identified 1-2 mm blood blister located at R labia.  Concerned about her live function, with history of cancer treatment.  - Today reports overall has done well after discharge from ED. Symptoms of bleeding have RESOLVED. She has other small flush with skin blood blisters or capillary hemangioma on abdomen nothing that has evidence of possible bleeding.  Has not discussed this with GYN, due to acute onset, they checked it out previously but it was for different issue. Not this same bleeding.  Today feels somewhat better less dizzy and better overall. She remained OFF her supplements for past few days caution with those to avoid provoking bleeding was taking B complex VIt D and Tumeric   I have reviewed the discharge medication list, and have reconciled the current and discharge medications today.   I have reviewed the discharge medication list, and have reconciled the current and discharge medications today.   Current Outpatient Medications:    acetaminophen (TYLENOL) 500 MG tablet, Take 1,000 mg by mouth every 6 (six) hours as needed., Disp: , Rfl:    buPROPion (WELLBUTRIN XL) 150 MG 24 hr tablet, Take 1 tablet  by mouth once daily, Disp: 90 tablet, Rfl: 0   levothyroxine (SYNTHROID) 88 MCG tablet, TAKE 1 TABLET BY MOUTH ONCE DAILY BEFORE BREAKFAST, Disp: 90 tablet, Rfl: 0   loperamide (IMODIUM) 2 MG capsule, Take 2 mg by mouth as needed for diarrhea or loose stools., Disp: , Rfl:    naproxen (NAPROSYN) 500 MG tablet, Take 500 mg by mouth 3 (three) times daily as needed., Disp: , Rfl:    traMADol (ULTRAM) 50 MG tablet, Take 1 tablet (50 mg total) by mouth every 12 (twelve) hours as needed for moderate pain (abdominal or rectal pain)., Disp: 60 tablet, Rfl: 0   venlafaxine XR (EFFEXOR XR) 37.5 MG 24 hr capsule, Take 1 capsule (37.5 mg total) by mouth daily with breakfast., Disp: 30 capsule, Rfl: 2   vitamin B-12 (CYANOCOBALAMIN) 1000 MCG tablet, Take 1,000 mcg by mouth daily., Disp: , Rfl:   ------------------------------------------------------------------------- Social History   Tobacco Use   Smoking status: Former    Packs/day: 0.75    Years: 30.00    Pack years: 22.50    Types: Cigarettes    Quit date: 01/21/2007    Years since quitting: 14.3   Smokeless tobacco: Never  Vaping Use   Vaping Use: Never used  Substance Use Topics   Alcohol use: No   Drug use: No    Review of Systems Per HPI unless specifically indicated above     Objective:    BP 114/69    Pulse 85    Ht 5\' 4"  (1.626 m)  Wt 180 lb 6.4 oz (81.8 kg)    SpO2 100%    BMI 30.97 kg/m   Wt Readings from Last 3 Encounters:  06/04/21 180 lb 6.4 oz (81.8 kg)  06/02/21 173 lb (78.5 kg)  04/17/21 172 lb (78 kg)    Physical Exam Vitals and nursing note reviewed.  Constitutional:      General: She is not in acute distress.    Appearance: Normal appearance. She is well-developed. She is not diaphoretic.     Comments: Well-appearing, comfortable, cooperative  HENT:     Head: Normocephalic and atraumatic.  Eyes:     General:        Right eye: No discharge.        Left eye: No discharge.     Conjunctiva/sclera:  Conjunctivae normal.  Cardiovascular:     Rate and Rhythm: Normal rate.  Pulmonary:     Effort: Pulmonary effort is normal.  Skin:    General: Skin is warm and dry.     Findings: Lesion (pin point hemangiomas on abdomen no sign of bleeding) present. No erythema or rash.  Neurological:     Mental Status: She is alert and oriented to person, place, and time.  Psychiatric:        Mood and Affect: Mood normal.        Behavior: Behavior normal.        Thought Content: Thought content normal.     Comments: Well groomed, good eye contact, normal speech and thoughts      Results for orders placed or performed during the hospital encounter of 04/17/21  Surgical pathology  Result Value Ref Range   SURGICAL PATHOLOGY      SURGICAL PATHOLOGY CASE: [id number] PATIENT: [facility] Surgical Pathology Report     Specimen Submitted: A. Colon, random;cbx B. Colon polyp x2, cecum; cold snare C. Rectum; cbx  Clinical History: Diarrhea. History of colon polyps. 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 Finding: Colon polyps.     DIAGNOSIS: A.  COLON; RANDOM COLD BIOPSY: - COLONIC MUCOSA WITH INTACT CRYPT ARCHITECTURE. - NEGATIVE FOR MICROSCOPIC COLITIS, DYSPLASIA, AND MALIGNANCY.  B.  COLON POLYP X 2, CECUM; COLD SNARE: - TUBULAR ADENOMA, ONE FRAGMENT, NEGATIVE FOR HIGH-GRADE DYSPLASIA AND MALIGNANCY. - ONE MUCOSAL FRAGMENT WITH NONSPECIFIC HYPERPLASTIC CHANGES AND MULTIPLE UNREMARKABLE MUCOSAL FRAGMENTS, NEGATIVE FOR DYSPLASIA AND MALIGNANCY.  C.  RECTUM; COLD BIOPSY: - NONSPECIFIC HYPERPLASTIC CHANGES. - NEGATIVE FOR PROCTITIS, DYSPLASIA AND MALIGNANCY.  GROSS DESCRIPTION: A. Labeled: Random colon biopsy Rec eived: Formalin Collection time: 11:22 AM on 04/17/2021 Placed into formalin time: 11:22 AM on 04/17/2021 Tissue fragment(s): Multiple Size: Aggregate, 1.3 x 0.5 x 0.1 cm Description: Tan soft tissue  fragments Entirely submitted in 1 cassette.  B. Labeled: Cecal polyp x2 Received: Formalin Collection time: 11:27 AM on 04/17/2021 Placed into formalin time: 11:27 AM on 04/17/2021 Tissue fragment(s): Multiple Size: Aggregate, 1.2 x 0.5 x 0.1 cm Description: Tan soft tissue fragments Entirely submitted in 1 cassette.  C. Labeled: Rectum biopsy Received: Formalin Collection time: 11:38 AM on 04/17/2021 Placed into formalin time: 11:38 AM on 04/17/2021 Tissue fragment(s): 2 Size: Ranges from 0.3-0.4 cm Description: Tan soft tissue fragments Entirely submitted in 1 cassette.  [facility] 04/18/2021  Final Diagnosis performed by Bryan Lemma, MD.   Electronically signed 04/21/2021 6:09:30PM The electronic signature indicates that the named Attending Pathologist has evaluate d the specimen Technical component performed at [facility], [address],  [place], [place] [address] Lab: [phone] Dir: Rush Farmer, MD, MMM  Professional component performed at [facility], [facility], [place], [place], [place] [address] Lab: [phone] Dir: Kathi Simpers, MD       Assessment & Plan:   Problem List Items Addressed This Visit   None Visit Diagnoses     Acute blood loss anemia    -  Primary   Relevant Orders   COMPLETE METABOLIC PANEL WITH GFR   CBC with Differential/Platelet   Iron, TIBC and Ferritin Panel   Protime-INR   Blood blister       Excessive bleeding       Relevant Orders   CBC with Differential/Platelet   Iron, TIBC and Ferritin Panel   Protime-INR       Hemodynamically stable today, no tachycardia Hemostasis occurred in ED Reassurance spots on abdomen are small superficial skin hemangioma, uncertain if lesion in vaginal area is same or other type of lesion  Will evaluate for anemia, or cause of bleeding. Stay tuned on blood test results in 24-48 hours on mychart.  If no cause of excessive bleeding identified then may just be spontaneous  issue or unrelated to other health.  Make sure to follow up with GYN in near future if further concerns.  No orders of the defined types were placed in this encounter.   Follow up plan: Return if symptoms worsen or fail to improve.   [name], DO [facility] 06/04/2021, 10:16 AM

## 2021-06-05 ENCOUNTER — Inpatient Hospital Stay: Payer: BC Managed Care – PPO | Admitting: Internal Medicine

## 2021-06-06 ENCOUNTER — Other Ambulatory Visit: Payer: Self-pay | Admitting: Internal Medicine

## 2021-06-06 DIAGNOSIS — F339 Major depressive disorder, recurrent, unspecified: Secondary | ICD-10-CM

## 2021-06-06 DIAGNOSIS — E039 Hypothyroidism, unspecified: Secondary | ICD-10-CM

## 2021-06-06 NOTE — Telephone Encounter (Signed)
Requested medications are due for refill today.  Synthroid- yes, Wellbutrin - no  Requested medications are on the active medications list.  yes  Last refill. Synthroid 03/07/2021 #90 0 refills, Wellbutrin 90/0 05/27/2021  Future visit scheduled.   yes  Notes to clinic.  Synthroid failed protocol d/t expired labs.   Wellbutrin just refilled 05/27/2021.    Requested Prescriptions  Pending Prescriptions Disp Refills   levothyroxine (SYNTHROID) 88 MCG tablet [Pharmacy Med Name: Levothyroxine Sodium 88 MCG Oral Tablet] 90 tablet 0    Sig: TAKE 1 TABLET BY MOUTH ONCE DAILY BEFORE BREAKFAST     Endocrinology:  Hypothyroid Agents Failed - 06/06/2021  2:45 AM      Failed - TSH in normal range and within 360 days    TSH  Date Value Ref Range Status  04/04/2020 1.28 0.40 - 4.50 mIU/L Final          Passed - Valid encounter within last 12 months    Recent Outpatient Visits           2 days ago Acute blood loss anemia   [facility] [place], Devonne Doughty, DO   4 months ago Bowel habit changes   [facility] [place], Coralie Keens, NP   8 months ago Hot flashes due to menopause   [facility] [place], Coralie Keens, NP   10 months ago Depression, unspecified depression type   [place], [place], [place]   10 months ago Anal cancer [facility])   [facility] [place], Devonne Doughty, DO       Future Appointments             In 1 month [facility]              buPROPion (WELLBUTRIN XL) 150 MG 24 hr tablet [Pharmacy Med Name: buPROPion HCl ER (XL) 150 MG Oral Tablet Extended Release 24 Hour] 90 tablet 0    Sig: Take 1 tablet by mouth once daily     Psychiatry: Antidepressants - bupropion Passed - 06/06/2021  2:45 AM      Passed - Cr in normal range and within 360 days    Creat  Date Value Ref Range Status  02/04/2021 0.75 0.50 - 1.03 mg/dL Final          Passed - AST in normal range and  within 360 days    AST  Date Value Ref Range Status  02/04/2021 16 10 - 35 U/L Final          Passed - ALT in normal range and within 360 days    ALT  Date Value Ref Range Status  02/04/2021 12 6 - 29 U/L Final          Passed - Completed PHQ-2 or PHQ-9 in the last 360 days      Passed - Last BP in normal range    BP Readings from Last 1 Encounters:  06/04/21 114/69          Passed - Valid encounter within last 6 months    Recent Outpatient Visits           2 days ago Acute blood loss anemia   [place], Devonne Doughty, DO   4 months ago Bowel habit changes   [facility] [place], [place], NP   8 months ago Hot flashes due to menopause   [place], NP   10 months ago Depression, unspecified depression type   [facility] [place], [place]   10 months ago Anal cancer [facility])   [facility] [place], Devonne Doughty, DO       Future Appointments             In 1 month [facility], [facility]

## 2021-06-19 ENCOUNTER — Encounter: Payer: Self-pay | Admitting: Gastroenterology

## 2021-06-19 ENCOUNTER — Telehealth: Payer: Self-pay

## 2021-06-19 NOTE — Telephone Encounter (Signed)
Pt had a referral to [facility] and she states they can not see her until June, wanted to see if someone else could send in a different referral, she needs vaginal reconstruction surgery, I advised her she may need to keep the appointment she has.

## 2021-06-19 NOTE — Telephone Encounter (Signed)
Pt aware.

## 2021-06-19 NOTE — Telephone Encounter (Signed)
Agree w this referral direction, as dr Georgianne Fick has arranged

## 2021-07-01 ENCOUNTER — Encounter: Payer: Self-pay | Admitting: Internal Medicine

## 2021-07-22 ENCOUNTER — Ambulatory Visit: Payer: Medicare Other

## 2021-07-23 NOTE — Progress Notes (Signed)
This encounter was created in error - please disregard.

## 2021-07-24 ENCOUNTER — Telehealth: Payer: Self-pay

## 2021-07-24 ENCOUNTER — Other Ambulatory Visit: Payer: Self-pay

## 2021-07-24 ENCOUNTER — Encounter: Payer: Self-pay | Admitting: Physician Assistant

## 2021-07-24 ENCOUNTER — Ambulatory Visit (INDEPENDENT_AMBULATORY_CARE_PROVIDER_SITE_OTHER): Payer: BC Managed Care – PPO | Admitting: Physician Assistant

## 2021-07-24 VITALS — BP 104/40 | HR 85 | Ht 62.5 in | Wt 181.8 lb

## 2021-07-24 DIAGNOSIS — I25119 Atherosclerotic heart disease of native coronary artery with unspecified angina pectoris: Secondary | ICD-10-CM

## 2021-07-24 DIAGNOSIS — D62 Acute posthemorrhagic anemia: Secondary | ICD-10-CM | POA: Diagnosis not present

## 2021-07-24 DIAGNOSIS — R0789 Other chest pain: Secondary | ICD-10-CM

## 2021-07-24 DIAGNOSIS — R58 Hemorrhage, not elsewhere classified: Secondary | ICD-10-CM | POA: Diagnosis not present

## 2021-07-24 NOTE — Telephone Encounter (Signed)
The pt came in today for a scheduled Medicare Wellness visit. The visit was cancelled after verification that the patient no longer had medicare Part B. While in the office the patient complained of intermittent episodes of chest pressure on the left side lasting about 5 minutes x 2 weeks. The pressure sensation doesn't radiate or intensify. She said the pain even happen at rest. History of MI with 2 stents placed. The patient was last seen by a cardiologist x 3 -4 years ago. Appt scheduled for today at 2:00pm. Pt notified if her symptoms worsen to seek emergent care.  ?

## 2021-07-24 NOTE — Progress Notes (Signed)
? ? ?  ?    Acute Office Visit ? ? ?Patient: Brenda Grimes   DOB: Dec 20, 1965   56 y.o. Female  MRN: [id number] ?Visit Date: 07/24/2021 ? ?Today's healthcare provider: Dani Gobble Brigett Estell, PA-C  ?Introduced myself to the patient as a Journalist, newspaper and provided education on APPs in clinical practice.  ? ? ?Chief Complaint  ?Patient presents with  ? chest discomfort  ? ?Subjective  ?  ?HPI  ? ?States she has intermittent chest pain that feels like a pressure over her left side ?Reports it has been happening intermittently for several weeks ?Denies pain in shoulder, arm, jaw or neck ?States she always has back pain so she is not sure if that is an associated sign ?States she does have some chest tightness with breathing when these episodes occur.  ? ?Aggravating: does not seem to have triggers or aggravating factors, she is not sure if exertion makes it worse ?Alleviating: states she usually waits to see if it will resolve. ?States pain usually resolves in about 5-10 minutes ?States she had her stents placed years ago she called EMS - ekg was fine at that time ? ?States the most recent episode occurred when she was driving to office earlier this AM ? ? ?Past Surgical History:  ?Procedure Laterality Date  ? CARDIAC SURGERY    ? COLONOSCOPY WITH PROPOFOL N/A 04/17/2021  ? Procedure: COLONOSCOPY WITH BIOPSY;  Surgeon: Lin Landsman, MD;  Location: [place];  Service: Endoscopy;  Laterality: N/A;  ? CORONARY ANGIOPLASTY WITH STENT PLACEMENT    ? POLYPECTOMY N/A 04/17/2021  ? Procedure: POLYPECTOMY;  Surgeon: Lin Landsman, MD;  Location: [place];  Service: Endoscopy;  Laterality: N/A;  ? TUBAL LIGATION    ? ? ? ?Medications: ?Outpatient Medications Prior to Visit  ?Medication Sig  ? acetaminophen (TYLENOL) 500 MG tablet Take 1,000 mg by mouth every 6 (six) hours as needed.  ? buPROPion (WELLBUTRIN XL) 150 MG 24 hr tablet Take 1 tablet by mouth once daily  ? levothyroxine (SYNTHROID) 88 MCG tablet TAKE 1  TABLET BY MOUTH ONCE DAILY BEFORE BREAKFAST  ? loperamide (IMODIUM) 2 MG capsule Take 2 mg by mouth as needed for diarrhea or loose stools.  ? naproxen (NAPROSYN) 500 MG tablet Take 500 mg by mouth 3 (three) times daily as needed.  ? venlafaxine XR (EFFEXOR XR) 37.5 MG 24 hr capsule Take 1 capsule (37.5 mg total) by mouth daily with breakfast. (Patient taking differently: Take 37.5 mg by mouth daily as needed.)  ? vitamin B-12 (CYANOCOBALAMIN) 1000 MCG tablet Take 1,000 mcg by mouth daily.  ? ?No facility-administered medications prior to visit.  ? ? ?Review of Systems  ?Constitutional:  Negative for diaphoresis.  ?Respiratory:  Positive for shortness of breath.   ?Cardiovascular:  Positive for chest pain and palpitations.  ?Musculoskeletal:  Positive for back pain. Negative for neck pain and neck stiffness.  ?Neurological:  Negative for dizziness, syncope, light-headedness and headaches.  ?Psychiatric/Behavioral:  Negative for agitation and confusion. The patient is not nervous/anxious.   ? ? ?  Objective  ?  ?BP (!) 104/40   Pulse 85   Ht 5' 2.5" (1.588 m)   Wt 181 lb 12.8 oz (82.5 kg)   SpO2 100%   BMI 32.72 kg/m?  ? ? ?Physical Exam ?Vitals reviewed.  ?Constitutional:   ?   General: She is awake.  ?   Appearance: Normal appearance. She is well-developed and well-groomed. She is obese.  ?  HENT:  ?   Head: Normocephalic and atraumatic.  ?Cardiovascular:  ?   Rate and Rhythm: Normal rate and regular rhythm.  ?   Pulses: Normal pulses.     ?     Carotid pulses are 2+ on the right side and 2+ on the left side. ?     Radial pulses are 2+ on the right side and 2+ on the left side.  ?   Heart sounds: Normal heart sounds.  ?   Comments: Left leg is 39 cm in width around calf ?Right leg is 38 cm in width around calf ?No carotid bruits on auscultation  ? ?Pulmonary:  ?   Effort: Pulmonary effort is normal.  ?   Breath sounds: Normal breath sounds. No decreased air movement. No decreased breath sounds, wheezing, rhonchi  or rales.  ?Musculoskeletal:  ?   Right lower leg: 1+ Pitting Edema present.  ?   Left lower leg: 1+ Pitting Edema present.  ?Neurological:  ?   Mental Status: She is alert.  ?Psychiatric:     ?   Mood and Affect: Mood normal.     ?   Behavior: Behavior normal. Behavior is cooperative.     ?   Thought Content: Thought content normal.     ?   Judgment: Judgment normal.  ?  ? ? ?No results found for any visits on 07/24/21. ? Assessment & Plan  ?  ? ? ?Problem List Items Addressed This Visit   ?None ?Visit Diagnoses   ? ? Intermittent left-sided chest pain    -  Primary ?Acute and intermittently occurring without discernable triggers ?Patient has pertinent hx of coronary angioplasty with stent placement  ?EKG was reassuring with normal sinus rhythm, rate of 85 bpm, potential RBBB in V1 - compared to previous EKG on 03/24/2017 - no concerning changes  ?Patient is not positive for Wells or PERC scores at this time.  ?Will add Troponin to her previously drawn lab work from this AM per patient preference ?Discussed that EKG is a tool but does not definitively rule out infarction and if she continues to have concerns or repeat chest pain episodes she should go to the ED for prompt eval ?Discussed sending to cardiology and ordering stress test - patient declined at this time saying she "hates stress tests" and would prefer to wait unless episodes become more problematic ?Recommend watchful monitoring and use of ED for repeat episodes to prevent longterm cardiac repercussions   ? Relevant Orders  ? Troponin I  ? EKG 12-Lead  ? ?  ? ? ? ?No follow-ups on file. ? ? ?I, Jeanpierre Thebeau E Shirah Roseman, PA-C, have reviewed all documentation for this visit. The documentation on 07/24/21 for the exam, diagnosis, procedures, and orders are all accurate and complete. ? ? ?Gerianne Simonet, Glennie Isle MPH ?[place] ?[facility] ?

## 2021-07-25 LAB — COMPLETE METABOLIC PANEL WITH GFR
AG Ratio: 2.2 (calc) (ref 1.0–2.5)
ALT: 10 U/L (ref 6–29)
AST: 16 U/L (ref 10–35)
Albumin: 4.1 g/dL (ref 3.6–5.1)
Alkaline phosphatase (APISO): 75 U/L (ref 37–153)
BUN: 18 mg/dL (ref 7–25)
CO2: 29 mmol/L (ref 20–32)
Calcium: 9 mg/dL (ref 8.6–10.4)
Chloride: 109 mmol/L (ref 98–110)
Creat: 0.76 mg/dL (ref 0.50–1.03)
Globulin: 1.9 g/dL (calc) (ref 1.9–3.7)
Glucose, Bld: 87 mg/dL (ref 65–99)
Potassium: 4.8 mmol/L (ref 3.5–5.3)
Sodium: 143 mmol/L (ref 135–146)
Total Bilirubin: 0.4 mg/dL (ref 0.2–1.2)
Total Protein: 6 g/dL — ABNORMAL LOW (ref 6.1–8.1)
eGFR: 91 mL/min/{1.73_m2} (ref >=60)

## 2021-07-25 LAB — LIPID PANEL
Cholesterol: 194 mg/dL (ref <200)
HDL: 66 mg/dL (ref >=50)
LDL Cholesterol (Calc): 108 mg/dL (calc) — ABNORMAL HIGH
Non-HDL Cholesterol (Calc): 128 mg/dL (calc) (ref <130)
Total CHOL/HDL Ratio: 2.9 (calc) (ref <5.0)
Triglycerides: 103 mg/dL (ref <150)

## 2021-07-25 LAB — IRON,TIBC AND FERRITIN PANEL
%SAT: 18 % (calc) (ref 16–45)
Ferritin: 39 ng/mL (ref 16–232)
Iron: 67 ug/dL (ref 45–160)
TIBC: 364 mcg/dL (calc) (ref 250–450)

## 2021-07-25 LAB — CBC WITH DIFFERENTIAL/PLATELET
Absolute Monocytes: 279 cells/uL (ref 200–950)
Basophils Absolute: 39 cells/uL (ref 0–200)
Basophils Relative: 1.3 %
Eosinophils Absolute: 60 cells/uL (ref 15–500)
Eosinophils Relative: 2 %
HCT: 37.7 % (ref 35.0–45.0)
Hemoglobin: 12.5 g/dL (ref 11.7–15.5)
Lymphs Abs: 921 cells/uL (ref 850–3900)
MCH: 33.3 pg — ABNORMAL HIGH (ref 27.0–33.0)
MCHC: 33.2 g/dL (ref 32.0–36.0)
MCV: 100.5 fL — ABNORMAL HIGH (ref 80.0–100.0)
MPV: 9.3 fL (ref 7.5–12.5)
Monocytes Relative: 9.3 %
Neutro Abs: 1701 cells/uL (ref 1500–7800)
Neutrophils Relative %: 56.7 %
Platelets: 189 10*3/uL (ref 140–400)
RBC: 3.75 10*6/uL — ABNORMAL LOW (ref 3.80–5.10)
RDW: 12.3 % (ref 11.0–15.0)
Total Lymphocyte: 30.7 %
WBC: 3 10*3/uL — ABNORMAL LOW (ref 3.8–10.8)

## 2021-07-25 LAB — PROTIME-INR
INR: 0.9
Prothrombin Time: 9.3 s (ref 9.0–11.5)

## 2021-07-31 ENCOUNTER — Encounter: Payer: Self-pay | Admitting: Physician Assistant

## 2021-07-31 DIAGNOSIS — I25119 Atherosclerotic heart disease of native coronary artery with unspecified angina pectoris: Secondary | ICD-10-CM

## 2021-07-31 DIAGNOSIS — R0789 Other chest pain: Secondary | ICD-10-CM

## 2021-08-01 ENCOUNTER — Telehealth: Payer: Self-pay

## 2021-08-01 NOTE — Telephone Encounter (Signed)
Copied from [place]. Topic: General - Other ?>> Aug 01, 2021  1:48 PM Alanda Slim E wrote: ?Reason for CRM: [facility] clinic cardiology needs the last EKG faxed to them . They will be seeing the pt On Monday 4.3.23/ please fax today asap to Fax# [phone]/ please advise ?

## 2021-08-01 NOTE — Telephone Encounter (Signed)
Left detailed message (Per DPR) for pt to call back or to check her MyChart message. PEC if pt calls back please advise her she needs to get labs repeated before 12 today.  Also advise pt of provider recommendations.  ? ?Thanks,  ? ?-Mickel Baas  ?

## 2021-08-01 NOTE — Addendum Note (Signed)
Addended by: Talitha Givens on: 08/01/2021 10:20 AM ? ? Modules accepted: Orders ? ?

## 2021-08-04 DIAGNOSIS — Z955 Presence of coronary angioplasty implant and graft: Secondary | ICD-10-CM | POA: Insufficient documentation

## 2021-08-04 DIAGNOSIS — R0789 Other chest pain: Secondary | ICD-10-CM | POA: Diagnosis not present

## 2021-08-04 DIAGNOSIS — R079 Chest pain, unspecified: Secondary | ICD-10-CM | POA: Diagnosis not present

## 2021-08-04 DIAGNOSIS — E7800 Pure hypercholesterolemia, unspecified: Secondary | ICD-10-CM | POA: Insufficient documentation

## 2021-08-04 DIAGNOSIS — R0602 Shortness of breath: Secondary | ICD-10-CM | POA: Insufficient documentation

## 2021-08-08 ENCOUNTER — Emergency Department
Admission: EM | Admit: 2021-08-08 | Discharge: 2021-08-08 | Disposition: A | Discharge status: Home / Self Care | Payer: BC Managed Care – PPO | Attending: Emergency Medicine | Admitting: Emergency Medicine

## 2021-08-08 ENCOUNTER — Emergency Department: Payer: BC Managed Care – PPO

## 2021-08-08 ENCOUNTER — Other Ambulatory Visit: Payer: Self-pay

## 2021-08-08 ENCOUNTER — Encounter: Payer: Self-pay | Admitting: Emergency Medicine

## 2021-08-08 DIAGNOSIS — E039 Hypothyroidism, unspecified: Secondary | ICD-10-CM | POA: Diagnosis not present

## 2021-08-08 DIAGNOSIS — R0789 Other chest pain: Secondary | ICD-10-CM | POA: Insufficient documentation

## 2021-08-08 DIAGNOSIS — R0602 Shortness of breath: Secondary | ICD-10-CM | POA: Diagnosis not present

## 2021-08-08 DIAGNOSIS — R079 Chest pain, unspecified: Secondary | ICD-10-CM | POA: Diagnosis not present

## 2021-08-08 DIAGNOSIS — R7989 Other specified abnormal findings of blood chemistry: Secondary | ICD-10-CM | POA: Diagnosis not present

## 2021-08-08 DIAGNOSIS — Z85048 Personal history of other malignant neoplasm of rectum, rectosigmoid junction, and anus: Secondary | ICD-10-CM | POA: Insufficient documentation

## 2021-08-08 LAB — CBC
HCT: 36.7 % (ref 36.0–46.0)
Hemoglobin: 11.8 g/dL — ABNORMAL LOW (ref 12.0–15.0)
MCH: 32.6 pg (ref 26.0–34.0)
MCHC: 32.2 g/dL (ref 30.0–36.0)
MCV: 101.4 fL — ABNORMAL HIGH (ref 80.0–100.0)
Platelets: 188 10*3/uL (ref 150–400)
RBC: 3.62 MIL/uL — ABNORMAL LOW (ref 3.87–5.11)
RDW: 12.9 % (ref 11.5–15.5)
WBC: 3.7 10*3/uL — ABNORMAL LOW (ref 4.0–10.5)
nRBC: 0 % (ref 0.0–0.2)

## 2021-08-08 LAB — BASIC METABOLIC PANEL
Anion gap: 8 (ref 5–15)
BUN: 16 mg/dL (ref 6–20)
CO2: 26 mmol/L (ref 22–32)
Calcium: 8.8 mg/dL — ABNORMAL LOW (ref 8.9–10.3)
Chloride: 106 mmol/L (ref 98–111)
Creatinine, Ser: 0.75 mg/dL (ref 0.44–1.00)
GFR, Estimated: >60 mL/min (ref >60)
Glucose, Bld: 89 mg/dL (ref 70–99)
Potassium: 3.8 mmol/L (ref 3.5–5.1)
Sodium: 140 mmol/L (ref 135–145)

## 2021-08-08 LAB — TROPONIN I (HIGH SENSITIVITY)
Troponin I (High Sensitivity): 3 ng/L (ref <18)
Troponin I (High Sensitivity): 3 ng/L (ref <18)

## 2021-08-08 LAB — BRAIN NATRIURETIC PEPTIDE: B Natriuretic Peptide: 70.8 pg/mL (ref 0.0–100.0)

## 2021-08-08 MED ORDER — AMOXICILLIN 500 MG PO CAPS: 1000.0000 mg | ORAL_CAPSULE | Freq: Three times a day (TID) | ORAL | 0 refills | Status: AC

## 2021-08-08 MED ORDER — IOHEXOL 350 MG/ML SOLN
75.0000 mL | Freq: Once | INTRAVENOUS | Status: AC | PRN
  Administered 2021-08-08: 75 mL via INTRAVENOUS
  Filled 2021-08-08: qty 75

## 2021-08-08 NOTE — ED Provider Notes (Signed)
? ?[facility] ?Provider Note ? ? ? Event Date/Time  ? First MD Initiated Contact with Patient 08/08/21 1535   ?  (approximate) ? ? ?History  ? ?Chief Complaint ?Chest Pain ? ? ?HPI ?Brenda Grimes is a 56 y.o. female, history of hypothyroidism, depression, anal cancer, hyperlipidemia, MI, presents emergency department for evaluation of chest pain.  Patient states she woke up this morning with her hands and feet swollen.  When she got to work, she began experiencing centralized chest pain associated with shortness of breath.  Describes pain as pressure-like sensation, particularly along the sternum and just left to the sternum.  She states that it is starting to get slightly better since earlier.  She took ibuprofen and baby aspirin this morning following her pain.  Denies fever/chills, cough, congestion, abdominal pain, flank pain, nausea/vomiting, diarrhea, urinary symptoms, rashes/lesions, or dizziness/lightheadedness. ? ?History Limitations: No limitations. ? ?    ? ? ?Physical Exam  ?Triage Vital Signs: ?ED Triage Vitals  ?Enc Vitals Group  ?   BP 08/08/21 1504 129/82  ?   Pulse Rate 08/08/21 1504 63  ?   Resp 08/08/21 1504 18  ?   Temp 08/08/21 1504 (!) 97.5 ?F (36.4 ?C)  ?   Temp Source 08/08/21 1504 Oral  ?   SpO2 08/08/21 1504 100 %  ?   Weight 08/08/21 1536 181 lb 14.1 oz (82.5 kg)  ?   Height 08/08/21 1536 5' 2.25" (1.581 m)  ?   Head Circumference --   ?   Peak Flow --   ?   Pain Score 08/08/21 1508 7  ?   Pain Loc --   ?   Pain Edu? --   ?   Excl. in [place]? --   ? ? ?Most recent vital signs: ?Vitals:  ? 08/08/21 1708 08/08/21 1836  ?BP: 130/84 130/79  ?Pulse: 70 73  ?Resp: 16 16  ?Temp: (!) 97.5 ?F (36.4 ?C)   ?SpO2: 99% 100%  ? ? ?General: Awake, NAD.  ?Skin: Warm, dry. No rashes or lesions.  ?Eyes: PERRL. Conjunctivae normal.  ?ENT: Throat clear, no erythema or exudates. Uvula midline.  ?Neck: Normal ROM. No nuchal rigidity.  ?CV: Good peripheral perfusion.  S1 and S2 present.  No  murmurs, rubs, or gallops. ?Resp: Normal effort.  Lung sounds are clear bilaterally in the apices and bases. ?Abd: Soft, non-tender. No distention.  ?Neuro: At baseline. No gross neurological deficits.  ?MSK: No gross deformities. Normal ROM in all extremities.  ? ? ?Physical Exam ? ? ? ?ED Results / Procedures / Treatments  ?Labs ?(all labs ordered are listed, but only abnormal results are displayed) ?Labs Reviewed  ?BASIC METABOLIC PANEL - Abnormal; Notable for the following components:  ?    Result Value  ? Calcium 8.8 (*)   ? All other components within normal limits  ?CBC - Abnormal; Notable for the following components:  ? WBC 3.7 (*)   ? RBC 3.62 (*)   ? Hemoglobin 11.8 (*)   ? MCV 101.4 (*)   ? All other components within normal limits  ?BRAIN NATRIURETIC PEPTIDE  ?POC URINE PREG, ED  ?TROPONIN I (HIGH SENSITIVITY)  ?TROPONIN I (HIGH SENSITIVITY)  ? ? ? ?EKG ?Sinus rhythm, rate of 68, incomplete right bundle branch block, no axis deviations, no ST segment changes. ? ? ?RADIOLOGY ? ?ED Provider Interpretation: I personally reviewed and interpreted this chest x-ray.  Consolidation noted along the left perihilar region. ? ?DG  Chest 2 View ? ?Result Date: 08/08/2021 ?CLINICAL DATA:  Pt reports she woke up this morning and her hands and feet were swollen. Pt states she started having centralized CP and SOB when she got to work. EXAM: CHEST - 2 VIEW.  Patient is slightly rotated on frontal view. COMPARISON:  Chest x-ray 03/23/2017. CT chest 08/05/2006 FINDINGS: The heart and mediastinal contours are within normal limits. Interval development of a left perihilar airspace opacity. No pulmonary edema. No pleural effusion. No pneumothorax. No acute osseous abnormality. IMPRESSION: Interval development of a left perihilar airspace opacity. Finding could represent developing focal consolidation. Followup PA and lateral chest X-ray is recommended in 3-4 weeks following therapy to ensure resolution and exclude underlying  malignancy. Electronically Signed   By: Iven Finn M.D.   On: 08/08/2021 15:41  ? ?CT Angio Chest PE W/Cm &/Or Wo Cm ? ?Result Date: 08/08/2021 ?CLINICAL DATA:  Pulmonary embolism (PE) suspected, unknown D-dimer. Pt reports she woke up this morning and her hands and feet were swollen. Pt states she started having centralized CP and SOB when she got to work EXAM: CT ANGIOGRAPHY CHEST WITH CONTRAST TECHNIQUE: Multidetector CT imaging of the chest was performed using the standard protocol during bolus administration of intravenous contrast. Multiplanar CT image reconstructions and MIPs were obtained to evaluate the vascular anatomy. RADIATION DOSE REDUCTION: This exam was performed according to the departmental dose-optimization program which includes automated exposure control, adjustment of the mA and/or kV according to patient size and/or use of iterative reconstruction technique. CONTRAST:  58m OMNIPAQUE IOHEXOL 350 MG/ML SOLN COMPARISON:  None. FINDINGS: Cardiovascular: Satisfactory opacification of the pulmonary arteries to the segmental level. No evidence of pulmonary embolism. The main pulmonary artery is normal in caliber. Normal heart size. No significant pericardial effusion. The thoracic aorta is normal in caliber. No atherosclerotic plaque of the thoracic aorta. No coronary artery calcifications. Mediastinum/Nodes: Left posterior fat containing Bochdalek's hernia. No enlarged mediastinal, hilar, or axillary lymph nodes. Thyroid gland, trachea, and esophagus demonstrate no significant findings. Lungs/Pleura: Expiratory phase of respiration with a mosaic attenuation of the lungs. No focal consolidation. No pulmonary nodule. No pulmonary mass. No pleural effusion. No pneumothorax. Diffuse bronchial wall thickening. Upper Abdomen: No acute abnormality. Musculoskeletal: No chest wall abnormality. No suspicious lytic or blastic osseous lesions. No acute displaced fracture. Review of the MIP images confirms  the above findings. IMPRESSION: 1. No pulmonary embolus. 2. Diffuse bronchial wall thickening with a mosaic attenuation of the lungs suggestive of small airway disease. Electronically Signed   By: MIven FinnM.D.   On: 08/08/2021 17:58   ? ?PROCEDURES: ? ?Critical Care performed: None. ? ?Procedures ? ? ? ?MEDICATIONS ORDERED IN ED: ?Medications  ?iohexol (OMNIPAQUE) 350 MG/ML injection 75 mL (75 mLs Intravenous Contrast Given 08/08/21 1731)  ? ? ? ?IMPRESSION / MDM / ASSESSMENT AND PLAN / ED COURSE  ?I reviewed the triage vital signs and the nursing notes. ?             ?               ? ?Differential diagnosis includes, but is not limited to, ACS, PE, myocarditis/pericarditis, anxiety/depression, gastritis, GERD, CHF, viral respiratory illness. ? ?ED Course ?Patient appears well.  Vital signs within normal limits.  NAD. ? ?CBC shows no leukocytosis or clinically significant anemia. ? ?BMP unremarkable for electrolyte abnormalities, or kidney injury. ? ?BNP unremarkable at 70.8.  Unlikely CHF exacerbation. ? ?Initial EKG unremarkable.  Initial troponin 3.  Second troponin 3.  Unlikely ACS or marked/pericarditis. ? ?Chest x-ray shows findings suggestive of focal consolidation.  Patient does not have any signs or symptoms consistent with pneumonia.  Given the patient's age and risk factors, we will go ahead and order CT angio to further evaluate for PE versus malignancy. ? ?Assessment/Plan ?Patient presents with sudden onset of limb swelling, chest pain, shortness of breath that began this morning and has resolved gradually over time.  There is evidence of possible focal consolidation versus small airway disease on imaging. No evidence of pulmonary embolism.  Lab work-up has been reassuring.  No evidence of ACS or myocarditis/pericarditis. ? ?On reexamination, patient additionally stated that her eyes were puffy as well during this episode.  Her symptoms may be attributable to possible allergic reaction, though  patient cannot think of any potential exposures.  There is evidence of possible infection, though patient is not experiencing any classic symptoms of pneumonia.  Given her age, we will go ahead and cover her

## 2021-08-08 NOTE — Discharge Instructions (Addendum)
-  Take all of your medications as prescribed. ?-Follow-up with your cardiologist and primary care provider, as discussed. ?-Return to the emergency department anytime if you begin to experience any new or worsening symptoms. ?

## 2021-08-08 NOTE — ED Triage Notes (Signed)
Pt to ED via POV from work. Pt reports she woke up this morning and her hands and feet were swollen. Pt states she started having centralized CP and SOB when she got to work. Pt denies blood thinner. Pt with hx MI.  ?

## 2021-08-11 ENCOUNTER — Encounter: Payer: Self-pay | Admitting: Internal Medicine

## 2021-08-14 ENCOUNTER — Encounter: Payer: Self-pay | Admitting: Internal Medicine

## 2021-08-14 ENCOUNTER — Ambulatory Visit (INDEPENDENT_AMBULATORY_CARE_PROVIDER_SITE_OTHER): Payer: BC Managed Care – PPO | Admitting: Internal Medicine

## 2021-08-14 VITALS — BP 108/72 | HR 94 | Temp 97.1°F | Wt 180.0 lb

## 2021-08-14 DIAGNOSIS — Z1231 Encounter for screening mammogram for malignant neoplasm of breast: Secondary | ICD-10-CM

## 2021-08-14 DIAGNOSIS — R0789 Other chest pain: Secondary | ICD-10-CM

## 2021-08-14 DIAGNOSIS — J189 Pneumonia, unspecified organism: Secondary | ICD-10-CM | POA: Diagnosis not present

## 2021-08-14 MED ORDER — TRAMADOL HCL 50 MG PO TABS: 50.0000 mg | ORAL_TABLET | Freq: Every day | ORAL | 0 refills | Status: DC | PRN

## 2021-08-14 NOTE — Patient Instructions (Signed)
Community-Acquired Pneumonia, Adult °Pneumonia is an infection of the lungs. It causes irritation and swelling in the airways of the lungs. Mucus and fluid may also build up inside the airways. This may cause coughing and trouble breathing. °One type of pneumonia can happen while you are in a hospital. A different type can happen when you are not in a hospital (community-acquired pneumonia). °What are the causes? °This condition is caused by germs (viruses, bacteria, or fungi). Some types of germs can spread from person to person. Pneumonia is not thought to spread from person to person. °What increases the risk? °You are more likely to develop this condition if: °You have a long-term (chronic) disease, such as: °Disease of the lungs. This may be chronic obstructive pulmonary disease (COPD) or asthma. °Heart failure. °Cystic fibrosis. °Diabetes. °Kidney disease. °Sickle cell disease. °HIV. °You have other health problems, such as: °Your body's defense system (immune system) is weak. °A condition that may cause you to breathe in fluids from your mouth and nose. °You had your spleen taken out. °You do not take good care of your teeth and mouth (poor dental hygiene). °You use or have used tobacco products. °You travel where the germs that cause this illness are common. °You are near certain animals or the places they live. °You are older than 58 years of age. °What are the signs or symptoms? °Symptoms of this condition include: °A cough. °A fever. °Sweating or chills. °Chest pain, often when you breathe deeply or cough. °Breathing problems, such as: °Fast breathing. °Trouble breathing. °Shortness of breath. °Feeling tired (fatigued). °Muscle aches. °How is this treated? °Treatment for this condition depends on many things, such as: °The cause of your illness. °Your medicines. °Your other health problems. °Most adults can be treated at home. Sometimes, treatment must happen in a hospital. °Treatment may include  medicines to kill germs. °Medicines may depend on which germ caused your illness. °Very bad pneumonia is rare. If you get it, you may: °Have a machine to help you breathe. °Have fluid taken away from around your lungs. °Follow these instructions at home: °Medicines °Take over-the-counter and prescription medicines only as told by your doctor. °Take cough medicine only if you are losing sleep. Cough medicine can keep your body from taking mucus away from your lungs. °If you were prescribed an antibiotic medicine, take it as told by your doctor. Do not stop taking the antibiotic even if you start to feel better. °Lifestyle °  °Do not drink alcohol. °Do not use any products that contain nicotine or tobacco, such as cigarettes, e-cigarettes, and chewing tobacco. If you need help quitting, ask your doctor. °Eat a healthy diet. This includes a lot of vegetables, fruits, whole grains, low-fat dairy products, and low-fat (lean) protein. °General instructions ° °Rest a lot. Sleep for at least 8 hours each night. °Sleep with your head and neck raised. Put a few pillows under your head or sleep in a reclining chair. °Return to your normal activities as told by your doctor. Ask your doctor what activities are safe for you. °Drink enough fluid to keep your pee (urine) pale yellow. °If your throat is sore, rinse your mouth often with salt water. To make salt water, dissolve ½-1 tsp (3-6 g) of salt in 1 cup (237 mL) of warm water. °Keep all follow-up visits as told by your doctor. This is important. °How is this prevented? °You can lower your risk of pneumonia by: °Getting the pneumonia shot (vaccine). These shots have different   types and schedules. Ask your doctor what works best for you. Think about getting this shot if: °You are older than 58 years of age. °You are 19-65 years of age and: °You are being treated for cancer. °You have long-term lung disease. °You have other problems that affect your body's defense system. Ask  your doctor if you have one of these. °Getting your flu shot every year. Ask your doctor which type of shot is best for you. °Going to the dentist as often as told. °Washing your hands often with soap and water for at least 20 seconds. If you cannot use soap and water, use hand sanitizer. °Contact a doctor if: °You have a fever. °You lose sleep because your cough medicine does not help. °Get help right away if: °You are short of breath and this gets worse. °You have more chest pain. °Your sickness gets worse. This is very serious if: °You are an older adult. °Your body's defense system is weak. °You cough up blood. °These symptoms may be an emergency. Do not wait to see if the symptoms will go away. Get medical help right away. Call your local emergency services (911 in the [place] Do not drive yourself to the hospital. °Summary °Pneumonia is an infection of the lungs. °Community-acquired pneumonia affects people who have not been in the hospital. Certain germs can cause this infection. °This condition may be treated with medicines that kill germs. °For very bad pneumonia, you may need a hospital stay and treatment to help with breathing. °This information is not intended to replace advice given to you by your health care provider. Make sure you discuss any questions you have with your health care provider. °Document Revised: 01/31/2019 Document Reviewed: 01/31/2019 °Elsevier Patient Education © 2022 [employer] ° °

## 2021-08-14 NOTE — Progress Notes (Signed)
? ?Subjective:  ? ? Patient ID: Brenda Grimes, female    DOB: Sep 21, 1965, 56 y.o.   MRN: [id number] ? ?HPI ? ?Patient presents to clinic today for ER follow-up.  She presented to the ER 4/7 with complaint of left upper chest pain that radiated into her left arm.  Labs showed a very slight anemia and marginally low WBC but otherwise unremarkable.  ECG was unremarkable.  Chest x-ray did show some consolidation in the left perihilar region.  CT angio chest was negative for PE.  She was discharged with amoxicillin for possible pneumonia and advised to follow-up with her PCP and cardiology.  She had just established with Dr. Lenn Cal 4 days prior to this ER visit.  Stress test had already been ordered on 4/17.  Since discharge, she reports she has taken her last dose of antibiotic today. She has some persistent left side chest pain which she describes as heaviness and tightness. The pain is worse with taking a deep breathing. She denies cough, shortness of breath, nausea, vomiting or reflux. She has a follow up with cardiology scheduled. ? ?Review of Systems ? ?   ?Past Medical History:  ?Diagnosis Date  ? Arthritis   ? CAD (coronary artery disease)   ? s/p stenting x 2  ? Cancer [facility])   ? anus cancer  ? Colon polyp   ? Depression   ? Hyperlipidemia   ? MI (myocardial infarction) ([place])   ? Thyroid disease   ? Vitamin D deficiency   ? ? ?Current Outpatient Medications  ?Medication Sig Dispense Refill  ? acetaminophen (TYLENOL) 500 MG tablet Take 1,000 mg by mouth every 6 (six) hours as needed.    ? buPROPion (WELLBUTRIN XL) 150 MG 24 hr tablet Take 1 tablet by mouth once daily 90 tablet 0  ? levothyroxine (SYNTHROID) 88 MCG tablet TAKE 1 TABLET BY MOUTH ONCE DAILY BEFORE BREAKFAST 90 tablet 0  ? loperamide (IMODIUM) 2 MG capsule Take 2 mg by mouth as needed for diarrhea or loose stools.    ? naproxen (NAPROSYN) 500 MG tablet Take 500 mg by mouth 3 (three) times daily as needed.    ? venlafaxine XR (EFFEXOR XR) 37.5 MG 24  hr capsule Take 1 capsule (37.5 mg total) by mouth daily with breakfast. (Patient taking differently: Take 37.5 mg by mouth daily as needed.) 30 capsule 2  ? vitamin B-12 (CYANOCOBALAMIN) 1000 MCG tablet Take 1,000 mcg by mouth daily.    ? ?No current facility-administered medications for this visit.  ? ? ?Allergies  ?Allergen Reactions  ? Sulfa Antibiotics Rash  ? Sulfasalazine Rash  ? Tape Rash  ? ? ?Family History  ?Problem Relation Age of Onset  ? Alzheimer's disease Mother   ? CAD Father   ? Alzheimer's disease Maternal Grandmother   ? Thyroid disease Brother   ? Hyperlipidemia Brother   ? Hyperlipidemia Brother   ? Alcohol abuse Brother   ? Healthy Daughter   ? Healthy Son   ? Heart disease Paternal Uncle   ? Heart disease Paternal Grandmother   ? Heart disease Paternal Grandfather   ? Thyroid disease Brother   ? Cirrhosis Brother   ? Healthy Son   ? Breast cancer Cousin   ? ? ?Social History  ? ?Socioeconomic History  ? Marital status: Married  ?  Spouse name: Not on file  ? Number of children: Not on file  ? Years of education: Not on file  ? Highest education level: Not  on file  ?Occupational History  ? Not on file  ?Tobacco Use  ? Smoking status: Former  ?  Packs/day: 0.75  ?  Years: 30.00  ?  Pack years: 22.50  ?  Types: Cigarettes  ?  Quit date: 01/21/2007  ?  Years since quitting: 14.5  ? Smokeless tobacco: Never  ?Vaping Use  ? Vaping Use: Never used  ?Substance and Sexual Activity  ? Alcohol use: No  ? Drug use: No  ? Sexual activity: Yes  ?  Birth control/protection: None  ?Other Topics Concern  ? Not on file  ?Social History Narrative  ? Not on file  ? ?Social Determinants of Health  ? ?Financial Resource Strain: Not on file  ?Food Insecurity: Not on file  ?Transportation Needs: Not on file  ?Physical Activity: Not on file  ?Stress: Not on file  ?Social Connections: Not on file  ?Intimate Partner Violence: Not on file  ? ? ? ?Constitutional: Denies fever, malaise, fatigue, headache or abrupt weight  changes.  ?HEENT: Denies eye pain, eye redness, ear pain, ringing in the ears, wax buildup, runny nose, nasal congestion, bloody nose, or sore throat. ?Respiratory: Denies difficulty breathing, shortness of breath, cough or sputum production.   ?Cardiovascular: Pt reports left side chest pain, intermittent swelling in her hands and feet. Denies chest tightness, palpitations.  ?Gastrointestinal: Pt has chronic abdominal pain. Denies bloating, constipation, diarrhea or blood in the stool.  ?Musculoskeletal: Denies decrease in range of motion, difficulty with gait, muscle pain or joint pain and swelling.  ?Skin: Denies redness, rashes, lesions or ulcercations.  ? ?No other specific complaints in a complete review of systems (except as listed in HPI above). ? ?Objective:  ? Physical Exam ?BP 108/72 (BP Location: Left Arm, Patient Position: Sitting, Cuff Size: Large)   Pulse 94   Temp (!) 97.1 ?F (36.2 ?C) (Temporal)   Wt 180 lb (81.6 kg)   SpO2 97%   BMI 32.66 kg/m?  ? ?Wt Readings from Last 3 Encounters:  ?08/08/21 181 lb 14.1 oz (82.5 kg)  ?07/24/21 181 lb 12.8 oz (82.5 kg)  ?07/24/21 181 lb (82.1 kg)  ? ? ?General: Appears her stated age, obese, in NAD. ?Skin: Warm, dry and intact. No rashes noted. ?Cardiovascular: Normal rate and rhythm. S1,S2 noted.  No murmur, rubs or gallops noted. No JVD or BLE edema.  ?Pulmonary/Chest: Normal effort and positive vesicular breath sounds. No respiratory distress. No wheezes, rales or ronchi noted.  ?Abdomen: Soft and nontender.  ?Musculoskeletal: Chest wall nontender with palpation. No difficulty with gait.  ?Neurological: Alert and oriented.  ? ?BMET ?   ?Component Value Date/Time  ? NA 140 08/08/2021 1509  ? K 3.8 08/08/2021 1509  ? CL 106 08/08/2021 1509  ? CO2 26 08/08/2021 1509  ? GLUCOSE 89 08/08/2021 1509  ? BUN 16 08/08/2021 1509  ? CREATININE 0.75 08/08/2021 1509  ? CREATININE 0.76 07/24/2021 1010  ? CALCIUM 8.8 (L) 08/08/2021 1509  ? GFRNONAA >60 08/08/2021 1509  ?  GFRNONAA 79 04/04/2020 0904  ? GFRAA 91 04/04/2020 0904  ? ? ?Lipid Panel  ?   ?Component Value Date/Time  ? CHOL 194 07/24/2021 1006  ? TRIG 103 07/24/2021 1006  ? HDL 66 07/24/2021 1006  ? CHOLHDL 2.9 07/24/2021 1006  ? LDLCALC 108 (H) 07/24/2021 1006  ? ? ?CBC ?   ?Component Value Date/Time  ? WBC 3.7 (L) 08/08/2021 1509  ? RBC 3.62 (L) 08/08/2021 1509  ? HGB 11.8 (L) 08/08/2021  1509  ? HCT 36.7 08/08/2021 1509  ? PLT 188 08/08/2021 1509  ? MCV 101.4 (H) 08/08/2021 1509  ? MCH 32.6 08/08/2021 1509  ? MCHC 32.2 08/08/2021 1509  ? RDW 12.9 08/08/2021 1509  ? LYMPHSABS 921 07/24/2021 1010  ? MONOABS 0.3 04/15/2020 1209  ? EOSABS 60 07/24/2021 1010  ? BASOSABS 39 07/24/2021 1010  ? ? ?Hgb A1C ?No results found for: HGBA1C ? ? ? ? ? ? ?   ?Assessment & Plan:  ? ?ER Follow-Up for Atypical Chest Pain, CAP: ? ?ER notes, labs and imaging reviewed ?Chest pain seems more consistent with possible pneumonia ?No indication to extend antibiotics ?We will plan to repeat chest x-ray in 4 weeks if symptoms have not improved ?She will follow-up with cardiology as planned for stress test ? ?Schedule an appointment for your annual exam ? ?Webb Silversmith, NP ? ?

## 2021-08-18 DIAGNOSIS — R079 Chest pain, unspecified: Secondary | ICD-10-CM | POA: Diagnosis not present

## 2021-08-20 ENCOUNTER — Other Ambulatory Visit: Payer: Self-pay | Admitting: Internal Medicine

## 2021-08-20 DIAGNOSIS — F339 Major depressive disorder, recurrent, unspecified: Secondary | ICD-10-CM

## 2021-08-20 NOTE — Telephone Encounter (Signed)
Requested Prescriptions  ?Pending Prescriptions Disp Refills  ?? buPROPion (WELLBUTRIN XL) 150 MG 24 hr tablet [Pharmacy Med Name: buPROPion HCl ER (XL) 150 MG Oral Tablet Extended Release 24 Hour] 90 tablet 0  ?  Sig: Take 1 tablet by mouth once daily  ?  ? Psychiatry: Antidepressants - bupropion Passed - 08/20/2021  8:38 AM  ?  ?  Passed - Cr in normal range and within 360 days  ?  Creat  ?Date Value Ref Range Status  ?07/24/2021 0.76 0.50 - 1.03 mg/dL Final  ? ?Creatinine, Ser  ?Date Value Ref Range Status  ?08/08/2021 0.75 0.44 - 1.00 mg/dL Final  ?   ?  ?  Passed - AST in normal range and within 360 days  ?  AST  ?Date Value Ref Range Status  ?07/24/2021 16 10 - 35 U/L Final  ?   ?  ?  Passed - ALT in normal range and within 360 days  ?  ALT  ?Date Value Ref Range Status  ?07/24/2021 10 6 - 29 U/L Final  ?   ?  ?  Passed - Completed PHQ-2 or PHQ-9 in the last 360 days  ?  ?  Passed - Last BP in normal range  ?  BP Readings from Last 1 Encounters:  ?08/14/21 108/72  ?   ?  ?  Passed - Valid encounter within last 6 months  ?  Recent Outpatient Visits   ?      ? 6 days ago Community acquired pneumonia of left lower lobe of lung  ? [facility] [place], [place] W, NP  ? 3 weeks ago Intermittent left-sided chest pain  ? [facility] [place], Dani Gobble, PA-C  ? 2 months ago Acute blood loss anemia  ? [place], DO  ? 6 months ago Bowel habit changes  ? [facility] [place], [place] W, NP  ? 11 months ago Hot flashes due to menopause  ? [facility] [place], Coralie Keens, NP  ?  ?  ?Future Appointments   ?        ? In 2 days Baity, Coralie Keens, NP [facility], [place]  ?  ? ?  ?  ?  ? ?

## 2021-08-21 DIAGNOSIS — R0602 Shortness of breath: Secondary | ICD-10-CM | POA: Diagnosis not present

## 2021-08-22 ENCOUNTER — Encounter: Payer: BC Managed Care – PPO | Admitting: Internal Medicine

## 2021-08-25 DIAGNOSIS — R0602 Shortness of breath: Secondary | ICD-10-CM | POA: Diagnosis not present

## 2021-08-25 DIAGNOSIS — R079 Chest pain, unspecified: Secondary | ICD-10-CM | POA: Diagnosis not present

## 2021-08-25 DIAGNOSIS — E7800 Pure hypercholesterolemia, unspecified: Secondary | ICD-10-CM | POA: Diagnosis not present

## 2021-08-25 DIAGNOSIS — Z955 Presence of coronary angioplasty implant and graft: Secondary | ICD-10-CM | POA: Diagnosis not present

## 2021-08-26 ENCOUNTER — Encounter: Payer: BC Managed Care – PPO | Admitting: Internal Medicine

## 2021-08-26 NOTE — Progress Notes (Deleted)
Subjective:    Patient ID: Brenda Grimes, female    DOB: [birth year], 56 y.o.   MRN: [id number]  HPI  Patient presents to clinic today for her annual exam.  Flu: 02/2021 Tetanus: COVID: Moderna x2 Shingrix: Never Pap smear: 02/2021 Mammogram: 02/2019 Bone density: Never Colon screening: 04/2019  Vision screening: Dentist:  Diet: Exercise:  Review of Systems     Past Medical History:  Diagnosis Date   Arthritis    CAD (coronary artery disease)    s/p stenting x 2   Cancer ([place])    anus cancer   Colon polyp    Depression    Hyperlipidemia    MI (myocardial infarction) ([place])    Thyroid disease    Vitamin D deficiency     Current Outpatient Medications  Medication Sig Dispense Refill   acetaminophen (TYLENOL) 500 MG tablet Take 1,000 mg by mouth every 6 (six) hours as needed.     buPROPion (WELLBUTRIN XL) 150 MG 24 hr tablet Take 1 tablet by mouth once daily 90 tablet 0   ibuprofen (ADVIL) 200 MG tablet Take 200 mg by mouth every 6 (six) hours as needed.     levothyroxine (SYNTHROID) 88 MCG tablet TAKE 1 TABLET BY MOUTH ONCE DAILY BEFORE BREAKFAST 90 tablet 0   loperamide (IMODIUM) 2 MG capsule Take 2 mg by mouth as needed for diarrhea or loose stools.     traMADol (ULTRAM) 50 MG tablet Take 1 tablet (50 mg total) by mouth daily as needed. 30 tablet 0   venlafaxine XR (EFFEXOR XR) 37.5 MG 24 hr capsule Take 1 capsule (37.5 mg total) by mouth daily with breakfast. (Patient taking differently: Take 37.5 mg by mouth daily as needed.) 30 capsule 2   vitamin B-12 (CYANOCOBALAMIN) 1000 MCG tablet Take 1,000 mcg by mouth daily.     No current facility-administered medications for this visit.    Allergies  Allergen Reactions   Sulfa Antibiotics Rash   Sulfasalazine Rash   Tape Rash    Family History  Problem Relation Age of Onset   Alzheimer's disease Mother    CAD Father    Alzheimer's disease Maternal Grandmother    Thyroid disease Brother    Hyperlipidemia  Brother    Hyperlipidemia Brother    Alcohol abuse Brother    Healthy Daughter    Healthy Son    Heart disease Paternal Uncle    Heart disease Paternal Grandmother    Heart disease Paternal Grandfather    Thyroid disease Brother    Cirrhosis Brother    Healthy Son    Breast cancer Cousin     Social History   Socioeconomic History   Marital status: Married    Spouse name: Not on file   Number of children: Not on file   Years of education: Not on file   Highest education level: Not on file  Occupational History   Not on file  Tobacco Use   Smoking status: Former    Packs/day: 0.75    Years: 30.00    Pack years: 22.50    Types: Cigarettes    Quit date: 01/21/2007    Years since quitting: 14.6   Smokeless tobacco: Never  Vaping Use   Vaping Use: Never used  Substance and Sexual Activity   Alcohol use: No   Drug use: No   Sexual activity: Yes    Birth control/protection: None  Other Topics Concern   Not on file  Social History Narrative  Not on file   Social Determinants of Health   Financial Resource Strain: Not on file  Food Insecurity: Not on file  Transportation Needs: Not on file  Physical Activity: Not on file  Stress: Not on file  Social Connections: Not on file  Intimate Partner Violence: Not on file     Constitutional: Denies fever, malaise, fatigue, headache or abrupt weight changes.  HEENT: Denies eye pain, eye redness, ear pain, ringing in the ears, wax buildup, runny nose, nasal congestion, bloody nose, or sore throat. Respiratory: Denies difficulty breathing, shortness of breath, cough or sputum production.   Cardiovascular: Denies chest pain, chest tightness, palpitations or swelling in the hands or feet.  Gastrointestinal: Patient reports chronic abdominal pain, diarrhea.  Denies abdominal pain, bloating, constipation, or blood in the stool.  GU: Denies urgency, frequency, pain with urination, burning sensation, blood in urine, odor or  discharge. Musculoskeletal: Pt reports low back pain. Denies decrease in range of motion, difficulty with gait, muscle pain or joint swelling.  Skin: Denies redness, rashes, lesions or ulcercations.  Neurological: Denies dizziness, difficulty with memory, difficulty with speech or problems with balance and coordination.  Psych: Pt has a history of depression. Denies anxiety, SI/HI.  No other specific complaints in a complete review of systems (except as listed in HPI above).  Objective:   Physical Exam   There were no vitals taken for this visit. Wt Readings from Last 3 Encounters:  08/14/21 180 lb (81.6 kg)  08/08/21 181 lb 14.1 oz (82.5 kg)  07/24/21 181 lb 12.8 oz (82.5 kg)    General: Appears their stated age, well developed, well nourished in NAD. Skin: Warm, dry and intact. No rashes, lesions or ulcerations noted. HEENT: Head: normal shape and size; Eyes: sclera white, no icterus, conjunctiva pink, PERRLA and EOMs intact; Ears: Tm's gray and intact, normal light reflex; Nose: mucosa pink and moist, septum midline; Throat/Mouth: Teeth present, mucosa pink and moist, no exudate, lesions or ulcerations noted.  Neck:  Neck supple, trachea midline. No masses, lumps or thyromegaly present.  Cardiovascular: Normal rate and rhythm. S1,S2 noted.  No murmur, rubs or gallops noted. No JVD or BLE edema. No carotid bruits noted. Pulmonary/Chest: Normal effort and positive vesicular breath sounds. No respiratory distress. No wheezes, rales or ronchi noted.  Abdomen: Soft and nontender. Normal bowel sounds. No distention or masses noted. Liver, spleen and kidneys non palpable. Musculoskeletal: Normal range of motion. No signs of joint swelling. No difficulty with gait.  Neurological: Alert and oriented. Cranial nerves II-XII grossly intact. Coordination normal.  Psychiatric: Mood and affect normal. Behavior is normal. Judgment and thought content normal.    BMET    Component Value Date/Time    NA 140 08/08/2021 1509   K 3.8 08/08/2021 1509   CL 106 08/08/2021 1509   CO2 26 08/08/2021 1509   GLUCOSE 89 08/08/2021 1509   BUN 16 08/08/2021 1509   CREATININE 0.75 08/08/2021 1509   CREATININE 0.76 07/24/2021 1010   CALCIUM 8.8 (L) 08/08/2021 1509   GFRNONAA >60 08/08/2021 1509   GFRNONAA 79 04/04/2020 0904   GFRAA 91 04/04/2020 0904    Lipid Panel     Component Value Date/Time   CHOL 194 07/24/2021 1006   TRIG 103 07/24/2021 1006   HDL 66 07/24/2021 1006   CHOLHDL 2.9 07/24/2021 1006   LDLCALC 108 (H) 07/24/2021 1006    CBC    Component Value Date/Time   WBC 3.7 (L) 08/08/2021 1509   RBC  3.62 (L) 08/08/2021 1509   HGB 11.8 (L) 08/08/2021 1509   HCT 36.7 08/08/2021 1509   PLT 188 08/08/2021 1509   MCV 101.4 (H) 08/08/2021 1509   MCH 32.6 08/08/2021 1509   MCHC 32.2 08/08/2021 1509   RDW 12.9 08/08/2021 1509   LYMPHSABS 921 07/24/2021 1010   MONOABS 0.3 04/15/2020 1209   EOSABS 60 07/24/2021 1010   BASOSABS 39 07/24/2021 1010    Hgb A1C No results found for: HGBA1C         Assessment & Plan:   Preventative Health Maintenance:  Encouraged her to get a flu shot in the fall Tetanus UTD Encouraged her to get her covid booster Discussed Shingrix vaccine, she will check coverage with her insurance provider and schedule a nurse visit to get this done Pap smear UTD Mammogram ordered- she will call to schedule Colon screening UTD Encouraged her to consume a balanced diet and exercise regimen Advised her to see an eye doctor and dentist annually Will check A1C, HIV and Hep C today  RTC in 6 months, follow up chronic conditions Webb Silversmith, NP

## 2021-09-04 ENCOUNTER — Encounter: Payer: BC Managed Care – PPO | Admitting: Internal Medicine

## 2021-09-04 NOTE — Progress Notes (Deleted)
Subjective:    Patient ID: Brenda Grimes, female    DOB: February 24, 1966, 56 y.o.   MRN: [id number]  HPI  Patient presents to clinic today for her annual exam.  Flu: 02/2021 Tetanus: COVID: Moderna x2 Shingrix: Pap smear: 02/2021 Mammogram: 02/2019 Colon screening: 04/2021 Vision screening: Dentist:  Diet: Exercise:  Review of Systems     Past Medical History:  Diagnosis Date   Arthritis    CAD (coronary artery disease)    s/p stenting x 2   Cancer ([place])    anus cancer   Colon polyp    Depression    Hyperlipidemia    MI (myocardial infarction) ([place])    Thyroid disease    Vitamin D deficiency     Current Outpatient Medications  Medication Sig Dispense Refill   acetaminophen (TYLENOL) 500 MG tablet Take 1,000 mg by mouth every 6 (six) hours as needed.     buPROPion (WELLBUTRIN XL) 150 MG 24 hr tablet Take 1 tablet by mouth once daily 90 tablet 0   ibuprofen (ADVIL) 200 MG tablet Take 200 mg by mouth every 6 (six) hours as needed.     levothyroxine (SYNTHROID) 88 MCG tablet TAKE 1 TABLET BY MOUTH ONCE DAILY BEFORE BREAKFAST 90 tablet 0   loperamide (IMODIUM) 2 MG capsule Take 2 mg by mouth as needed for diarrhea or loose stools.     traMADol (ULTRAM) 50 MG tablet Take 1 tablet (50 mg total) by mouth daily as needed. 30 tablet 0   venlafaxine XR (EFFEXOR XR) 37.5 MG 24 hr capsule Take 1 capsule (37.5 mg total) by mouth daily with breakfast. (Patient taking differently: Take 37.5 mg by mouth daily as needed.) 30 capsule 2   vitamin B-12 (CYANOCOBALAMIN) 1000 MCG tablet Take 1,000 mcg by mouth daily.     No current facility-administered medications for this visit.    Allergies  Allergen Reactions   Sulfa Antibiotics Rash   Sulfasalazine Rash   Tape Rash    Family History  Problem Relation Age of Onset   Alzheimer's disease Mother    CAD Father    Alzheimer's disease Maternal Grandmother    Thyroid disease Brother    Hyperlipidemia Brother    Hyperlipidemia  Brother    Alcohol abuse Brother    Healthy Daughter    Healthy Son    Heart disease Paternal Uncle    Heart disease Paternal Grandmother    Heart disease Paternal Grandfather    Thyroid disease Brother    Cirrhosis Brother    Healthy Son    Breast cancer Cousin     Social History   Socioeconomic History   Marital status: Married    Spouse name: Not on file   Number of children: Not on file   Years of education: Not on file   Highest education level: Not on file  Occupational History   Not on file  Tobacco Use   Smoking status: Former    Packs/day: 0.75    Years: 30.00    Pack years: 22.50    Types: Cigarettes    Quit date: 01/21/2007    Years since quitting: 14.6   Smokeless tobacco: Never  Vaping Use   Vaping Use: Never used  Substance and Sexual Activity   Alcohol use: No   Drug use: No   Sexual activity: Yes    Birth control/protection: None  Other Topics Concern   Not on file  Social History Narrative   Not on file  Social Determinants of Health   Financial Resource Strain: Not on file  Food Insecurity: Not on file  Transportation Needs: Not on file  Physical Activity: Not on file  Stress: Not on file  Social Connections: Not on file  Intimate Partner Violence: Not on file     Constitutional: Denies fever, malaise, fatigue, headache or abrupt weight changes.  HEENT: Denies eye pain, eye redness, ear pain, ringing in the ears, wax buildup, runny nose, nasal congestion, bloody nose, or sore throat. Respiratory: Denies difficulty breathing, shortness of breath, cough or sputum production.   Cardiovascular: Denies chest pain, chest tightness, palpitations or swelling in the hands or feet.  Gastrointestinal: Patient reports chronic abdominal pain, diarrhea.  Denies bloating, constipation, or blood in the stool.  GU: Denies urgency, frequency, pain with urination, burning sensation, blood in urine, odor or discharge. Musculoskeletal: Patient reports  chronic low back pain.  Denies decrease in range of motion, difficulty with gait, muscle pain or joint swelling.  Skin: Denies redness, rashes, lesions or ulcercations.  Neurological: Denies dizziness, difficulty with memory, difficulty with speech or problems with balance and coordination.  Psych: Patient has a history of depression.  Denies anxiety, SI/HI.  No other specific complaints in a complete review of systems (except as listed in HPI above).  Objective:   Physical Exam   There were no vitals taken for this visit. Wt Readings from Last 3 Encounters:  08/14/21 180 lb (81.6 kg)  08/08/21 181 lb 14.1 oz (82.5 kg)  07/24/21 181 lb 12.8 oz (82.5 kg)    General: Appears their stated age, well developed, well nourished in NAD. Skin: Warm, dry and intact. No rashes, lesions or ulcerations noted. HEENT: Head: normal shape and size; Eyes: sclera white, no icterus, conjunctiva pink, PERRLA and EOMs intact; Ears: Tm's gray and intact, normal light reflex; Nose: mucosa pink and moist, septum midline; Throat/Mouth: Teeth present, mucosa pink and moist, no exudate, lesions or ulcerations noted.  Neck:  Neck supple, trachea midline. No masses, lumps or thyromegaly present.  Cardiovascular: Normal rate and rhythm. S1,S2 noted.  No murmur, rubs or gallops noted. No JVD or BLE edema. No carotid bruits noted. Pulmonary/Chest: Normal effort and positive vesicular breath sounds. No respiratory distress. No wheezes, rales or ronchi noted.  Abdomen: Soft and nontender. Normal bowel sounds. No distention or masses noted. Liver, spleen and kidneys non palpable. Musculoskeletal: Normal range of motion. No signs of joint swelling. No difficulty with gait.  Neurological: Alert and oriented. Cranial nerves II-XII grossly intact. Coordination normal.  Psychiatric: Mood and affect normal. Behavior is normal. Judgment and thought content normal.    BMET    Component Value Date/Time   NA 140 08/08/2021 1509    K 3.8 08/08/2021 1509   CL 106 08/08/2021 1509   CO2 26 08/08/2021 1509   GLUCOSE 89 08/08/2021 1509   BUN 16 08/08/2021 1509   CREATININE 0.75 08/08/2021 1509   CREATININE 0.76 07/24/2021 1010   CALCIUM 8.8 (L) 08/08/2021 1509   GFRNONAA >60 08/08/2021 1509   GFRNONAA 79 04/04/2020 0904   GFRAA 91 04/04/2020 0904    Lipid Panel     Component Value Date/Time   CHOL 194 07/24/2021 1006   TRIG 103 07/24/2021 1006   HDL 66 07/24/2021 1006   CHOLHDL 2.9 07/24/2021 1006   LDLCALC 108 (H) 07/24/2021 1006    CBC    Component Value Date/Time   WBC 3.7 (L) 08/08/2021 1509   RBC 3.62 (L) 08/08/2021 1509  HGB 11.8 (L) 08/08/2021 1509   HCT 36.7 08/08/2021 1509   PLT 188 08/08/2021 1509   MCV 101.4 (H) 08/08/2021 1509   MCH 32.6 08/08/2021 1509   MCHC 32.2 08/08/2021 1509   RDW 12.9 08/08/2021 1509   LYMPHSABS 921 07/24/2021 1010   MONOABS 0.3 04/15/2020 1209   EOSABS 60 07/24/2021 1010   BASOSABS 39 07/24/2021 1010    Hgb A1C No results found for: HGBA1C         Assessment & Plan:   Preventative Health Maintenance:  Encouraged her to get a flu shot in the fall Tetanus Encouraged her to get her COVID booster Discussed Shingrix vaccine, she will check coverage with her insurance company and schedule a nurse visit if she would like to have this done Pap smear UTD Mammogram and bone density ordered-she will call to schedule Colon screening UTD Encouraged her to consume a balanced diet and exercise regimen Advised her to see an eye doctor and dentist annually We will check A1c, HIV and hep C today  RTC in 6 months, follow-up chronic conditions Webb Silversmith, NP

## 2021-09-09 ENCOUNTER — Encounter: Payer: Self-pay | Admitting: Internal Medicine

## 2021-09-09 ENCOUNTER — Ambulatory Visit (INDEPENDENT_AMBULATORY_CARE_PROVIDER_SITE_OTHER): Payer: BC Managed Care – PPO | Admitting: Internal Medicine

## 2021-09-09 VITALS — BP 134/76 | HR 78 | Temp 96.9°F | Ht 62.0 in | Wt 182.0 lb

## 2021-09-09 DIAGNOSIS — Z5181 Encounter for therapeutic drug level monitoring: Secondary | ICD-10-CM | POA: Diagnosis not present

## 2021-09-09 DIAGNOSIS — Z1231 Encounter for screening mammogram for malignant neoplasm of breast: Secondary | ICD-10-CM

## 2021-09-09 DIAGNOSIS — Z114 Encounter for screening for human immunodeficiency virus [HIV]: Secondary | ICD-10-CM | POA: Diagnosis not present

## 2021-09-09 DIAGNOSIS — Z1159 Encounter for screening for other viral diseases: Secondary | ICD-10-CM

## 2021-09-09 DIAGNOSIS — R300 Dysuria: Secondary | ICD-10-CM

## 2021-09-09 DIAGNOSIS — R3915 Urgency of urination: Secondary | ICD-10-CM

## 2021-09-09 DIAGNOSIS — Z780 Asymptomatic menopausal state: Secondary | ICD-10-CM

## 2021-09-09 DIAGNOSIS — R350 Frequency of micturition: Secondary | ICD-10-CM

## 2021-09-09 DIAGNOSIS — Z23 Encounter for immunization: Secondary | ICD-10-CM

## 2021-09-09 DIAGNOSIS — Z0001 Encounter for general adult medical examination with abnormal findings: Secondary | ICD-10-CM | POA: Diagnosis not present

## 2021-09-09 DIAGNOSIS — Z6833 Body mass index (BMI) 33.0-33.9, adult: Secondary | ICD-10-CM

## 2021-09-09 DIAGNOSIS — E6609 Other obesity due to excess calories: Secondary | ICD-10-CM

## 2021-09-09 LAB — POCT URINALYSIS DIPSTICK
Bilirubin, UA: NEGATIVE
Blood, UA: NEGATIVE
Glucose, UA: NEGATIVE
Ketones, UA: NEGATIVE
Leukocytes, UA: NEGATIVE
Nitrite, UA: NEGATIVE
Protein, UA: NEGATIVE
Spec Grav, UA: <=1.005 — AB (ref 1.010–1.025)
Urobilinogen, UA: 0.2 E.U./dL
pH, UA: 7 (ref 5.0–8.0)

## 2021-09-09 MED ORDER — OMEPRAZOLE MAGNESIUM 20 MG PO TBEC: 20.0000 mg | DELAYED_RELEASE_TABLET | Freq: Every day | ORAL | Status: AC

## 2021-09-09 NOTE — Patient Instructions (Signed)

## 2021-09-09 NOTE — Assessment & Plan Note (Signed)
Encouraged diet and exercise for weight loss ?

## 2021-09-09 NOTE — Progress Notes (Signed)
? ?Subjective:  ? ? Patient ID: Brenda Grimes, female    DOB: November 13, 1965, 56 y.o.   MRN: [id number] ? ?HPI ? ?Patient presents to clinic today for her annual exam. ? ?Flu: 02/2021 ?Tetanus: > 10 years ago ?COVID: Moderna x2 ?Shingrix: ?Pap smear: 02/2021 ?Mammogram: 02/2019 ?Colon screening: 04/2021 ?Vision screening: annually ?Dentist: as needed ? ?Diet: She does eat meat. She consumes more veggies than fruits. She does eat some fried foods. She drinks mostly Sprite Zero ?Exercise: Walking ? ?Review of Systems ? ?   ?Past Medical History:  ?Diagnosis Date  ? Arthritis   ? CAD (coronary artery disease)   ? s/p stenting x 2  ? Cancer [facility])   ? anus cancer  ? Colon polyp   ? Depression   ? Hyperlipidemia   ? MI (myocardial infarction) ([place])   ? Thyroid disease   ? Vitamin D deficiency   ? ? ?Current Outpatient Medications  ?Medication Sig Dispense Refill  ? acetaminophen (TYLENOL) 500 MG tablet Take 1,000 mg by mouth every 6 (six) hours as needed.    ? buPROPion (WELLBUTRIN XL) 150 MG 24 hr tablet Take 1 tablet by mouth once daily 90 tablet 0  ? ibuprofen (ADVIL) 200 MG tablet Take 200 mg by mouth every 6 (six) hours as needed.    ? levothyroxine (SYNTHROID) 88 MCG tablet TAKE 1 TABLET BY MOUTH ONCE DAILY BEFORE BREAKFAST 90 tablet 0  ? loperamide (IMODIUM) 2 MG capsule Take 2 mg by mouth as needed for diarrhea or loose stools.    ? traMADol (ULTRAM) 50 MG tablet Take 1 tablet (50 mg total) by mouth daily as needed. 30 tablet 0  ? venlafaxine XR (EFFEXOR XR) 37.5 MG 24 hr capsule Take 1 capsule (37.5 mg total) by mouth daily with breakfast. (Patient taking differently: Take 37.5 mg by mouth daily as needed.) 30 capsule 2  ? vitamin B-12 (CYANOCOBALAMIN) 1000 MCG tablet Take 1,000 mcg by mouth daily.    ? ?No current facility-administered medications for this visit.  ? ? ?Allergies  ?Allergen Reactions  ? Sulfa Antibiotics Rash  ? Sulfasalazine Rash  ? Tape Rash  ? ? ?Family History  ?Problem Relation Age of Onset  ?  Alzheimer's disease Mother   ? CAD Father   ? Alzheimer's disease Maternal Grandmother   ? Thyroid disease Brother   ? Hyperlipidemia Brother   ? Hyperlipidemia Brother   ? Alcohol abuse Brother   ? Healthy Daughter   ? Healthy Son   ? Heart disease Paternal Uncle   ? Heart disease Paternal Grandmother   ? Heart disease Paternal Grandfather   ? Thyroid disease Brother   ? Cirrhosis Brother   ? Healthy Son   ? Breast cancer Cousin   ? ? ?Social History  ? ?Socioeconomic History  ? Marital status: Married  ?  Spouse name: Not on file  ? Number of children: Not on file  ? Years of education: Not on file  ? Highest education level: Not on file  ?Occupational History  ? Not on file  ?Tobacco Use  ? Smoking status: Former  ?  Packs/day: 0.75  ?  Years: 30.00  ?  Pack years: 22.50  ?  Types: Cigarettes  ?  Quit date: 01/21/2007  ?  Years since quitting: 14.6  ? Smokeless tobacco: Never  ?Vaping Use  ? Vaping Use: Never used  ?Substance and Sexual Activity  ? Alcohol use: No  ? Drug use: No  ?  Sexual activity: Yes  ?  Birth control/protection: None  ?Other Topics Concern  ? Not on file  ?Social History Narrative  ? Not on file  ? ?Social Determinants of Health  ? ?Financial Resource Strain: Not on file  ?Food Insecurity: Not on file  ?Transportation Needs: Not on file  ?Physical Activity: Not on file  ?Stress: Not on file  ?Social Connections: Not on file  ?Intimate Partner Violence: Not on file  ? ? ? ?Constitutional: Denies fever, malaise, fatigue, headache or abrupt weight changes.  ?HEENT: Denies eye pain, eye redness, ear pain, ringing in the ears, wax buildup, runny nose, nasal congestion, bloody nose, or sore throat. ?Respiratory: Denies difficulty breathing, shortness of breath, cough or sputum production.   ?Cardiovascular: Denies chest pain, chest tightness, palpitations or swelling in the hands or feet.  ?Gastrointestinal: Patient reports chronic abdominal pain with intermittent diarrhea.  Denies abdominal pain,  bloating, constipation, or blood in the stool.  ?GU: Pt reports urgency, frequency, dysuria. Denies blood in urine, odor or discharge. ?Musculoskeletal: Patient reports back pain.  Denies decrease in range of motion, difficulty with gait, muscle pain or joint swelling.  ?Skin: Denies redness, rashes, lesions or ulcercations.  ?Neurological: Denies dizziness, difficulty with memory, difficulty with speech or problems with balance and coordination.  ?Psych: Patient has a history of depression.  Denies anxiety, SI/HI. ? ?No other specific complaints in a complete review of systems (except as listed in HPI above). ? ?Objective:  ? Physical Exam ?BP 134/76 (BP Location: Left Arm, Patient Position: Sitting, Cuff Size: Large)   Pulse 78   Temp (!) 96.9 ?F (36.1 ?C) (Temporal)   Ht '5\' 2"'$  (1.575 m)   Wt 182 lb (82.6 kg)   SpO2 99%   BMI 33.29 kg/m?  ? ?Wt Readings from Last 3 Encounters:  ?08/14/21 180 lb (81.6 kg)  ?08/08/21 181 lb 14.1 oz (82.5 kg)  ?07/24/21 181 lb 12.8 oz (82.5 kg)  ? ? ?General: Appears her stated age, obese, in NAD. ?Skin: Warm, dry and intact.  ?HEENT: Head: normal shape and size; Eyes: sclera white, no icterus, conjunctiva pink, PERRLA and EOMs intact;  ?Neck:  Neck supple, trachea midline. No masses, lumps or thyromegaly present.  ?Cardiovascular: Normal rate and rhythm. S1,S2 noted.  No murmur, rubs or gallops noted. No JVD or BLE edema. No carotid bruits noted. ?Pulmonary/Chest: Normal effort and positive vesicular breath sounds. No respiratory distress. No wheezes, rales or ronchi noted.  ?Abdomen: Soft and tender to palpation over the bladder. Normal bowel sounds.  ?Musculoskeletal: Strength 5/5 BUE/BLE. No difficulty with gait.  ?Neurological: Alert and oriented. Cranial nerves II-XII grossly intact. Coordination normal.  ?Psychiatric: Mood and affect normal. Behavior is normal. Judgment and thought content normal.  ? ? ? ?BMET ?   ?Component Value Date/Time  ? NA 140 08/08/2021 1509  ? K  3.8 08/08/2021 1509  ? CL 106 08/08/2021 1509  ? CO2 26 08/08/2021 1509  ? GLUCOSE 89 08/08/2021 1509  ? BUN 16 08/08/2021 1509  ? CREATININE 0.75 08/08/2021 1509  ? CREATININE 0.76 07/24/2021 1010  ? CALCIUM 8.8 (L) 08/08/2021 1509  ? GFRNONAA >60 08/08/2021 1509  ? GFRNONAA 79 04/04/2020 0904  ? GFRAA 91 04/04/2020 0904  ? ? ?Lipid Panel  ?   ?Component Value Date/Time  ? CHOL 194 07/24/2021 1006  ? TRIG 103 07/24/2021 1006  ? HDL 66 07/24/2021 1006  ? CHOLHDL 2.9 07/24/2021 1006  ? LDLCALC 108 (H) 07/24/2021 1006  ? ? ?  CBC ?   ?Component Value Date/Time  ? WBC 3.7 (L) 08/08/2021 1509  ? RBC 3.62 (L) 08/08/2021 1509  ? HGB 11.8 (L) 08/08/2021 1509  ? HCT 36.7 08/08/2021 1509  ? PLT 188 08/08/2021 1509  ? MCV 101.4 (H) 08/08/2021 1509  ? MCH 32.6 08/08/2021 1509  ? MCHC 32.2 08/08/2021 1509  ? RDW 12.9 08/08/2021 1509  ? LYMPHSABS 921 07/24/2021 1010  ? MONOABS 0.3 04/15/2020 1209  ? EOSABS 60 07/24/2021 1010  ? BASOSABS 39 07/24/2021 1010  ? ? ?Hgb A1C ?No results found for: HGBA1C ? ? ? ? ? ?   ?Assessment & Plan:  ? ?Preventative Health Maintenance: ? ?Encouraged her to get a flu shot in the fall ?Tetanus today ?Encouraged her to get a COVID booster ?Discussed Shingrix vaccine, she will check coverage with her insurance company and schedule a nurse visit if she would like to have this done ?Pap smear UTD ?Mammogram and bone density ordered- she will call to schedule ?Colon screening UTD ?Encouraged her to consume a balanced diet and exercise regimen ?Advised her to see an eye doctor and dentist annually ?We will check TSH, A1c, HIV and hep C today ? ?Urinary Urgency, Frequency, Dysuria: ? ?Urinalysis: normal ?Push fluids ? ?RTC in 6 months, follow-up chronic conditions ?Webb Silversmith, NP ? ? ?

## 2021-09-09 NOTE — Addendum Note (Signed)
Addended by: Ashley Royalty E on: 09/09/2021 11:47 AM ? ? Modules accepted: Orders ? ?

## 2021-09-10 LAB — HEPATITIS C ANTIBODY
Hepatitis C Ab: NONREACTIVE
SIGNAL TO CUT-OFF: 0.08 (ref <1.00)

## 2021-09-10 LAB — DRUG MONITORING, PANEL 8 WITH CONFIRMATION, URINE
6 Acetylmorphine: NEGATIVE ng/mL (ref <10)
Alcohol Metabolites: NEGATIVE ng/mL (ref <500)
Amphetamines: NEGATIVE ng/mL (ref <500)
Benzodiazepines: NEGATIVE ng/mL (ref <100)
Buprenorphine, Urine: NEGATIVE ng/mL (ref <5)
Cocaine Metabolite: NEGATIVE ng/mL (ref <150)
Creatinine: 57.7 mg/dL (ref >=20.0)
MDMA: NEGATIVE ng/mL (ref <500)
Marijuana Metabolite: NEGATIVE ng/mL (ref <20)
Opiates: NEGATIVE ng/mL (ref <100)
Oxidant: NEGATIVE ug/mL (ref <200)
Oxycodone: NEGATIVE ng/mL (ref <100)
pH: 7.2 (ref 4.5–9.0)

## 2021-09-10 LAB — TSH: TSH: 0.65 mIU/L (ref 0.40–4.50)

## 2021-09-10 LAB — HEMOGLOBIN A1C
Hgb A1c MFr Bld: 5.2 % of total Hgb (ref <5.7)
Mean Plasma Glucose: 103 mg/dL
eAG (mmol/L): 5.7 mmol/L

## 2021-09-10 LAB — DM TEMPLATE

## 2021-09-10 LAB — HIV ANTIBODY (ROUTINE TESTING W REFLEX): HIV 1&2 Ab, 4th Generation: NONREACTIVE

## 2021-10-17 ENCOUNTER — Other Ambulatory Visit: Payer: Self-pay | Admitting: Internal Medicine

## 2021-10-17 NOTE — Telephone Encounter (Signed)
Requested medication (s) are due for refill today: Yes  Requested medication (s) are on the active medication list: Yes  Last refill:  08/14/21  Future visit scheduled: No  Notes to clinic:  Not delegated.    Requested Prescriptions  Pending Prescriptions Disp Refills   traMADol (ULTRAM) 50 MG tablet [Pharmacy Med Name: traMADol HCl 50 MG Oral Tablet] 30 tablet 0    Sig: TAKE 1 TABLET BY MOUTH ONCE DAILY AS NEEDED     Not Delegated - Analgesics:  Opioid Agonists Failed - 10/17/2021  9:55 AM      Failed - This refill cannot be delegated      Failed - Urine Drug Screen completed in last 360 days      Passed - Valid encounter within last 3 months    Recent Outpatient Visits           1 month ago Encounter for general adult medical examination with abnormal findings   [facility] [place], Coralie Keens, NP   2 months ago Community acquired pneumonia of left lower lobe of lung   [facility], [place] W, NP   2 months ago Intermittent left-sided chest pain   [place], [place]   4 months ago Acute blood loss anemia   [place], DO   8 months ago Bowel habit changes   [facility] [place], Coralie Keens, [place]

## 2021-10-29 DIAGNOSIS — R197 Diarrhea, unspecified: Secondary | ICD-10-CM | POA: Diagnosis not present

## 2021-10-29 DIAGNOSIS — R21 Rash and other nonspecific skin eruption: Secondary | ICD-10-CM | POA: Diagnosis not present

## 2021-10-29 DIAGNOSIS — N895 Stricture and atresia of vagina: Secondary | ICD-10-CM | POA: Diagnosis not present

## 2021-10-29 DIAGNOSIS — R159 Full incontinence of feces: Secondary | ICD-10-CM | POA: Diagnosis not present

## 2021-11-11 ENCOUNTER — Other Ambulatory Visit: Payer: Self-pay | Admitting: Internal Medicine

## 2021-11-11 DIAGNOSIS — F339 Major depressive disorder, recurrent, unspecified: Secondary | ICD-10-CM

## 2021-11-12 NOTE — Telephone Encounter (Signed)
Requested Prescriptions  Pending Prescriptions Disp Refills  . buPROPion (WELLBUTRIN XL) 150 MG 24 hr tablet [Pharmacy Med Name: buPROPion HCl ER (XL) 150 MG Oral Tablet Extended Release 24 Hour] 90 tablet 1    Sig: Take 1 tablet by mouth once daily     Psychiatry: Antidepressants - bupropion Passed - 11/11/2021 11:15 AM      Passed - Cr in normal range and within 360 days    Creat  Date Value Ref Range Status  07/24/2021 0.76 0.50 - 1.03 mg/dL Final   Creatinine, Ser  Date Value Ref Range Status  08/08/2021 0.75 0.44 - 1.00 mg/dL Final         Passed - AST in normal range and within 360 days    AST  Date Value Ref Range Status  07/24/2021 16 10 - 35 U/L Final         Passed - ALT in normal range and within 360 days    ALT  Date Value Ref Range Status  07/24/2021 10 6 - 29 U/L Final         Passed - Completed PHQ-2 or PHQ-9 in the last 360 days      Passed - Last BP in normal range    BP Readings from Last 1 Encounters:  09/09/21 134/76         Passed - Valid encounter within last 6 months    Recent Outpatient Visits          2 months ago Encounter for general adult medical examination with abnormal findings   [facility] [place], Coralie Keens, NP   3 months ago Community acquired pneumonia of left lower lobe of lung   [place], [place], NP   3 months ago Intermittent left-sided chest pain   [facility] [place], [place], [place]   5 months ago Acute blood loss anemia   [facility] [place], DO   9 months ago Bowel habit changes   [facility] [place], Coralie Keens, [place]

## 2021-11-28 ENCOUNTER — Ambulatory Visit: Payer: BC Managed Care – PPO

## 2021-12-02 ENCOUNTER — Other Ambulatory Visit: Payer: Self-pay | Admitting: Internal Medicine

## 2021-12-02 DIAGNOSIS — E039 Hypothyroidism, unspecified: Secondary | ICD-10-CM

## 2021-12-03 NOTE — Telephone Encounter (Signed)
Requested Prescriptions  Pending Prescriptions Disp Refills  . levothyroxine (SYNTHROID) 88 MCG tablet [Pharmacy Med Name: Levothyroxine Sodium 88 MCG Oral Tablet] 90 tablet 0    Sig: TAKE 1 TABLET BY MOUTH ONCE DAILY BEFORE BREAKFAST     Endocrinology:  Hypothyroid Agents Passed - 12/02/2021  9:31 AM      Passed - TSH in normal range and within 360 days    TSH  Date Value Ref Range Status  09/09/2021 0.65 0.40 - 4.50 mIU/L Final         Passed - Valid encounter within last 12 months    Recent Outpatient Visits          2 months ago Encounter for general adult medical examination with abnormal findings   [facility] [place], Coralie Keens, NP   3 months ago Community acquired pneumonia of left lower lobe of lung   [facility] [place], [place], NP   4 months ago Intermittent left-sided chest pain   [place], [place]   6 months ago Acute blood loss anemia   [place], DO   10 months ago Bowel habit changes   [facility] [place], Coralie Keens, NP      Future Appointments            In 2 days [facility] [place]

## 2021-12-05 ENCOUNTER — Ambulatory Visit (INDEPENDENT_AMBULATORY_CARE_PROVIDER_SITE_OTHER): Payer: BC Managed Care – PPO

## 2021-12-05 VITALS — Wt 182.0 lb

## 2021-12-05 DIAGNOSIS — Z1231 Encounter for screening mammogram for malignant neoplasm of breast: Secondary | ICD-10-CM | POA: Diagnosis not present

## 2021-12-05 DIAGNOSIS — Z0000 Encounter for general adult medical examination without abnormal findings: Secondary | ICD-10-CM | POA: Diagnosis not present

## 2021-12-05 NOTE — Patient Instructions (Signed)
Brenda Grimes , Thank you for taking time to come for your Medicare Wellness Visit. I appreciate your ongoing commitment to your health goals. Please review the following plan we discussed and let me know if I can assist you in the future.   Screening recommendations/referrals: Colonoscopy: 04/17/21 Mammogram: 02/15/19 Bone Density: too young Recommended yearly ophthalmology/optometry visit for glaucoma screening and checkup Recommended yearly dental visit for hygiene and checkup  Vaccinations: Influenza vaccine: 02/04/21 Pneumococcal vaccine: n/d Tdap vaccine: 09/09/21 Shingles vaccine: n/d  Covid-19: 10/10/19, 11/09/19  Advanced directives: no  Conditions/risks identified: none  Next appointment: Follow up in one year for your annual wellness visit. 12/11/22 @ 10:15 am by phone  Preventive Care 40-64 Years, Female Preventive care refers to lifestyle choices and visits with your health care provider that can promote health and wellness. What does preventive care include? A yearly physical exam. This is also called an annual well check. Dental exams once or twice a year. Routine eye exams. Ask your health care provider how often you should have your eyes checked. Personal lifestyle choices, including: Daily care of your teeth and gums. Regular physical activity. Eating a healthy diet. Avoiding tobacco and drug use. Limiting alcohol use. Practicing safe sex. Taking low-dose aspirin daily starting at age 58. Taking vitamin and mineral supplements as recommended by your health care provider. What happens during an annual well check? The services and screenings done by your health care provider during your annual well check will depend on your age, overall health, lifestyle risk factors, and family history of disease. Counseling  Your health care provider may ask you questions about your: Alcohol use. Tobacco use. Drug use. Emotional well-being. Home and relationship well-being. Sexual  activity. Eating habits. Work and work Statistician. Method of birth control. Menstrual cycle. Pregnancy history. Screening  You may have the following tests or measurements: Height, weight, and BMI. Blood pressure. Lipid and cholesterol levels. These may be checked every 5 years, or more frequently if you are over 84 years old. Skin check. Lung cancer screening. You may have this screening every year starting at age 58 if you have a 30-pack-year history of smoking and currently smoke or have quit within the past 15 years. Fecal occult blood test (FOBT) of the stool. You may have this test every year starting at age 58. Flexible sigmoidoscopy or colonoscopy. You may have a sigmoidoscopy every 5 years or a colonoscopy every 10 years starting at age 21. Hepatitis C blood test. Hepatitis B blood test. Sexually transmitted disease (STD) testing. Diabetes screening. This is done by checking your blood sugar (glucose) after you have not eaten for a while (fasting). You may have this done every 1-3 years. Mammogram. This may be done every 1-2 years. Talk to your health care provider about when you should start having regular mammograms. This may depend on whether you have a family history of breast cancer. BRCA-related cancer screening. This may be done if you have a family history of breast, ovarian, tubal, or peritoneal cancers. Pelvic exam and Pap test. This may be done every 3 years starting at age 58. Starting at age 1, this may be done every 5 years if you have a Pap test in combination with an HPV test. Bone density scan. This is done to screen for osteoporosis. You may have this scan if you are at high risk for osteoporosis. Discuss your test results, treatment options, and if necessary, the need for more tests with your health care provider. Vaccines  Your health care provider may recommend certain vaccines, such as: Influenza vaccine. This is recommended every year. Tetanus, diphtheria,  and acellular pertussis (Tdap, Td) vaccine. You may need a Td booster every 10 years. Zoster vaccine. You may need this after age 60. Pneumococcal 13-valent conjugate (PCV13) vaccine. You may need this if you have certain conditions and were not previously vaccinated. Pneumococcal polysaccharide (PPSV23) vaccine. You may need one or two doses if you smoke cigarettes or if you have certain conditions. Talk to your health care provider about which screenings and vaccines you need and how often you need them. This information is not intended to replace advice given to you by your health care provider. Make sure you discuss any questions you have with your health care provider. Document Released: 05/17/2015 Document Revised: 01/08/2016 Document Reviewed: 02/19/2015 Elsevier Interactive Patient Education  2017 [employer] Inc[employer]    Fall Prevention in the Home Falls can cause injuries. They can happen to people of all ages. There are many things you can do to make your home safe and to help prevent falls. What can I do on the outside of my home? Regularly fix the edges of walkways and driveways and fix any cracks. Remove anything that might make you trip as you walk through a door, such as a raised step or threshold. Trim any bushes or trees on the path to your home. Use bright outdoor lighting. Clear any walking paths of anything that might make someone trip, such as rocks or tools. Regularly check to see if handrails are loose or broken. Make sure that both sides of any steps have handrails. Any raised decks and porches should have guardrails on the edges. Have any leaves, snow, or ice cleared regularly. Use sand or salt on walking paths during winter. Clean up any spills in your garage right away. This includes oil or grease spills. What can I do in the bathroom? Use night lights. Install grab bars by the toilet and in the tub and shower. Do not use towel bars as grab bars. Use non-skid mats or  decals in the tub or shower. If you need to sit down in the shower, use a plastic, non-slip stool. Keep the floor dry. Clean up any water that spills on the floor as soon as it happens. Remove soap buildup in the tub or shower regularly. Attach bath mats securely with double-sided non-slip rug tape. Do not have throw rugs and other things on the floor that can make you trip. What can I do in the bedroom? Use night lights. Make sure that you have a light by your bed that is easy to reach. Do not use any sheets or blankets that are too big for your bed. They should not hang down onto the floor. Have a firm chair that has side arms. You can use this for support while you get dressed. Do not have throw rugs and other things on the floor that can make you trip. What can I do in the kitchen? Clean up any spills right away. Avoid walking on wet floors. Keep items that you use a lot in easy-to-reach places. If you need to reach something above you, use a strong step stool that has a grab bar. Keep electrical cords out of the way. Do not use floor polish or wax that makes floors slippery. If you must use wax, use non-skid floor wax. Do not have throw rugs and other things on the floor that can make you trip. What   can I do with my stairs? Do not leave any items on the stairs. Make sure that there are handrails on both sides of the stairs and use them. Fix handrails that are broken or loose. Make sure that handrails are as long as the stairways. Check any carpeting to make sure that it is firmly attached to the stairs. Fix any carpet that is loose or worn. Avoid having throw rugs at the top or bottom of the stairs. If you do have throw rugs, attach them to the floor with carpet tape. Make sure that you have a light switch at the top of the stairs and the bottom of the stairs. If you do not have them, ask someone to add them for you. What else can I do to help prevent falls? Wear shoes that: Do not  have high heels. Have rubber bottoms. Are comfortable and fit you well. Are closed at the toe. Do not wear sandals. If you use a stepladder: Make sure that it is fully opened. Do not climb a closed stepladder. Make sure that both sides of the stepladder are locked into place. Ask someone to hold it for you, if possible. Clearly mark and make sure that you can see: Any grab bars or handrails. First and last steps. Where the edge of each step is. Use tools that help you move around (mobility aids) if they are needed. These include: Canes. Walkers. Scooters. Crutches. Turn on the lights when you go into a dark area. Replace any light bulbs as soon as they burn out. Set up your furniture so you have a clear path. Avoid moving your furniture around. If any of your floors are uneven, fix them. If there are any pets around you, be aware of where they are. Review your medicines with your doctor. Some medicines can make you feel dizzy. This can increase your chance of falling. Ask your doctor what other things that you can do to help prevent falls. This information is not intended to replace advice given to you by your health care provider. Make sure you discuss any questions you have with your health care provider. Document Released: 02/14/2009 Document Revised: 09/26/2015 Document Reviewed: 05/25/2014 Elsevier Interactive Patient Education  2017 [employer] Inc.  

## 2021-12-05 NOTE — Progress Notes (Signed)
Virtual Visit via Telephone Note  I connected with  Shella Spearing on 12/05/21 at 11:00 AM EDT by telephone and verified that I am speaking with the correct person using two identifiers.  Location: Patient: home Provider: [facility] Persons participating in the virtual visit: [place]   I discussed the limitations, risks, security and privacy concerns of performing an evaluation and management service by telephone and the availability of in person appointments. The patient expressed understanding and agreed to proceed.  Interactive audio and video telecommunications were attempted between this nurse and patient, however failed, due to patient having technical difficulties OR patient did not have access to video capability.  We continued and completed visit with audio only.  Some vital signs may be absent or patient reported.   Dionisio David, LPN  Subjective:   Irini Leet is a 56 y.o. female who presents for Medicare Annual (Subsequent) preventive examination.  Review of Systems     Cardiac Risk Factors include: dyslipidemia     Objective:    There were no vitals filed for this visit. There is no height or weight on file to calculate BMI.     12/05/2021   11:04 AM 08/08/2021    3:09 PM 06/02/2021    9:11 PM 07/16/2020   10:04 AM 05/02/2020    2:36 PM 04/15/2020   10:53 AM 07/11/2019    9:34 AM  Advanced Directives  Does Patient Have a Medical Advance Directive? No No No No No No No  Would patient like information on creating a medical advance directive? No - Patient declined No - Patient declined No - Patient declined  No - Patient declined No - Patient declined     Current Medications (verified) Outpatient Encounter Medications as of 12/05/2021  Medication Sig   acetaminophen (TYLENOL) 500 MG tablet Take 1,000 mg by mouth every 6 (six) hours as needed.   buPROPion (WELLBUTRIN XL) 150 MG 24 hr tablet Take 1 tablet by mouth once daily   cyclobenzaprine  (FLEXERIL) 10 MG tablet Take 1 tablet by oral route at bedtime.   estradiol (ESTRACE) 0.1 MG/GM vaginal cream Place vaginally.   gabapentin (NEURONTIN) 300 MG capsule TAKE 2 CAPSULES BY MOUTH ONCE DAILY AND 3 NIGHTLY   ibuprofen (ADVIL) 200 MG tablet Take 200 mg by mouth every 6 (six) hours as needed.   ketoconazole (NIZORAL) 2 % cream Apply topically 2 (two) times daily.   levothyroxine (SYNTHROID) 75 MCG tablet Take 1 tablet by mouth daily before breakfast.   levothyroxine (SYNTHROID) 88 MCG tablet Take 1 tablet by mouth daily before breakfast.   loperamide (IMODIUM) 2 MG capsule Take 2 mg by mouth as needed for diarrhea or loose stools.   nitroGLYCERIN (NITROSTAT) 0.4 MG SL tablet Place under the tongue.   omeprazole (PRILOSEC OTC) 20 MG tablet Take 1 tablet (20 mg total) by mouth daily.   traMADol (ULTRAM) 50 MG tablet TAKE 1 TABLET BY MOUTH ONCE DAILY AS NEEDED   triamcinolone cream (KENALOG) 0.1 % Apply topically 2 (two) times daily.   venlafaxine XR (EFFEXOR XR) 37.5 MG 24 hr capsule Take 1 capsule (37.5 mg total) by mouth daily with breakfast. (Patient taking differently: Take 37.5 mg by mouth daily as needed.)   azithromycin (ZITHROMAX) 250 MG tablet TAKE 2 TABLETS BY MOUTH ON DAY 1 AND THEN TAKE 1 TABLET BY MOUTH ONCE A DAY ON DAY 2 THROUGH DAY 5 (Patient not taking: Reported on 12/05/2021)   Cholecalciferol (VITAMIN D-1000 MAX ST) 25  MCG (1000 UT) tablet Take by mouth. (Patient not taking: Reported on 12/05/2021)   traZODone (DESYREL) 50 MG tablet TAKE 1 TABLET BY MOUTH AT BEDTIME START FOR FIRST 7 8 DAYS WITH 1 2 TABLET AT BEDTIME (Patient not taking: Reported on 12/05/2021)   vitamin B-12 (CYANOCOBALAMIN) 1000 MCG tablet Take 1,000 mcg by mouth daily. (Patient not taking: Reported on 12/05/2021)   [DISCONTINUED] buPROPion (WELLBUTRIN XL) 150 MG 24 hr tablet Take 1 tablet by mouth daily.   [DISCONTINUED] levothyroxine (SYNTHROID) 88 MCG tablet TAKE 1 TABLET BY MOUTH ONCE DAILY BEFORE BREAKFAST    No facility-administered encounter medications on file as of 12/05/2021.    Allergies (verified) Sulfa antibiotics, Sulfasalazine, and Tape   History: Past Medical History:  Diagnosis Date   Arthritis    CAD (coronary artery disease)    s/p stenting x 2   Cancer (HCC)    anus cancer   Colon polyp    Depression    Hyperlipidemia    MI (myocardial infarction) ([place])    Thyroid disease    Vitamin D deficiency    Past Surgical History:  Procedure Laterality Date   CARDIAC SURGERY     COLONOSCOPY WITH PROPOFOL N/A 04/17/2021   Procedure: COLONOSCOPY WITH BIOPSY;  Surgeon: Lin Landsman, MD;  Location: [place];  Service: Endoscopy;  Laterality: N/A;   CORONARY ANGIOPLASTY WITH STENT PLACEMENT     POLYPECTOMY N/A 04/17/2021   Procedure: POLYPECTOMY;  Surgeon: Lin Landsman, MD;  Location: [place];  Service: Endoscopy;  Laterality: N/A;   TUBAL LIGATION     Family History  Problem Relation Age of Onset   Alzheimer's disease Mother    CAD Father    Thyroid disease Brother    Hyperlipidemia Brother    Hyperlipidemia Brother    Alcohol abuse Brother    Thyroid disease Brother    Cirrhosis Brother    Alzheimer's disease Maternal Grandmother    Heart disease Paternal Grandmother    Heart disease Paternal Grandfather    Healthy Daughter    Healthy Son    Healthy Son    Heart disease Paternal Uncle    Breast cancer Cousin    Cancer Paternal Aunt        Possible colon cancer   Social History   Socioeconomic History   Marital status: Married    Spouse name: Not on file   Number of children: Not on file   Years of education: Not on file   Highest education level: Not on file  Occupational History   Not on file  Tobacco Use   Smoking status: Former    Packs/day: 0.75    Years: 30.00    Total pack years: 22.50    Types: Cigarettes    Quit date: 01/21/2007    Years since quitting: 14.8   Smokeless tobacco: Never  Vaping Use    Vaping Use: Never used  Substance and Sexual Activity   Alcohol use: No   Drug use: No   Sexual activity: Yes    Birth control/protection: None  Other Topics Concern   Not on file  Social History Narrative   Not on file   Social Determinants of Health   Financial Resource Strain: Low Risk  (12/05/2021)   Overall Financial Resource Strain (CARDIA)    Difficulty of Paying Living Expenses: Not hard at all  Food Insecurity: No Food Insecurity (12/05/2021)   Hunger Vital Sign    Worried About Running Out of  Food in the Last Year: Never true    [place] in the Last Year: Never true  Transportation Needs: No Transportation Needs (12/05/2021)   PRAPARE - Hydrologist (Medical): No    Lack of Transportation (Non-Medical): No  Physical Activity: Insufficiently Active (12/05/2021)   Exercise Vital Sign    Days of Exercise per Week: 3 days    Minutes of Exercise per Session: 30 min  Stress: No Stress Concern Present (12/05/2021)   [place]    Feeling of Stress : Not at all  Social Connections: Moderately Isolated (12/05/2021)   Social Connection and Isolation Panel [NHANES]    Frequency of Communication with Friends and Family: More than three times a week    Frequency of Social Gatherings with Friends and Family: Three times a week    Attends Religious Services: Never    Active Member of Clubs or Organizations: No    Attends Archivist Meetings: Never    Marital Status: Married    Tobacco Counseling Counseling given: Not Answered   Clinical Intake:  Pre-visit preparation completed: Yes  Pain : No/denies pain     Nutritional Risks: None Diabetes: No  How often do you need to have someone help you when you read instructions, pamphlets, or other written materials from your doctor or pharmacy?: 1 - Never  Diabetic?no  Interpreter Needed?: No  Information entered by ::  Kirke Shaggy, LPN   Activities of Daily Living    12/05/2021   11:05 AM 09/09/2021   11:45 AM  In your present state of health, do you have any difficulty performing the following activities:  Hearing? 0 0  Vision? 0 1  Difficulty concentrating or making decisions? 0 0  Walking or climbing stairs? 0 0  Dressing or bathing? 0 0  Doing errands, shopping? 0 0  Preparing Food and eating ? N   Using the Toilet? N   In the past six months, have you accidently leaked urine? N   Do you have problems with loss of bowel control? N   Managing your Medications? N   Managing your Finances? N   Housekeeping or managing your Housekeeping? N     Patient Care Team: Jearld Fenton, NP as PCP - General (Internal Medicine)  Indicate any recent Medical Services you may have received from other than [facility] providers in the past year (date may be approximate).     Assessment:   This is a routine wellness examination for Janyth.  Hearing/Vision screen Hearing Screening - Comments:: No aids Vision Screening - Comments:: Wears glasses- Dr.Woodard  Dietary issues and exercise activities discussed: Current Exercise Habits: Home exercise routine, Type of exercise: walking, Time (Minutes): 30, Frequency (Times/Week): 3, Weekly Exercise (Minutes/Week): 90, Intensity: Mild   Goals Addressed             This Visit's Progress    DIET - EAT MORE FRUITS AND VEGETABLES        Depression Screen    12/05/2021   11:03 AM 09/09/2021   11:44 AM 08/14/2021    3:11 PM 07/24/2021    3:24 PM 06/04/2021    9:40 AM 09/12/2020   10:02 AM 08/09/2020    2:44 PM  PHQ 2/9 Scores  PHQ - 2 Score 0 0 0 0 0 2 6  PHQ- 9 Score 0 '1 1 3 3 8 20    '$ Fall Risk  12/05/2021   11:05 AM 09/09/2021   11:44 AM 08/14/2021    3:11 PM 07/24/2021    3:24 PM 06/04/2021    9:38 AM  Fall Risk   Falls in the past year? '1 1 1 '$ 0 0  Number falls in past yr: 0 0 0 0 0  Injury with Fall? 0 0 0 0 0  Risk for fall due to : History of fall(s)  History of fall(s) History of fall(s);No Fall Risks No Fall Risks No Fall Risks  Follow up Falls prevention discussed Falls evaluation completed Falls evaluation completed Falls evaluation completed Falls evaluation completed    [place]:  Any stairs in or around the home? No  If so, are there any without handrails? No  Home free of loose throw rugs in walkways, pet beds, electrical cords, etc? Yes  Adequate lighting in your home to reduce risk of falls? Yes   ASSISTIVE DEVICES UTILIZED TO PREVENT FALLS:  Life alert? No  Use of a cane, walker or w/c? No  Grab bars in the bathroom? Yes  Shower chair or bench in shower? Yes  Elevated toilet seat or a handicapped toilet? Yes    Cognitive Function:        12/05/2021   11:06 AM 07/16/2020   10:12 AM  6CIT Screen  What Year? 0 points 0 points  What month? 0 points 0 points  What time? 0 points 0 points  Count back from 20 0 points 0 points  Months in reverse 0 points 2 points  Repeat phrase 0 points 6 points  Total Score 0 points 8 points    Immunizations Immunization History  Administered Date(s) Administered   Influenza, Seasonal, Injecte, Preservative Fre 03/28/2013   Influenza,inj,Quad PF,6+ Mos 01/15/2014, 01/31/2015, 04/02/2016, 01/21/2017, 01/31/2018, 01/27/2019, 04/04/2020, 02/04/2021   Influenza-Unspecified 01/15/2014, 01/31/2015, 04/02/2016, 01/21/2017   Moderna Sars-Covid-2 Vaccination 10/10/2019, 11/09/2019   Tdap 09/09/2021    TDAP status: Up to date  Flu Vaccine status: Up to date  Pneumococcal vaccine status: Declined,  Education has been provided regarding the importance of this vaccine but patient still declined. Advised may receive this vaccine at local pharmacy or Health Dept. Aware to provide a copy of the vaccination record if obtained from local pharmacy or Health Dept. Verbalized acceptance and understanding.   Covid-19 vaccine status: Completed  vaccines  Qualifies for Shingles Vaccine? No   Zostavax completed No   Shingrix Completed?: No.    Education has been provided regarding the importance of this vaccine. Patient has been advised to call insurance company to determine out of pocket expense if they have not yet received this vaccine. Advised may also receive vaccine at local pharmacy or Health Dept. Verbalized acceptance and understanding.  Screening Tests Health Maintenance  Topic Date Due   Zoster Vaccines- Shingrix (1 of 2) Never done   MAMMOGRAM  02/08/2021   INFLUENZA VACCINE  12/02/2021   COVID-19 Vaccine (3 - Moderna risk series) 01/02/2022 (Originally 12/07/2019)   COLONOSCOPY (Pts 45-78yr Insurance coverage will need to be confirmed)  04/17/2028   TETANUS/TDAP  09/10/2031   Hepatitis C Screening  Completed   HIV Screening  Completed   HPV VACCINES  Aged Out   PAP SMEAR-Modifier  Discontinued    Health Maintenance  Health Maintenance Due  Topic Date Due   Zoster Vaccines- Shingrix (1 of 2) Never done   MAMMOGRAM  02/08/2021   INFLUENZA VACCINE  12/02/2021    Colorectal cancer screening:  Type of screening: Colonoscopy. Completed 04/17/21. Repeat every 7 years  Mammogram status: Completed 02/15/19. Repeat every year    Lung Cancer Screening: (Low Dose CT Chest recommended if Age 80-80 years, 30 pack-year currently smoking OR have quit w/in 15years.) does not qualify.    Additional Screening:  Hepatitis C Screening: does qualify; Completed 09/09/21  Vision Screening: Recommended annual ophthalmology exams for early detection of glaucoma and other disorders of the eye. Is the patient up to date with their annual eye exam?  Yes  Who is the provider or what is the name of the office in which the patient attends annual eye exams? Dr.Woodard If pt is not established with a provider, would they like to be referred to a provider to establish care? No .   Dental Screening: Recommended annual dental exams for  proper oral hygiene  Community Resource Referral / Chronic Care Management: CRR required this visit?  No   CCM required this visit?  No      Plan:     I have personally reviewed and noted the following in the patient's chart:   Medical and social history Use of alcohol, tobacco or illicit drugs  Current medications and supplements including opioid prescriptions.  Functional ability and status Nutritional status Physical activity Advanced directives List of other physicians Hospitalizations, surgeries, and ER visits in previous 12 months Vitals Screenings to include cognitive, depression, and falls Referrals and appointments  In addition, I have reviewed and discussed with patient certain preventive protocols, quality metrics, and best practice recommendations. A written personalized care plan for preventive services as well as general preventive health recommendations were provided to patient.     Dionisio David, LPN   05/10/6158   Nurse Notes: none

## 2021-12-15 ENCOUNTER — Encounter: Payer: Self-pay | Admitting: Family Medicine

## 2021-12-23 DIAGNOSIS — Z6831 Body mass index (BMI) 31.0-31.9, adult: Secondary | ICD-10-CM | POA: Diagnosis not present

## 2021-12-23 DIAGNOSIS — Z9221 Personal history of antineoplastic chemotherapy: Secondary | ICD-10-CM | POA: Diagnosis not present

## 2021-12-23 DIAGNOSIS — C210 Malignant neoplasm of anus, unspecified: Secondary | ICD-10-CM | POA: Diagnosis not present

## 2021-12-23 DIAGNOSIS — Z923 Personal history of irradiation: Secondary | ICD-10-CM | POA: Diagnosis not present

## 2022-01-21 ENCOUNTER — Encounter: Payer: Self-pay | Admitting: Internal Medicine

## 2022-01-21 ENCOUNTER — Other Ambulatory Visit: Payer: Self-pay | Admitting: Internal Medicine

## 2022-01-21 MED ORDER — CYCLOBENZAPRINE HCL 10 MG PO TABS: ORAL_TABLET | ORAL | 0 refills | Status: DC

## 2022-01-21 MED ORDER — TRAMADOL HCL 50 MG PO TABS: 50.0000 mg | ORAL_TABLET | Freq: Every day | ORAL | 0 refills | Status: DC | PRN

## 2022-01-21 NOTE — Telephone Encounter (Signed)
Requested medication (s) are due for refill today - no  Requested medication (s) are on the active medication list -yes  Future visit scheduled -no  Last refill: 01/21/22 #30  Notes to clinic: duplicate request- already filled today- non delegated Rx  Requested Prescriptions  Pending Prescriptions Disp Refills   traMADol (ULTRAM) 50 MG tablet [Pharmacy Med Name: traMADol HCl 50 MG Oral Tablet] 60 tablet 0    Sig: TAKE 1 TABLET BY MOUTH EVERY 12 HOURS AS NEEDED FOR  MODERATE  PAIN  (ABDOMINAL  OR  RECTAL  PAIN)     Not Delegated - Analgesics:  Opioid Agonists Failed - 01/21/2022 11:09 AM      Failed - This refill cannot be delegated      Failed - Urine Drug Screen completed in last 360 days      Passed - Valid encounter within last 3 months    Recent Outpatient Visits           4 months ago Encounter for general adult medical examination with abnormal findings   [place], Coralie Keens, NP   5 months ago Community acquired pneumonia of left lower lobe of lung   [facility] [place], [place] W, NP   6 months ago Intermittent left-sided chest pain   [facility] [place], [place], [place]   7 months ago Acute blood loss anemia   [facility] [place] Hauser, DO   11 months ago Bowel habit changes   [facility] [place], [place] W, NP                 Requested Prescriptions  Pending Prescriptions Disp Refills   traMADol (ULTRAM) 50 MG tablet [Pharmacy Med Name: traMADol HCl 50 MG Oral Tablet] 60 tablet 0    Sig: TAKE 1 TABLET BY MOUTH EVERY 12 HOURS AS NEEDED FOR  MODERATE  PAIN  (ABDOMINAL  OR  RECTAL  PAIN)     Not Delegated - Analgesics:  Opioid Agonists Failed - 01/21/2022 11:09 AM      Failed - This refill cannot be delegated      Failed - Urine Drug Screen completed in last 360 days      Passed - Valid encounter within last 3 months    Recent Outpatient Visits           4 months ago  Encounter for general adult medical examination with abnormal findings   [facility], Coralie Keens, NP   5 months ago Community acquired pneumonia of left lower lobe of lung   [facility] [place], [place] W, NP   6 months ago Intermittent left-sided chest pain   [place], [place]   7 months ago Acute blood loss anemia   [place], [place]   11 months ago Bowel habit changes   [facility] [place], Coralie Keens, [place]

## 2022-02-13 ENCOUNTER — Other Ambulatory Visit: Payer: Self-pay | Admitting: Internal Medicine

## 2022-02-13 NOTE — Telephone Encounter (Signed)
Requested medication (s) are due for refill today: yes  Requested medication (s) are on the active medication list: yes  Last refill:  01/21/22 #30/0  Future visit scheduled: no  Notes to clinic:  Unable to refill per protocol, cannot delegate.    Requested Prescriptions  Pending Prescriptions Disp Refills   traMADol (ULTRAM) 50 MG tablet [Pharmacy Med Name: traMADol HCl 50 MG Oral Tablet] 30 tablet 0    Sig: TAKE 1 TABLET BY MOUTH ONCE DAILY AS NEEDED     Not Delegated - Analgesics:  Opioid Agonists Failed - 02/13/2022 10:59 AM      Failed - This refill cannot be delegated      Failed - Urine Drug Screen completed in last 360 days      Passed - Valid encounter within last 3 months    Recent Outpatient Visits           5 months ago Encounter for general adult medical examination with abnormal findings   [facility] [place], Coralie Keens, NP   6 months ago Community acquired pneumonia of left lower lobe of lung   [facility] [place], [place], NP   6 months ago Intermittent left-sided chest pain   [place], [place]   8 months ago Acute blood loss anemia   [place], DO   1 year ago Bowel habit changes   [facility] [place], Coralie Keens, NP

## 2022-02-16 ENCOUNTER — Ambulatory Visit: Payer: Self-pay

## 2022-02-16 NOTE — Telephone Encounter (Signed)
Message from Nani Ravens sent at 02/16/2022 11:28 AM EDT  Summary: sore throat, knot on neck   Pt called in for assistance. Pt says that she has a lump on the side of her neck and a sore throat. Advised pt of PCP next opening (tomorrow) pt says that she cant wait until tomorrow to be seen. Pt would like to know if PCP could work her in today?          Chief Complaint:  severe throat pain Symptoms: left neck swollen lymph node, ear pain, back of tongue sore and purple in color, hurts to move tongue Frequency: past weekend Pertinent Negatives: Patient denies difficulty breathing, rash, headache Disposition: '[]'$ ED /'[]'$ Urgent Care (no appt availability in office) / '[x]'$ Appointment(In office/virtual)/ '[]'$   Virtual Care/ '[]'$ Home Care/ '[]'$ Refused Recommended Disposition /'[]'$  Mobile Bus/ '[]'$  Follow-up with PCP Additional Notes: could not find appt within 24 hours. Only appt available Wednesday. Appt made and wait listed. Pt requesting a sooner appt.   Reason for Disposition  Earache also present  Answer Assessment - Initial Assessment Questions 1. ONSET: "When did the throat start hurting?" (Hours or days ago)      weekend 2. SEVERITY: "How bad is the sore throat?" (Scale 1-10; mild, moderate or severe)   - MILD (1-3):  Doesn't interfere with eating or normal activities.   - MODERATE (4-7): Interferes with eating some solids and normal activities.   - SEVERE (8-10):  Excruciating pain, interferes with most normal activities.   - SEVERE WITH DYSPHAGIA (10): Can't swallow liquids, drooling.     severe 3. STREP EXPOSURE: "Has there been any exposure to strep within the past week?" If Yes, ask: "What type of contact occurred?"      No  4.  VIRAL SYMPTOMS: "Are there any symptoms of a cold, such as a runny nose, cough, hoarse voice or red eyes?"      no 5. FEVER: "Do you have a fever?" If Yes, ask: "What is your temperature, how was it measured, and when did it start?"      no 6. PUS ON THE TONSILS: "Is there pus on the tonsils in the back of your throat?"     No-  7. OTHER SYMPTOMS: "Do you have any other symptoms?" (e.g., difficulty breathing, headache, rash)     Knot left large present x 2 months, bottom of tongue hurts,color of back to tongue a purple in color and hurts to move tongue hurts to ear 8. PREGNANCY: "Is there any chance you are pregnant?" "When was your last menstrual period?"     N/a  Protocols used: Sore Throat-A-AH

## 2022-02-18 ENCOUNTER — Ambulatory Visit: Payer: BC Managed Care – PPO | Admitting: Internal Medicine

## 2022-02-18 ENCOUNTER — Encounter: Payer: Self-pay | Admitting: Internal Medicine

## 2022-02-18 VITALS — BP 126/80 | HR 94 | Temp 97.7°F | Wt 189.0 lb

## 2022-02-18 DIAGNOSIS — K047 Periapical abscess without sinus: Secondary | ICD-10-CM | POA: Diagnosis not present

## 2022-02-18 DIAGNOSIS — E559 Vitamin D deficiency, unspecified: Secondary | ICD-10-CM | POA: Diagnosis not present

## 2022-02-18 DIAGNOSIS — R5383 Other fatigue: Secondary | ICD-10-CM

## 2022-02-18 DIAGNOSIS — E538 Deficiency of other specified B group vitamins: Secondary | ICD-10-CM | POA: Diagnosis not present

## 2022-02-18 DIAGNOSIS — K140 Glossitis: Secondary | ICD-10-CM

## 2022-02-18 DIAGNOSIS — E039 Hypothyroidism, unspecified: Secondary | ICD-10-CM

## 2022-02-18 NOTE — Patient Instructions (Signed)
Fatigue If you have fatigue, you feel tired all the time and have a lack of energy or a lack of motivation. Fatigue may make it difficult to start or complete tasks because of exhaustion. Occasional or mild fatigue is often a normal response to activity or life. However, long-term (chronic) or extreme fatigue may be a symptom of a medical condition such as: Depression. Not having enough red blood cells or hemoglobin in the blood (anemia). A problem with a small gland located in the lower front part of the neck (thyroid disorder). Rheumatologic conditions. These are problems related to the body's defense system (immune system). Infections, especially certain viral infections. Fatigue can also lead to negative health outcomes over time. Follow these instructions at home: Medicines Take over-the-counter and prescription medicines only as told by your health care provider. Take a multivitamin if told by your health care provider. Do not use herbal or dietary supplements unless they are approved by your health care provider. Eating and drinking  Avoid heavy meals in the evening. Eat a well-balanced diet, which includes lean proteins, whole grains, plenty of fruits and vegetables, and low-fat dairy products. Avoid eating or drinking too many products with caffeine in them. Avoid alcohol. Drink enough fluid to keep your urine pale yellow. Activity  Exercise regularly, as told by your health care provider. Use or practice techniques to help you relax, such as yoga, tai chi, meditation, or massage therapy. Lifestyle Change situations that cause you stress. Try to keep your work and personal schedules in balance. Do not use recreational or illegal drugs. General instructions Monitor your fatigue for any changes. Go to bed and get up at the same time every day. Avoid fatigue by pacing yourself during the day and getting enough sleep at night. Maintain a healthy weight. Contact a health care  provider if: Your fatigue does not get better. You have a fever. You suddenly lose or gain weight. You have headaches. You have trouble falling asleep or sleeping through the night. You feel angry, guilty, anxious, or sad. You have swelling in your legs or another part of your body. Get help right away if: You feel confused, feel like you might faint, or faint. Your vision is blurry or you have a severe headache. You have severe pain in your abdomen, your back, or the area between your waist and hips (pelvis). You have chest pain, shortness of breath, or an irregular or fast heartbeat. You are unable to urinate, or you urinate less than normal. You have abnormal bleeding from the rectum, nose, lungs, nipples, or, if you are female, the vagina. You vomit blood. You have thoughts about hurting yourself or others. These symptoms may be an emergency. Get help right away. Call 911. Do not wait to see if the symptoms will go away. Do not drive yourself to the hospital. Get help right away if you feel like you may hurt yourself or others, or have thoughts about taking your own life. Go to your nearest emergency room or: Call 911. Call the [facility] at [phone] or 988. This is open 24 hours a day. Text the Crisis Text Line at 979-823-0385. Summary If you have fatigue, you feel tired all the time and have a lack of energy or a lack of motivation. Fatigue may make it difficult to start or complete tasks because of exhaustion. Long-term (chronic) or extreme fatigue may be a symptom of a medical condition. Exercise regularly, as told by your health care provider.  Change situations that cause you stress. Try to keep your work and personal schedules in balance. This information is not intended to replace advice given to you by your health care provider. Make sure you discuss any questions you have with your health care provider. Document Revised: 02/10/2021 Document  Reviewed: 02/10/2021 Elsevier Patient Education  2023 Elsevier Inc.  

## 2022-02-18 NOTE — Progress Notes (Signed)
Subjective:    Patient ID: Brenda Grimes, female    DOB: [birth year], 56 y.o.   MRN: [id number]  HPI  Patient presents to clinic today with complaint of sore throat and swollen lymph nodes.  This started 4 days ago. She reports she was seen by the dentist who evaluated her and advised her that she had an abscessed tooth and was placed on Amoxicillin. She reports she never had any dental pain. She does report she is feeling better.  She also reports fatigue. She reports this started 1 month ago. She feels like she is sleeping well at night. She does not snore that she is aware of. She does not nap during the day.  She does not feel overly anxious or depressed.  She did stop her vitamin D and B12 supplements because this was causing worsening diarrhea and she feels like this may be the contributing factor.  Review of Systems     Past Medical History:  Diagnosis Date   Arthritis    CAD (coronary artery disease)    s/p stenting x 2   Cancer (HCC)    anus cancer   Colon polyp    Depression    Hyperlipidemia    MI (myocardial infarction) ([place])    Thyroid disease    Vitamin D deficiency     Current Outpatient Medications  Medication Sig Dispense Refill   acetaminophen (TYLENOL) 500 MG tablet Take 1,000 mg by mouth every 6 (six) hours as needed.     azithromycin (ZITHROMAX) 250 MG tablet TAKE 2 TABLETS BY MOUTH ON DAY 1 AND THEN TAKE 1 TABLET BY MOUTH ONCE A DAY ON DAY 2 THROUGH DAY 5 (Patient not taking: Reported on 12/05/2021)     buPROPion (WELLBUTRIN XL) 150 MG 24 hr tablet Take 1 tablet by mouth once daily 90 tablet 1   Cholecalciferol (VITAMIN D-1000 MAX ST) 25 MCG (1000 UT) tablet Take by mouth. (Patient not taking: Reported on 12/05/2021)     cyclobenzaprine (FLEXERIL) 10 MG tablet Take 1 tablet by oral route at bedtime. 30 tablet 0   estradiol (ESTRACE) 0.1 MG/GM vaginal cream Place vaginally.     gabapentin (NEURONTIN) 300 MG capsule TAKE 2 CAPSULES BY MOUTH ONCE DAILY AND 3  NIGHTLY     ibuprofen (ADVIL) 200 MG tablet Take 200 mg by mouth every 6 (six) hours as needed.     ketoconazole (NIZORAL) 2 % cream Apply topically 2 (two) times daily.     levothyroxine (SYNTHROID) 75 MCG tablet Take 1 tablet by mouth daily before breakfast.     levothyroxine (SYNTHROID) 88 MCG tablet Take 1 tablet by mouth daily before breakfast.     loperamide (IMODIUM) 2 MG capsule Take 2 mg by mouth as needed for diarrhea or loose stools.     nitroGLYCERIN (NITROSTAT) 0.4 MG SL tablet Place under the tongue.     omeprazole (PRILOSEC OTC) 20 MG tablet Take 1 tablet (20 mg total) by mouth daily.     traMADol (ULTRAM) 50 MG tablet TAKE 1 TABLET BY MOUTH ONCE DAILY AS NEEDED 30 tablet 0   traZODone (DESYREL) 50 MG tablet TAKE 1 TABLET BY MOUTH AT BEDTIME START FOR FIRST 7 8 DAYS WITH 1 2 TABLET AT BEDTIME (Patient not taking: Reported on 12/05/2021)     triamcinolone cream (KENALOG) 0.1 % Apply topically 2 (two) times daily.     venlafaxine XR (EFFEXOR XR) 37.5 MG 24 hr capsule Take 1 capsule (37.5 mg total) by mouth  daily with breakfast. (Patient taking differently: Take 37.5 mg by mouth daily as needed.) 30 capsule 2   vitamin B-12 (CYANOCOBALAMIN) 1000 MCG tablet Take 1,000 mcg by mouth daily. (Patient not taking: Reported on 12/05/2021)     No current facility-administered medications for this visit.    Allergies  Allergen Reactions   Sulfa Antibiotics Rash   Sulfasalazine Rash   Tape Rash    Family History  Problem Relation Age of Onset   Alzheimer's disease Mother    CAD Father    Thyroid disease Brother    Hyperlipidemia Brother    Hyperlipidemia Brother    Alcohol abuse Brother    Thyroid disease Brother    Cirrhosis Brother    Alzheimer's disease Maternal Grandmother    Heart disease Paternal Grandmother    Heart disease Paternal Grandfather    Healthy Daughter    Healthy Son    Healthy Son    Heart disease Paternal Uncle    Breast cancer Cousin    Cancer Paternal  Aunt        Possible colon cancer    Social History   Socioeconomic History   Marital status: Married    Spouse name: Not on file   Number of children: Not on file   Years of education: Not on file   Highest education level: Not on file  Occupational History   Not on file  Tobacco Use   Smoking status: Former    Packs/day: 0.75    Years: 30.00    Total pack years: 22.50    Types: Cigarettes    Quit date: 01/21/2007    Years since quitting: 15.0   Smokeless tobacco: Never  Vaping Use   Vaping Use: Never used  Substance and Sexual Activity   Alcohol use: No   Drug use: No   Sexual activity: Yes    Birth control/protection: None  Other Topics Concern   Not on file  Social History Narrative   Not on file   Social Determinants of Health   Financial Resource Strain: Low Risk  (12/05/2021)   Overall Financial Resource Strain (CARDIA)    Difficulty of Paying Living Expenses: Not hard at all  Food Insecurity: No Food Insecurity (12/05/2021)   Hunger Vital Sign    Worried About Running Out of Food in the Last Year: Never true    Ran Out of Food in the Last Year: Never true  Transportation Needs: No Transportation Needs (12/05/2021)   PRAPARE - Hydrologist (Medical): No    Lack of Transportation (Non-Medical): No  Physical Activity: Insufficiently Active (12/05/2021)   Exercise Vital Sign    Days of Exercise per Week: 3 days    Minutes of Exercise per Session: 30 min  Stress: No Stress Concern Present (12/05/2021)   [place]    Feeling of Stress : Not at all  Social Connections: Moderately Isolated (12/05/2021)   Social Connection and Isolation Panel [NHANES]    Frequency of Communication with Friends and Family: More than three times a week    Frequency of Social Gatherings with Friends and Family: Three times a week    Attends Religious Services: Never    Active Member of Clubs or  Organizations: No    Attends Archivist Meetings: Never    Marital Status: Married  Human resources officer Violence: Not At Risk (12/05/2021)   Humiliation, Afraid, Rape, and Kick questionnaire  Fear of Current or Ex-Partner: No    Emotionally Abused: No    Physically Abused: No    Sexually Abused: No     Constitutional: Patient reports fatigue.  Denies fever, malaise, headache or abrupt weight changes.  HEENT: Pt reports sore throat, also abdominal. Denies eye pain, eye redness, ear pain, ringing in the ears, wax buildup, runny nose, nasal congestion, bloody nose. Respiratory: Denies difficulty breathing, shortness of breath, cough or sputum production.   Cardiovascular: Denies chest pain, chest tightness, palpitations or swelling in the hands or feet.  Neurological: Denies dizziness, difficulty with memory, difficulty with speech or problems with balance and coordination.  Psych: Denies anxiety, depression, SI/HI.  No other specific complaints in a complete review of systems (except as listed in HPI above).  Objective:   Physical Exam  BP 126/80 (BP Location: Left Arm, Patient Position: Sitting, Cuff Size: Large)   Pulse 94   Temp 97.7 F (36.5 C) (Temporal)   Wt 189 lb (85.7 kg)   SpO2 98%   BMI 34.57 kg/m   Wt Readings from Last 3 Encounters:  12/05/21 182 lb (82.6 kg)  09/09/21 182 lb (82.6 kg)  08/14/21 180 lb (81.6 kg)    General: Appears her stated age, obese, in NAD. Skin: Warm, dry and intact. No rashes noted. HEENT: Head: normal shape and size; Ears: Tm's gray and intact, normal light reflex; Nose: mucosa pink and moist, septum midline; Throat/Mouth: Teeth present, mucosa pink and moist, no exudate, lesions noted.  Ulcer noted of left inferior tongue. Neck: Anterior cervical adenopathy noted on the left. Cardiovascular: Normal rate and rhythm.  Pulmonary/Chest: Normal effort and positive vesicular breath sounds. No respiratory distress. No wheezes, rales or  ronchi noted.  Neurological: Alert and oriented.   BMET    Component Value Date/Time   NA 140 08/08/2021 1509   K 3.8 08/08/2021 1509   CL 106 08/08/2021 1509   CO2 26 08/08/2021 1509   GLUCOSE 89 08/08/2021 1509   BUN 16 08/08/2021 1509   CREATININE 0.75 08/08/2021 1509   CREATININE 0.76 07/24/2021 1010   CALCIUM 8.8 (L) 08/08/2021 1509   GFRNONAA >60 08/08/2021 1509   GFRNONAA 79 04/04/2020 0904   GFRAA 91 04/04/2020 0904    Lipid Panel     Component Value Date/Time   CHOL 194 07/24/2021 1006   TRIG 103 07/24/2021 1006   HDL 66 07/24/2021 1006   CHOLHDL 2.9 07/24/2021 1006   LDLCALC 108 (H) 07/24/2021 1006    CBC    Component Value Date/Time   WBC 3.7 (L) 08/08/2021 1509   RBC 3.62 (L) 08/08/2021 1509   HGB 11.8 (L) 08/08/2021 1509   HCT 36.7 08/08/2021 1509   PLT 188 08/08/2021 1509   MCV 101.4 (H) 08/08/2021 1509   MCH 32.6 08/08/2021 1509   MCHC 32.2 08/08/2021 1509   RDW 12.9 08/08/2021 1509   LYMPHSABS 921 07/24/2021 1010   MONOABS 0.3 04/15/2020 1209   EOSABS 60 07/24/2021 1010   BASOSABS 39 07/24/2021 1010    Hgb A1C Lab Results  Component Value Date   HGBA1C 5.2 09/09/2021           Assessment & Plan:  Abscessed Tooth, Ulcer of Tongue:  Continue Amoxicillin as previously prescribed She has Magic mouthwash to take as needed for the ulcer Advised her that lymph node swelling would likely improve with time  Fatigue, Vitamin D Deficiency, Vitamin B12 Deficiency, Hypothyroidism:  We will check CBC, TSH, free T4, vitamin  D and B12 today   RTC in 1 month for follow-up of chronic conditions Webb Silversmith, NP

## 2022-02-19 ENCOUNTER — Encounter: Payer: Self-pay | Admitting: Internal Medicine

## 2022-02-19 LAB — CBC
HCT: 37.2 % (ref 35.0–45.0)
Hemoglobin: 12.6 g/dL (ref 11.7–15.5)
MCH: 33.3 pg — ABNORMAL HIGH (ref 27.0–33.0)
MCHC: 33.9 g/dL (ref 32.0–36.0)
MCV: 98.4 fL (ref 80.0–100.0)
MPV: 9.4 fL (ref 7.5–12.5)
Platelets: 197 10*3/uL (ref 140–400)
RBC: 3.78 10*6/uL — ABNORMAL LOW (ref 3.80–5.10)
RDW: 12.3 % (ref 11.0–15.0)
WBC: 3.4 10*3/uL — ABNORMAL LOW (ref 3.8–10.8)

## 2022-02-19 LAB — VITAMIN B12: Vitamin B-12: 494 pg/mL (ref 200–1100)

## 2022-02-19 LAB — T4, FREE: Free T4: 1.1 ng/dL (ref 0.8–1.8)

## 2022-02-19 LAB — VITAMIN D 25 HYDROXY (VIT D DEFICIENCY, FRACTURES): Vit D, 25-Hydroxy: 28 ng/mL — ABNORMAL LOW (ref 30–100)

## 2022-02-19 LAB — TSH: TSH: 2.04 mIU/L (ref 0.40–4.50)

## 2022-02-24 ENCOUNTER — Encounter: Payer: Self-pay | Admitting: Internal Medicine

## 2022-03-04 ENCOUNTER — Other Ambulatory Visit: Payer: Self-pay | Admitting: Internal Medicine

## 2022-03-04 NOTE — Telephone Encounter (Signed)
Requested medication (s) are due for refill today: Amount not specified  Requested medication (s) are on the active medication list: yes    Last refill: 11/27/21  Future visit scheduled No  Notes to clinic:Historical Provider, please review. Thank you.  Requested Prescriptions  Pending Prescriptions Disp Refills   levothyroxine (SYNTHROID) 88 MCG tablet [Pharmacy Med Name: Levothyroxine Sodium 88 MCG Oral Tablet] 90 tablet 0    Sig: TAKE 1 TABLET BY MOUTH ONCE DAILY BEFORE BREAKFAST     Endocrinology:  Hypothyroid Agents Passed - 03/04/2022  9:16 AM      Passed - TSH in normal range and within 360 days    TSH  Date Value Ref Range Status  02/18/2022 2.04 0.40 - 4.50 mIU/L Final         Passed - Valid encounter within last 12 months    Recent Outpatient Visits           2 weeks ago Abscessed tooth   [facility] [place], Coralie Keens, NP   5 months ago Encounter for general adult medical examination with abnormal findings   [facility] [place], Coralie Keens, NP   6 months ago Community acquired pneumonia of left lower lobe of lung   [facility] [place], [place] W, NP   7 months ago Intermittent left-sided chest pain   [place], [place]   9 months ago Acute blood loss anemia   [place], Devonne Doughty, [place]

## 2022-03-13 ENCOUNTER — Ambulatory Visit: Payer: BC Managed Care – PPO | Admitting: Internal Medicine

## 2022-03-13 ENCOUNTER — Encounter: Payer: Self-pay | Admitting: Internal Medicine

## 2022-03-13 ENCOUNTER — Ambulatory Visit: Payer: Self-pay | Admitting: *Deleted

## 2022-03-13 VITALS — BP 126/84 | HR 86 | Temp 96.8°F | Wt 180.0 lb

## 2022-03-13 DIAGNOSIS — R197 Diarrhea, unspecified: Secondary | ICD-10-CM

## 2022-03-13 DIAGNOSIS — N950 Postmenopausal bleeding: Secondary | ICD-10-CM

## 2022-03-13 DIAGNOSIS — R1110 Vomiting, unspecified: Secondary | ICD-10-CM

## 2022-03-13 DIAGNOSIS — N898 Other specified noninflammatory disorders of vagina: Secondary | ICD-10-CM

## 2022-03-13 DIAGNOSIS — N895 Stricture and atresia of vagina: Secondary | ICD-10-CM | POA: Diagnosis not present

## 2022-03-13 DIAGNOSIS — J029 Acute pharyngitis, unspecified: Secondary | ICD-10-CM | POA: Diagnosis not present

## 2022-03-13 DIAGNOSIS — R8290 Unspecified abnormal findings in urine: Secondary | ICD-10-CM

## 2022-03-13 DIAGNOSIS — R051 Acute cough: Secondary | ICD-10-CM

## 2022-03-13 DIAGNOSIS — N390 Urinary tract infection, site not specified: Secondary | ICD-10-CM | POA: Diagnosis not present

## 2022-03-13 LAB — POCT URINALYSIS DIPSTICK
Bilirubin, UA: NEGATIVE
Glucose, UA: NEGATIVE
Ketones, UA: NEGATIVE
Nitrite, UA: NEGATIVE
Protein, UA: POSITIVE — AB
Spec Grav, UA: 1.02 (ref 1.010–1.025)
Urobilinogen, UA: 0.2 E.U./dL
pH, UA: 6 (ref 5.0–8.0)

## 2022-03-13 LAB — POC COVID19 BINAXNOW: SARS Coronavirus 2 Ag: NEGATIVE

## 2022-03-13 LAB — POCT INFLUENZA A/B
Influenza A, POC: NEGATIVE
Influenza B, POC: NEGATIVE

## 2022-03-13 MED ORDER — NITROFURANTOIN MONOHYD MACRO 100 MG PO CAPS: 100.0000 mg | ORAL_CAPSULE | Freq: Two times a day (BID) | ORAL | 0 refills | Status: DC

## 2022-03-13 NOTE — Telephone Encounter (Signed)
Reason for Disposition  Postmenopausal vaginal bleeding  Answer Assessment - Initial Assessment Questions 1. AMOUNT: "Describe the bleeding that you are having." "How much bleeding is there?"    - SPOTTING: spotting, or pinkish / brownish mucous discharge; does not fill panty liner or pad    - MILD:  less than 1 pad / hour; less than patient's usual menstrual bleeding   - MODERATE: 1-2 pads / hour; 1 menstrual cup every 6 hours; small-medium blood clots (e.g., pea, grape, small coin)   - SEVERE: soaking 2 or more pads/hour for 2 or more hours; 1 menstrual cup every 2 hours; bleeding not contained by pads or continuous red blood from vagina; large blood clots (e.g., golf ball, large coin)      Vaginal spotting.   Day before yesterday 1/2 teaspoon of bleeding.  Then spotting rest of the day.   Maybe size of nickel on tissue.   Happened all day.  I got really sick at work, vomited and had diarrhea.   Today I'm feeling bad and I'm still spotting.  I'm hurting in my lower abd but that's all the time.   It's not new.    My urine smells and I'm wearing pads. 2. ONSET: "When did the bleeding begin?" "Is it continuing now?"     3 days ago during the night.  I woke up and it was spotting.   Not had a hysterectomy.      No hormone replacement.    Today I have a sore throat and I feel bad.  Mild cough dizzy  right before I vomited.    No longer having diarrhea or vomiting.  The vaginal bleeding/spotting is what is concerning.    3. MENOPAUSE: "When was your last menstrual period?"      8-9 yrs ago. I called [facility] to make an appt.   They will call me back. 4. ABDOMEN PAIN: "Do you have any pain?" "How bad is the pain?"  (e.g., Scale 1-10; mild, moderate, or severe)   - MILD (1-3): doesn't interfere with normal activities, abdomen soft and not tender to touch    - MODERATE (4-7): interferes with normal activities or awakens from sleep, abdomen tender to touch    - SEVERE (8-10): excruciating pain,  doubled over, unable to do any normal activities      Yes but it's chronic.   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 5. BLOOD THINNERS: "Do you take any blood thinners?" (e.g., Coumadin/warfarin, Pradaxa/dabigatran, aspirin)     No 6. HORMONE MEDICINES: "Are you taking any hormone medicines, prescription or OTC?" (e.g., birth control pills, estrogen)     No 7. CAUSE: "What do you think is causing the bleeding?" (e.g., recent gyn surgery, recent gyn procedure; known bleeding disorder, uterine cancer)       I don't know 8. HEMODYNAMIC STATUS: "Are you weak or feeling lightheaded?" If Yes, ask: "Can you stand and walk normally?"       Dizzy once. 9. OTHER SYMPTOMS: "What other symptoms are you having with the bleeding?" (e.g., back pain, burning with urination, fever)     Sore throat, feel bad all over nasal congestion.  Protocols used: Vaginal Bleeding - Postmenopausal-A-AH

## 2022-03-13 NOTE — Progress Notes (Signed)
Subjective:    Patient ID: Brenda Grimes, female    DOB: April 04, 1966, 56 y.o.   MRN: [id number]  HPI  Patient presents to clinic today with complaint of vaginal spotting.  She noticed this 3 days ago. She reports associated vaginal discharge and urine odor.  She reports she is postmenopausal, last menses 8-9 years ago. Her last pap was 02/2021. She has a history of vaginal stenosis, follows with urogynecology at [facility].  She also reports sore throat, cough, vomiting, diarrhea and body aches. This started 2 days ago. She is not having trouble swallowing. The cough is non productive. She has not vomited today. She denies headache, runny nose, nasal congestion, ear pain, shortness of breath. She has tried Ibuprofen OTC with minimal relief of symptoms. She has not had sick contacts that she is aware of.  Review of Systems     Past Medical History:  Diagnosis Date   Arthritis    CAD (coronary artery disease)    s/p stenting x 2   Cancer (HCC)    anus cancer   Colon polyp    Depression    Hyperlipidemia    MI (myocardial infarction) ([place])    Thyroid disease    Vitamin D deficiency     Current Outpatient Medications  Medication Sig Dispense Refill   acetaminophen (TYLENOL) 500 MG tablet Take 1,000 mg by mouth every 6 (six) hours as needed.     amoxicillin (AMOXIL) 500 MG capsule Take 500 mg by mouth 3 (three) times daily. For 7 days     buPROPion (WELLBUTRIN XL) 150 MG 24 hr tablet Take 1 tablet by mouth once daily 90 tablet 1   cyclobenzaprine (FLEXERIL) 10 MG tablet Take 1 tablet by oral route at bedtime. 30 tablet 0   estradiol (ESTRACE) 0.1 MG/GM vaginal cream Place vaginally.     gabapentin (NEURONTIN) 300 MG capsule TAKE 2 CAPSULES BY MOUTH ONCE DAILY AND 3 NIGHTLY     ibuprofen (ADVIL) 200 MG tablet Take 200 mg by mouth every 6 (six) hours as needed.     ketoconazole (NIZORAL) 2 % cream Apply topically 2 (two) times daily.     levothyroxine (SYNTHROID) 88 MCG tablet TAKE 1 TABLET  BY MOUTH ONCE DAILY BEFORE BREAKFAST 90 tablet 0   loperamide (IMODIUM) 2 MG capsule Take 2 mg by mouth as needed for diarrhea or loose stools.     nitroGLYCERIN (NITROSTAT) 0.4 MG SL tablet Place under the tongue.     omeprazole (PRILOSEC OTC) 20 MG tablet Take 1 tablet (20 mg total) by mouth daily.     traMADol (ULTRAM) 50 MG tablet TAKE 1 TABLET BY MOUTH ONCE DAILY AS NEEDED 30 tablet 0   venlafaxine XR (EFFEXOR XR) 37.5 MG 24 hr capsule Take 1 capsule (37.5 mg total) by mouth daily with breakfast. (Patient taking differently: Take 37.5 mg by mouth daily as needed.) 30 capsule 2   No current facility-administered medications for this visit.    Allergies  Allergen Reactions   Sulfa Antibiotics Rash   Sulfasalazine Rash   Tape Rash    Family History  Problem Relation Age of Onset   Alzheimer's disease Mother    CAD Father    Thyroid disease Brother    Hyperlipidemia Brother    Hyperlipidemia Brother    Alcohol abuse Brother    Thyroid disease Brother    Cirrhosis Brother    Alzheimer's disease Maternal Grandmother    Heart disease Paternal Grandmother    Heart  disease Paternal Grandfather    Healthy Daughter    Healthy Son    Healthy Son    Heart disease Paternal Uncle    Breast cancer Cousin    Cancer Paternal Aunt        Possible colon cancer    Social History   Socioeconomic History   Marital status: Married    Spouse name: Not on file   Number of children: Not on file   Years of education: Not on file   Highest education level: Not on file  Occupational History   Not on file  Tobacco Use   Smoking status: Former    Packs/day: 0.75    Years: 30.00    Total pack years: 22.50    Types: Cigarettes    Quit date: 01/21/2007    Years since quitting: 15.1   Smokeless tobacco: Never  Vaping Use   Vaping Use: Never used  Substance and Sexual Activity   Alcohol use: No   Drug use: No   Sexual activity: Yes    Birth control/protection: None  Other Topics  Concern   Not on file  Social History Narrative   Not on file   Social Determinants of Health   Financial Resource Strain: Low Risk  (12/05/2021)   Overall Financial Resource Strain (CARDIA)    Difficulty of Paying Living Expenses: Not hard at all  Food Insecurity: No Food Insecurity (12/05/2021)   Hunger Vital Sign    Worried About Running Out of Food in the Last Year: Never true    Ran Out of Food in the Last Year: Never true  Transportation Needs: No Transportation Needs (12/05/2021)   PRAPARE - Hydrologist (Medical): No    Lack of Transportation (Non-Medical): No  Physical Activity: Insufficiently Active (12/05/2021)   Exercise Vital Sign    Days of Exercise per Week: 3 days    Minutes of Exercise per Session: 30 min  Stress: No Stress Concern Present (12/05/2021)   [place]    Feeling of Stress : Not at all  Social Connections: Moderately Isolated (12/05/2021)   Social Connection and Isolation Panel [NHANES]    Frequency of Communication with Friends and Family: More than three times a week    Frequency of Social Gatherings with Friends and Family: Three times a week    Attends Religious Services: Never    Active Member of Clubs or Organizations: No    Attends Archivist Meetings: Never    Marital Status: Married  Human resources officer Violence: Not At Risk (12/05/2021)   Humiliation, Afraid, Rape, and Kick questionnaire    Fear of Current or Ex-Partner: No    Emotionally Abused: No    Physically Abused: No    Sexually Abused: No     Constitutional: Denies fever, malaise, fatigue, headache or abrupt weight changes.  HEENT: Pt reports sore throat. Denies eye pain, eye redness, ear pain, ringing in the ears, wax buildup, runny nose, nasal congestion, bloody nose Respiratory: Pt reports cough. Denies difficulty breathing, shortness of breath, sputum production.   Cardiovascular:  Denies chest pain, chest tightness, palpitations or swelling in the hands or feet.  Gastrointestinal: Pt reports pelvic pain, vomiting and diarrhea. Denies bloating, constipation, or blood in the stool.  GU: Patient reports vaginal spotting, discharge and urine odor.  Denies urgency, frequency, pain with urination, burning sensation, blood in urine. Musculoskeletal: Pt reports body aches. Denies decrease in range  of motion, difficulty with gait, or joint swelling.  Skin: Denies redness, rashes, lesions or ulcercations.  Neurological: Denies dizziness, difficulty with memory, difficulty with speech or problems with balance and coordination.    No other specific complaints in a complete review of systems (except as listed in HPI above).  Objective:   Physical Exam  BP 126/84 (BP Location: Left Arm, Patient Position: Sitting, Cuff Size: Normal)   Pulse 86   Temp (!) 96.8 F (36 C) (Temporal)   Wt 180 lb (81.6 kg)   SpO2 99%   BMI 32.92 kg/m   Wt Readings from Last 3 Encounters:  02/18/22 189 lb (85.7 kg)  12/05/21 182 lb (82.6 kg)  09/09/21 182 lb (82.6 kg)    General: Appears her stated age, obese in NAD. HEENT: Head: normal shape and size, no sinus tenderness noted.; Eyes: sclera white, no icterus, conjunctiva pink, PERRLA and EOMs intact; Throat/Mouth: Teeth present, mucosa erythematous and moist, no exudate, lesions or ulcerations noted.  Neck: No adenopathy noted. Cardiovascular: Normal rate and rhythm. S1,S2 noted.  No murmur, rubs or gallops noted.  Pulmonary/Chest: Normal effort and positive vesicular breath sounds. No respiratory distress. No wheezes, rales or ronchi noted.  Abdomen: Normal bowel sounds.  No CVA tenderness noted.  No difficulty with gait.  Neurological: Alert and oriented.   BMET    Component Value Date/Time   NA 140 08/08/2021 1509   K 3.8 08/08/2021 1509   CL 106 08/08/2021 1509   CO2 26 08/08/2021 1509   GLUCOSE 89 08/08/2021 1509   BUN 16  08/08/2021 1509   CREATININE 0.75 08/08/2021 1509   CREATININE 0.76 07/24/2021 1010   CALCIUM 8.8 (L) 08/08/2021 1509   GFRNONAA >60 08/08/2021 1509   GFRNONAA 79 04/04/2020 0904   GFRAA 91 04/04/2020 0904    Lipid Panel     Component Value Date/Time   CHOL 194 07/24/2021 1006   TRIG 103 07/24/2021 1006   HDL 66 07/24/2021 1006   CHOLHDL 2.9 07/24/2021 1006   LDLCALC 108 (H) 07/24/2021 1006    CBC    Component Value Date/Time   WBC 3.4 (L) 02/18/2022 1058   RBC 3.78 (L) 02/18/2022 1058   HGB 12.6 02/18/2022 1058   HCT 37.2 02/18/2022 1058   PLT 197 02/18/2022 1058   MCV 98.4 02/18/2022 1058   MCH 33.3 (H) 02/18/2022 1058   MCHC 33.9 02/18/2022 1058   RDW 12.3 02/18/2022 1058   LYMPHSABS 921 07/24/2021 1010   MONOABS 0.3 04/15/2020 1209   EOSABS 60 07/24/2021 1010   BASOSABS 39 07/24/2021 1010    Hgb A1C Lab Results  Component Value Date   HGBA1C 5.2 09/09/2021           Assessment & Plan:   Postmenopausal Vaginal Bleeding, Vaginal Stenosis, Vaginal Discharge, Urine Odor:  Urinalysis: Moderate leuks, moderate blood Rx for Macrobid 100 mg twice daily x5 days We will send urine culture Push fluids We will obtain pelvic/transvaginal uterine ultrasound Referral to GYN for further evaluation  Sore Throat, Cough, Vomiting and Diarrhea:  Rapid flu: Negative Rapid COVID: Negative Encourage rest and fluids Take Ibuprofen 600 mg every 8 hours as needed for pain and inflammation-consume with food Schedule a follow-up of chronic conditions Webb Silversmith, NP

## 2022-03-13 NOTE — Telephone Encounter (Signed)
  Chief Complaint: post menopausal spotting/bleeding Symptoms: bright red blood mixed with mucus discharge.  Still has uterus and ovaries.   She is also not feeling well.   Aches all over, sore throat and some mild coughing. Frequency: 3 days ago it started the vaginal bleeding Pertinent Negatives: Patient denies heavy bleeding Disposition: '[]'$ ED /'[]'$ Urgent Care (no appt availability in office) / '[x]'$ Appointment(In office/virtual)/ '[]'$  [facility]/ '[]'$ Home Care/ '[]'$ Refused Recommended Disposition /'[]'$  Mobile Bus/ '[]'$  Follow-up with PCP Additional Notes: Appt made for today with Webb Silversmith, NP

## 2022-03-13 NOTE — Patient Instructions (Signed)
Postmenopausal Bleeding Postmenopausal bleeding is any bleeding that occurs after menopause. Menopause is a time in a woman's life when monthly periods stop. Any type of bleeding after menopause should be checked by your doctor. Treatment will depend on the cause. This kind of bleeding can be caused by: Taking hormones during menopause. Low or high amounts of female hormones in the body. This can cause the lining of the womb (uterus) to become too thin or too thick. Cancer. Growths in the womb that are not cancer. Follow these instructions at home:  Watch for any changes in your symptoms. Let your doctor know about them. Avoid using tampons and douches as told by your doctor. Change your pads regularly. Get regular pelvic exams. This includes Pap tests. Take iron pills as told by your doctor. Take over-the-counter and prescription medicines only as told by your doctor. Keep all follow-up visits. Contact a doctor if: You have new bleeding from the vagina after menopause. You have pain in your belly (abdomen). Get help right away if: You have a fever or chills. You have very bad pain with bleeding. You have clumps of blood (blood clots) coming from your vagina. You have a lot of bleeding, and: You use more than 1 pad an hour. This kind of bleeding has never happened before. You have headaches. You feel dizzy or you feel like you are going to pass out (faint). Summary Any type of bleeding after menopause should be checked by your doctor. Avoid using tampons or douches. Get regular pelvic exams. This includes Pap tests. Contact a doctor if you have new bleeding or pain in your belly. Watch for any changes in your symptoms. Let your doctor know about them. This information is not intended to replace advice given to you by your health care provider. Make sure you discuss any questions you have with your health care provider. Document Revised: 10/05/2019 Document Reviewed:  10/05/2019 Elsevier Patient Education  2023 Elsevier Inc.  

## 2022-03-16 ENCOUNTER — Ambulatory Visit (INDEPENDENT_AMBULATORY_CARE_PROVIDER_SITE_OTHER): Payer: BC Managed Care – PPO

## 2022-03-16 ENCOUNTER — Encounter: Payer: Self-pay | Admitting: Internal Medicine

## 2022-03-16 ENCOUNTER — Other Ambulatory Visit: Payer: Self-pay | Admitting: Obstetrics & Gynecology

## 2022-03-16 DIAGNOSIS — N950 Postmenopausal bleeding: Secondary | ICD-10-CM | POA: Diagnosis not present

## 2022-03-16 DIAGNOSIS — F339 Major depressive disorder, recurrent, unspecified: Secondary | ICD-10-CM

## 2022-03-16 LAB — URINE CULTURE
MICRO NUMBER:: 14173450
SPECIMEN QUALITY:: ADEQUATE

## 2022-03-17 ENCOUNTER — Encounter: Payer: Self-pay | Admitting: Obstetrics & Gynecology

## 2022-03-17 ENCOUNTER — Ambulatory Visit (INDEPENDENT_AMBULATORY_CARE_PROVIDER_SITE_OTHER): Payer: BC Managed Care – PPO | Admitting: Obstetrics & Gynecology

## 2022-03-17 VITALS — BP 121/69 | HR 69 | Ht 63.0 in | Wt 181.0 lb

## 2022-03-17 DIAGNOSIS — R319 Hematuria, unspecified: Secondary | ICD-10-CM | POA: Diagnosis not present

## 2022-03-17 MED ORDER — CIPROFLOXACIN HCL 500 MG PO TABS: 500.0000 mg | ORAL_TABLET | Freq: Two times a day (BID) | ORAL | 0 refills | Status: AC

## 2022-03-17 NOTE — Addendum Note (Signed)
Addended by: Jearld Fenton on: 03/17/2022 09:36 AM   Modules accepted: Orders

## 2022-03-17 NOTE — Progress Notes (Signed)
   Established Patient Office Visit  Subjective   Patient ID: Brenda Grimes, female    DOB: 1965-08-11  Age: 56 y.o. MRN: [id number]  Chief Complaint  Patient presents with   Vaginal Bleeding    4 days spotting     HPI    56 yo married P3 with a h/o T2N1 rectal cancer s/p chemoradiation (54 Gy with micromycin C and 5FU) last treated 09/21/2012. She comes with 4 days of vaginal spotting (PMB) last week. She had a similar episode in 2021. At that time she had a gyn ultrasound that showed a 3 mm endometrium.  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 She briefly tried vaginal estrogen but only a few days worth. She has not used vaginal dilators although they have been mentioned to her. She is unable to have intercourse and would be interested in this going forward.  She had a normal pap smear in 2022, mammogram has been ordered.  Objective:     BP 121/69 (BP Location: Right Arm, Patient Position: Sitting)   Pulse 69   Ht '5\' 3"'$  (1.6 m)   Wt 181 lb (82.1 kg)   BMI 32.06 kg/m    Physical Exam Well nourished, well hydrated White female, no apparent distress She is ambulating and conversing normally. Urine dip on 03/13/2022 showed moderate blood, mod leuk est  EG - marked atrophy Urethra appears normal Vagina is 2 inches in length with a wall of scarring, no visible openings (as in, I don't think the blood is coming from uterus)  TVS today was technically difficult. Endometrium no better visualized than with trans abdominal approach. 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 She may decide to restart these in an attempt to have vaginal IC  Echogenic ring on ultrasound  I will send a message to her oncologist at [facility] about this finding. Of note, she reports a visit  to her oncologist a few months ago with NED.  Problem List Items Addressed This Visit   None   No follow-ups on file.    Emily Filbert, MD

## 2022-03-18 ENCOUNTER — Telehealth: Payer: Self-pay

## 2022-03-18 NOTE — Telephone Encounter (Signed)
Pt calling; was seen yesterday by MCD; pt is confused about what to do.  [phone]  Adv pt of what MCD's note said about may try vaginal estrogen and dilators; note sent to oncologist and referral to urologist.

## 2022-03-19 ENCOUNTER — Telehealth: Payer: Self-pay

## 2022-03-19 DIAGNOSIS — R112 Nausea with vomiting, unspecified: Secondary | ICD-10-CM

## 2022-03-19 MED ORDER — ONDANSETRON HCL 4 MG PO TABS: 4.0000 mg | ORAL_TABLET | Freq: Three times a day (TID) | ORAL | 0 refills | Status: DC | PRN

## 2022-03-19 NOTE — Telephone Encounter (Signed)
Pt called reporting vomiting and feeling as if she is freezing for 1 week. She seen Dr Hulan Fray earlier this week and had a u/s Dr Hulan Fray was going to contact Pt's oncologist. Pts states they have not received anything  from her.  I advised pt to contact PCP for these symptoms and she said they told her to call us. I sent in Zofran to help with the Vomiting, She states she is going to the ER. I offered an appointment with Dr Hulan Fray when she returns but that was too long of a wait.

## 2022-03-20 ENCOUNTER — Other Ambulatory Visit: Payer: Self-pay | Admitting: Internal Medicine

## 2022-03-20 DIAGNOSIS — F339 Major depressive disorder, recurrent, unspecified: Secondary | ICD-10-CM

## 2022-03-20 MED ORDER — BUPROPION HCL ER (XL) 150 MG PO TB24: 150.0000 mg | ORAL_TABLET | Freq: Every day | ORAL | 1 refills | Status: DC

## 2022-03-20 NOTE — Addendum Note (Signed)
Addended by: Jearld Fenton on: 03/20/2022 02:05 PM   Modules accepted: Orders

## 2022-03-21 LAB — URINE CULTURE

## 2022-03-23 ENCOUNTER — Telehealth: Payer: Self-pay | Admitting: Obstetrics & Gynecology

## 2022-03-23 ENCOUNTER — Ambulatory Visit: Payer: BC Managed Care – PPO

## 2022-03-23 DIAGNOSIS — N3000 Acute cystitis without hematuria: Secondary | ICD-10-CM

## 2022-03-23 MED ORDER — CIPROFLOXACIN HCL 500 MG PO TABS: 500.0000 mg | ORAL_TABLET | Freq: Two times a day (BID) | ORAL | 0 refills | Status: DC

## 2022-03-23 NOTE — Telephone Encounter (Signed)
Cipro prescribed to treat UTI

## 2022-03-30 ENCOUNTER — Encounter: Payer: Self-pay | Admitting: Internal Medicine

## 2022-03-30 ENCOUNTER — Other Ambulatory Visit: Payer: Self-pay | Admitting: Obstetrics & Gynecology

## 2022-03-31 ENCOUNTER — Other Ambulatory Visit: Payer: Self-pay | Admitting: Internal Medicine

## 2022-03-31 ENCOUNTER — Telehealth: Payer: Self-pay

## 2022-03-31 MED ORDER — TRAMADOL HCL 50 MG PO TABS: 50.0000 mg | ORAL_TABLET | Freq: Every day | ORAL | 0 refills | Status: DC | PRN

## 2022-03-31 NOTE — Telephone Encounter (Signed)
Pt calling needing to know what's next since her ultrasound. Pts states Dr Brenda Grimes was supposed to call her oncologist. Can you look at her chart and advise? Or is she Okay to wait until Dr Brenda Grimes returns? Next week.

## 2022-03-31 NOTE — Telephone Encounter (Signed)
Pt states she thinks she needs a MRI from the ultrasound report, her Oncologist is [facility] oncology. She also said they told her she needed a biopsy before she could be seen there.

## 2022-03-31 NOTE — Telephone Encounter (Signed)
Pt states her Oncologist have not spoken to Dr Hulan Fray. Do I just tell the Pt to wait to speak with [facility] Next week? There is nothing we can do at the moment?

## 2022-03-31 NOTE — Telephone Encounter (Signed)
If she can let me know who her oncologist is, I can try to reach out

## 2022-03-31 NOTE — Telephone Encounter (Signed)
I am unable to determine if Dr. Hulan Fray was able to reach her Oncologist or not.  With regards to the lining of her uterus, it was not well visualized but did not appear to be thickened. Not necessarily concerned about her uterus being the cause of her bleeding.  May be vaginal related due to h/o radiatio and atrophy.  However there was an area behind her cervix (could also be near rectal area) that showed some calcification. May need to consider additional imaging but again this may also be based on her Oncologist's recommendations.

## 2022-04-03 DIAGNOSIS — C210 Malignant neoplasm of anus, unspecified: Secondary | ICD-10-CM | POA: Diagnosis not present

## 2022-04-03 DIAGNOSIS — D259 Leiomyoma of uterus, unspecified: Secondary | ICD-10-CM | POA: Diagnosis not present

## 2022-04-03 DIAGNOSIS — N858 Other specified noninflammatory disorders of uterus: Secondary | ICD-10-CM | POA: Diagnosis not present

## 2022-04-03 DIAGNOSIS — Z85048 Personal history of other malignant neoplasm of rectum, rectosigmoid junction, and anus: Secondary | ICD-10-CM | POA: Diagnosis not present

## 2022-04-03 DIAGNOSIS — N950 Postmenopausal bleeding: Secondary | ICD-10-CM | POA: Diagnosis not present

## 2022-04-07 NOTE — Telephone Encounter (Signed)
Ok sounds good. Thanks.

## 2022-04-14 ENCOUNTER — Ambulatory Visit: Payer: BC Managed Care – PPO | Admitting: Internal Medicine

## 2022-04-14 ENCOUNTER — Encounter: Payer: Self-pay | Admitting: Internal Medicine

## 2022-04-14 VITALS — BP 132/76 | HR 90 | Temp 97.7°F | Wt 182.0 lb

## 2022-04-14 DIAGNOSIS — E785 Hyperlipidemia, unspecified: Secondary | ICD-10-CM | POA: Diagnosis not present

## 2022-04-14 DIAGNOSIS — Z6832 Body mass index (BMI) 32.0-32.9, adult: Secondary | ICD-10-CM

## 2022-04-14 DIAGNOSIS — D701 Agranulocytosis secondary to cancer chemotherapy: Secondary | ICD-10-CM | POA: Diagnosis not present

## 2022-04-14 DIAGNOSIS — K219 Gastro-esophageal reflux disease without esophagitis: Secondary | ICD-10-CM

## 2022-04-14 DIAGNOSIS — E039 Hypothyroidism, unspecified: Secondary | ICD-10-CM

## 2022-04-14 DIAGNOSIS — E6609 Other obesity due to excess calories: Secondary | ICD-10-CM

## 2022-04-14 DIAGNOSIS — M5137 Other intervertebral disc degeneration, lumbosacral region: Secondary | ICD-10-CM

## 2022-04-14 DIAGNOSIS — I25119 Atherosclerotic heart disease of native coronary artery with unspecified angina pectoris: Secondary | ICD-10-CM | POA: Diagnosis not present

## 2022-04-14 DIAGNOSIS — F339 Major depressive disorder, recurrent, unspecified: Secondary | ICD-10-CM

## 2022-04-14 DIAGNOSIS — N895 Stricture and atresia of vagina: Secondary | ICD-10-CM

## 2022-04-14 DIAGNOSIS — Z6833 Body mass index (BMI) 33.0-33.9, adult: Secondary | ICD-10-CM

## 2022-04-14 DIAGNOSIS — K529 Noninfective gastroenteritis and colitis, unspecified: Secondary | ICD-10-CM

## 2022-04-14 DIAGNOSIS — N951 Menopausal and female climacteric states: Secondary | ICD-10-CM

## 2022-04-14 DIAGNOSIS — T451X5A Adverse effect of antineoplastic and immunosuppressive drugs, initial encounter: Secondary | ICD-10-CM | POA: Diagnosis not present

## 2022-04-14 DIAGNOSIS — Z85048 Personal history of other malignant neoplasm of rectum, rectosigmoid junction, and anus: Secondary | ICD-10-CM

## 2022-04-14 MED ORDER — ASPIRIN 81 MG PO TBEC: 81.0000 mg | DELAYED_RELEASE_TABLET | Freq: Every day | ORAL | 12 refills | Status: AC

## 2022-04-14 NOTE — Assessment & Plan Note (Signed)
Continue Tylenol, tramadol and gabapentin as needed

## 2022-04-14 NOTE — Assessment & Plan Note (Signed)
Encourage regular stretching and core strengthening Continue Tylenol, tramadol and gabapentin as needed

## 2022-04-14 NOTE — Assessment & Plan Note (Signed)
Avoid foods that trigger reflux Encourage weight loss as this can help reduce reflux symptoms Continue omeprazole

## 2022-04-14 NOTE — Assessment & Plan Note (Signed)
CBC today.  

## 2022-04-14 NOTE — Assessment & Plan Note (Signed)
In remission.

## 2022-04-14 NOTE — Assessment & Plan Note (Signed)
Encourage diet and exercise for weight loss 

## 2022-04-14 NOTE — Assessment & Plan Note (Signed)
Continue Imodium as needed

## 2022-04-14 NOTE — Assessment & Plan Note (Signed)
C-Met and lipid profile today Encouraged her to consume low-fat diet 

## 2022-04-14 NOTE — Progress Notes (Signed)
Subjective:    Patient ID: Brenda Grimes, female    DOB: [birth year], 56 y.o.   MRN: [id number]  HPI  Patient presents to clinic today for follow-up of chronic conditions.  HLD with CAD: Her last LDL was 108, triglycerides 103, 07/2021.  She is not currently taking any cholesterol-lowering medication or aspirin at this time.  She tries to consume low-fat diet.  Hypothyroidism: She denies any issues on her current dose of Levothyroxine.  She does not follow with endocrinology.  History of Anal Cancer, Chronic Abdominal Pain with Diarrhea: In remission.  Her symptoms are managed with Imodium, Zofran and Tramadol.  She continues to follow with GI.  OA/Chronic Pain: Mainly in her hips.  She takes Tylenol, Tramadol and Gabapentin (as needed) as prescribed.  She does not follow with orthopedics.  Depression/Hot Flashes: Chronic, managed on Venlafaxine and Bupropion.  She is not currently seeing a therapist.  She denies anxiety, SI/HI.  GERD: Triggered by spicy foods.  She takes Omeprazole as needed.  There is no upper GI on file.  Neutropenia: Her last WBC count was 3.4, 02/2022.  She does not follow with hematology.  Review of Systems     Past Medical History:  Diagnosis Date   Arthritis    CAD (coronary artery disease)    s/p stenting x 2   Cancer (HCC)    anus cancer   Colon polyp    Depression    Hyperlipidemia    MI (myocardial infarction) ([place])    Thyroid disease    Vitamin D deficiency     Current Outpatient Medications  Medication Sig Dispense Refill   acetaminophen (TYLENOL) 500 MG tablet Take 1,000 mg by mouth every 6 (six) hours as needed.     buPROPion (WELLBUTRIN XL) 150 MG 24 hr tablet Take 1 tablet (150 mg total) by mouth daily. 90 tablet 1   ciprofloxacin (CIPRO) 500 MG tablet Take 1 tablet (500 mg total) by mouth 2 (two) times daily. 14 tablet 0   cyclobenzaprine (FLEXERIL) 10 MG tablet Take 1 tablet by oral route at bedtime. 30 tablet 0   gabapentin  (NEURONTIN) 300 MG capsule TAKE 2 CAPSULES BY MOUTH ONCE DAILY AND 3 NIGHTLY     ibuprofen (ADVIL) 200 MG tablet Take 200 mg by mouth every 6 (six) hours as needed.     ketoconazole (NIZORAL) 2 % cream Apply topically 2 (two) times daily.     levothyroxine (SYNTHROID) 88 MCG tablet TAKE 1 TABLET BY MOUTH ONCE DAILY BEFORE BREAKFAST 90 tablet 0   loperamide (IMODIUM) 2 MG capsule Take 2 mg by mouth as needed for diarrhea or loose stools.     nitroGLYCERIN (NITROSTAT) 0.4 MG SL tablet Place under the tongue.     omeprazole (PRILOSEC OTC) 20 MG tablet Take 1 tablet (20 mg total) by mouth daily.     ondansetron (ZOFRAN) 4 MG tablet Take 1 tablet (4 mg total) by mouth every 8 (eight) hours as needed for nausea or vomiting. 20 tablet 0   traMADol (ULTRAM) 50 MG tablet Take 1 tablet (50 mg total) by mouth daily as needed. 30 tablet 0   venlafaxine XR (EFFEXOR XR) 37.5 MG 24 hr capsule Take 1 capsule (37.5 mg total) by mouth daily with breakfast. (Patient taking differently: Take 37.5 mg by mouth daily as needed.) 30 capsule 2   No current facility-administered medications for this visit.    Allergies  Allergen Reactions   Sulfa Antibiotics Rash   Sulfasalazine Rash  Tape Rash    Family History  Problem Relation Age of Onset   Alzheimer's disease Mother    CAD Father    Thyroid disease Brother    Hyperlipidemia Brother    Hyperlipidemia Brother    Alcohol abuse Brother    Thyroid disease Brother    Cirrhosis Brother    Alzheimer's disease Maternal Grandmother    Heart disease Paternal Grandmother    Heart disease Paternal Grandfather    Healthy Daughter    Healthy Son    Healthy Son    Heart disease Paternal Uncle    Breast cancer Cousin    Cancer Paternal Aunt        Possible colon cancer    Social History   Socioeconomic History   Marital status: Married    Spouse name: Not on file   Number of children: Not on file   Years of education: Not on file   Highest education  level: Not on file  Occupational History   Not on file  Tobacco Use   Smoking status: Former    Packs/day: 0.75    Years: 30.00    Total pack years: 22.50    Types: Cigarettes    Quit date: 01/21/2007    Years since quitting: 15.2   Smokeless tobacco: Never  Vaping Use   Vaping Use: Never used  Substance and Sexual Activity   Alcohol use: No   Drug use: No   Sexual activity: Yes    Birth control/protection: None  Other Topics Concern   Not on file  Social History Narrative   Not on file   Social Determinants of Health   Financial Resource Strain: Low Risk  (12/05/2021)   Overall Financial Resource Strain (CARDIA)    Difficulty of Paying Living Expenses: Not hard at all  Food Insecurity: No Food Insecurity (12/05/2021)   Hunger Vital Sign    Worried About Running Out of Food in the Last Year: Never true    Ran Out of Food in the Last Year: Never true  Transportation Needs: No Transportation Needs (12/05/2021)   PRAPARE - Hydrologist (Medical): No    Lack of Transportation (Non-Medical): No  Physical Activity: Insufficiently Active (12/05/2021)   Exercise Vital Sign    Days of Exercise per Week: 3 days    Minutes of Exercise per Session: 30 min  Stress: No Stress Concern Present (12/05/2021)   [place]    Feeling of Stress : Not at all  Social Connections: Moderately Isolated (12/05/2021)   Social Connection and Isolation Panel [NHANES]    Frequency of Communication with Friends and Family: More than three times a week    Frequency of Social Gatherings with Friends and Family: Three times a week    Attends Religious Services: Never    Active Member of Clubs or Organizations: No    Attends Archivist Meetings: Never    Marital Status: Married  Human resources officer Violence: Not At Risk (12/05/2021)   Humiliation, Afraid, Rape, and Kick questionnaire    Fear of Current or  Ex-Partner: No    Emotionally Abused: No    Physically Abused: No    Sexually Abused: No     Constitutional: Denies fever, malaise, fatigue, headache or abrupt weight changes.  HEENT: Denies eye pain, eye redness, ear pain, ringing in the ears, wax buildup, runny nose, nasal congestion, bloody nose, or sore throat. Respiratory: Denies difficulty  breathing, shortness of breath, cough or sputum production.   Cardiovascular: Denies chest pain, chest tightness, palpitations or swelling in the hands or feet.  Gastrointestinal: Patient reports chronic abdominal pain and diarrhea.  Denies bloating, constipation,or blood in the stool.  GU: Denies urgency, frequency, pain with urination, burning sensation, blood in urine, odor or discharge. Musculoskeletal: Patient reports chronic joint pain.  Denies decrease in range of motion, difficulty with gait, muscle pain or joint swelling.  Skin: Denies redness, rashes, lesions or ulcercations.  Neurological: Denies dizziness, difficulty with memory, difficulty with speech or problems with balance and coordination.  Psych: Patient has a history of depression.  Denies anxiety, SI/HI.  No other specific complaints in a complete review of systems (except as listed in HPI above).  Objective:   Physical Exam  BP 132/76 (BP Location: Left Arm, Patient Position: Sitting, Cuff Size: Normal)   Pulse 90   Temp 97.7 F (36.5 C) (Temporal)   Wt 182 lb (82.6 kg)   SpO2 99%   BMI 32.24 kg/m   Wt Readings from Last 3 Encounters:  03/17/22 181 lb (82.1 kg)  03/13/22 180 lb (81.6 kg)  02/18/22 189 lb (85.7 kg)    General: Appears her stated age, obese in NAD. Skin: Warm, dry and intact.  HEENT: Head: normal shape and size; Eyes: sclera white, no icterus, conjunctiva pink, PERRLA and EOMs intact;  Neck:  Neck supple, trachea midline. No masses, lumps or thyromegaly present.  Cardiovascular: Normal rate and rhythm. S1,S2 noted.  No murmur, rubs or gallops  noted. No JVD or BLE edema. No carotid bruits noted. Pulmonary/Chest: Normal effort and positive vesicular breath sounds. No respiratory distress. No wheezes, rales or ronchi noted.  Abdomen: Soft and tender in the suprapubic region. Normal bowel sounds.  Musculoskeletal:  No difficulty with gait.  Neurological: Alert and oriented.  Coordination normal.  Psychiatric: Mood and affect normal. Behavior is normal. Judgment and thought content normal.     BMET    Component Value Date/Time   NA 140 08/08/2021 1509   K 3.8 08/08/2021 1509   CL 106 08/08/2021 1509   CO2 26 08/08/2021 1509   GLUCOSE 89 08/08/2021 1509   BUN 16 08/08/2021 1509   CREATININE 0.75 08/08/2021 1509   CREATININE 0.76 07/24/2021 1010   CALCIUM 8.8 (L) 08/08/2021 1509   GFRNONAA >60 08/08/2021 1509   GFRNONAA 79 04/04/2020 0904   GFRAA 91 04/04/2020 0904    Lipid Panel     Component Value Date/Time   CHOL 194 07/24/2021 1006   TRIG 103 07/24/2021 1006   HDL 66 07/24/2021 1006   CHOLHDL 2.9 07/24/2021 1006   LDLCALC 108 (H) 07/24/2021 1006    CBC    Component Value Date/Time   WBC 3.4 (L) 02/18/2022 1058   RBC 3.78 (L) 02/18/2022 1058   HGB 12.6 02/18/2022 1058   HCT 37.2 02/18/2022 1058   PLT 197 02/18/2022 1058   MCV 98.4 02/18/2022 1058   MCH 33.3 (H) 02/18/2022 1058   MCHC 33.9 02/18/2022 1058   RDW 12.3 02/18/2022 1058   LYMPHSABS 921 07/24/2021 1010   MONOABS 0.3 04/15/2020 1209   EOSABS 60 07/24/2021 1010   BASOSABS 39 07/24/2021 1010    Hgb A1C Lab Results  Component Value Date   HGBA1C 5.2 09/09/2021            Assessment & Plan:    RTC in 6 months for your annual exam Webb Silversmith, NP

## 2022-04-14 NOTE — Assessment & Plan Note (Signed)
Managed with venlafaxine

## 2022-04-14 NOTE — Assessment & Plan Note (Signed)
TSH and free T4 today We will adjust levothyroxine if needed based on labs 

## 2022-04-14 NOTE — Assessment & Plan Note (Signed)
Continue venlafaxine and bupropion Support offered

## 2022-04-14 NOTE — Assessment & Plan Note (Signed)
C-Met and lipid profile today We will have her start baby aspirin Encouraged her to consume a low-fat diet

## 2022-04-14 NOTE — Assessment & Plan Note (Signed)
Following with urogynecology

## 2022-04-15 LAB — COMPLETE METABOLIC PANEL WITH GFR
AG Ratio: 2.1 (calc) (ref 1.0–2.5)
ALT: 12 U/L (ref 6–29)
AST: 17 U/L (ref 10–35)
Albumin: 4.1 g/dL (ref 3.6–5.1)
Alkaline phosphatase (APISO): 77 U/L (ref 37–153)
BUN: 16 mg/dL (ref 7–25)
CO2: 28 mmol/L (ref 20–32)
Calcium: 9.2 mg/dL (ref 8.6–10.4)
Chloride: 108 mmol/L (ref 98–110)
Creat: 0.73 mg/dL (ref 0.50–1.03)
Globulin: 2 g/dL (calc) (ref 1.9–3.7)
Glucose, Bld: 112 mg/dL — ABNORMAL HIGH (ref 65–99)
Potassium: 4 mmol/L (ref 3.5–5.3)
Sodium: 143 mmol/L (ref 135–146)
Total Bilirubin: 0.5 mg/dL (ref 0.2–1.2)
Total Protein: 6.1 g/dL (ref 6.1–8.1)
eGFR: 95 mL/min/{1.73_m2} (ref >=60)

## 2022-04-15 LAB — CBC
HCT: 36 % (ref 35.0–45.0)
Hemoglobin: 12.3 g/dL (ref 11.7–15.5)
MCH: 33.5 pg — ABNORMAL HIGH (ref 27.0–33.0)
MCHC: 34.2 g/dL (ref 32.0–36.0)
MCV: 98.1 fL (ref 80.0–100.0)
MPV: 9.8 fL (ref 7.5–12.5)
Platelets: 212 10*3/uL (ref 140–400)
RBC: 3.67 10*6/uL — ABNORMAL LOW (ref 3.80–5.10)
RDW: 12.5 % (ref 11.0–15.0)
WBC: 2.8 10*3/uL — ABNORMAL LOW (ref 3.8–10.8)

## 2022-04-15 LAB — LIPID PANEL
Cholesterol: 195 mg/dL (ref <200)
HDL: 62 mg/dL (ref >=50)
LDL Cholesterol (Calc): 108 mg/dL (calc) — ABNORMAL HIGH
Non-HDL Cholesterol (Calc): 133 mg/dL (calc) — ABNORMAL HIGH (ref <130)
Total CHOL/HDL Ratio: 3.1 (calc) (ref <5.0)
Triglycerides: 134 mg/dL (ref <150)

## 2022-04-15 LAB — TSH+FREE T4: TSH W/REFLEX TO FT4: 1.58 mIU/L (ref 0.40–4.50)

## 2022-05-05 ENCOUNTER — Other Ambulatory Visit: Payer: Self-pay | Admitting: Internal Medicine

## 2022-05-05 ENCOUNTER — Encounter: Payer: Self-pay | Admitting: Internal Medicine

## 2022-05-05 MED ORDER — TRAMADOL HCL 50 MG PO TABS: 50.0000 mg | ORAL_TABLET | Freq: Every day | ORAL | 0 refills | Status: DC | PRN

## 2022-05-05 MED ORDER — VENLAFAXINE HCL ER 37.5 MG PO CP24: 37.5000 mg | ORAL_CAPSULE | Freq: Every day | ORAL | 5 refills | Status: DC

## 2022-05-06 ENCOUNTER — Ambulatory Visit: Payer: BC Managed Care – PPO | Attending: Pediatrics | Admitting: Physical Therapy

## 2022-05-06 ENCOUNTER — Encounter: Payer: Self-pay | Admitting: Physical Therapy

## 2022-05-06 DIAGNOSIS — M79672 Pain in left foot: Secondary | ICD-10-CM | POA: Diagnosis not present

## 2022-05-06 DIAGNOSIS — M5459 Other low back pain: Secondary | ICD-10-CM

## 2022-05-06 DIAGNOSIS — R2689 Other abnormalities of gait and mobility: Secondary | ICD-10-CM

## 2022-05-06 DIAGNOSIS — R278 Other lack of coordination: Secondary | ICD-10-CM | POA: Diagnosis not present

## 2022-05-06 DIAGNOSIS — M533 Sacrococcygeal disorders, not elsewhere classified: Secondary | ICD-10-CM

## 2022-05-06 NOTE — Telephone Encounter (Signed)
Unable to refill per protocol, Rx request was refilled 05/05/21 by provider. Will refuse duplicate request.  Requested Prescriptions  Pending Prescriptions Disp Refills   venlafaxine XR (EFFEXOR-XR) 37.5 MG 24 hr capsule [Pharmacy Med Name: Venlafaxine HCl ER 37.5 MG Oral Capsule Extended Release 24 Hour] 30 capsule 0    Sig: TAKE 1 CAPSULE BY MOUTH ONCE DAILY WITH BREAKFAST     Psychiatry: Antidepressants - SNRI - desvenlafaxine & venlafaxine Failed - 05/05/2022  1:33 PM      Failed - Lipid Panel in normal range within the last 12 months    Cholesterol  Date Value Ref Range Status  04/14/2022 195 <200 mg/dL Final   LDL Cholesterol (Calc)  Date Value Ref Range Status  04/14/2022 108 (H) mg/dL (calc) Final    Comment:    Reference range: <100 . Desirable range <100 mg/dL for primary prevention;   <70 mg/dL for patients with CHD or diabetic patients  with > or = 2 CHD risk factors. Marland Kitchen LDL-C is now calculated using the Martin-Hopkins  calculation, which is a validated novel method providing  better accuracy than the Friedewald equation in the  estimation of LDL-C.  Cresenciano Genre et al. Annamaria Helling. 0630;160(10): 2061-2068  (http://education.[employer].com/faq/FAQ164)    HDL  Date Value Ref Range Status  04/14/2022 62 > OR = 50 mg/dL Final   Triglycerides  Date Value Ref Range Status  04/14/2022 134 <150 mg/dL Final         Passed - Cr in normal range and within 360 days    Creat  Date Value Ref Range Status  04/14/2022 0.73 0.50 - 1.03 mg/dL Final         Passed - Completed PHQ-2 or PHQ-9 in the last 360 days      Passed - Last BP in normal range    BP Readings from Last 1 Encounters:  04/14/22 132/76         Passed - Valid encounter within last 6 months    Recent Outpatient Visits           3 weeks ago Chemotherapy-induced neutropenia [facility])   [facility], Coralie Keens, NP   1 month ago Postmenopausal vaginal bleeding   [facility] [place], Coralie Keens, NP   2 months ago Abscessed tooth   [facility] [place], Coralie Keens, NP   7 months ago Encounter for general adult medical examination with abnormal findings   [facility] [place], Coralie Keens, NP   8 months ago Community acquired pneumonia of left lower lobe of lung   [facility] [place], Coralie Keens, NP               traMADol (ULTRAM) 50 MG tablet [Pharmacy Med Name: traMADol HCl 50 MG Oral Tablet] 30 tablet 0    Sig: TAKE 1 TABLET BY MOUTH ONCE DAILY AS NEEDED     Not Delegated - Analgesics:  Opioid Agonists Failed - 05/05/2022  1:33 PM      Failed - This refill cannot be delegated      Failed - Urine Drug Screen completed in last 360 days      Passed - Valid encounter within last 3 months    Recent Outpatient Visits           3 weeks ago Chemotherapy-induced neutropenia [facility])   [facility] [place], Coralie Keens, NP   1 month ago Postmenopausal vaginal bleeding   [place]  [facility] [place], Coralie Keens, NP   2 months ago Abscessed tooth   [facility] [place], Coralie Keens, NP   7 months ago Encounter for general adult medical examination with abnormal findings   [facility] [place], Coralie Keens, NP   8 months ago Community acquired pneumonia of left lower lobe of lung   [facility] [place], Coralie Keens, [place]

## 2022-05-06 NOTE — Therapy (Unsigned)
OUTPATIENT PHYSICAL THERAPY EVALUATION   Patient Name: Brenda Grimes MRN: [id number] DOB:1965/10/13, 57 y.o., female Today's Date: 05/06/2022   PT End of Session - 05/06/22 1029     Visit Number 1    Number of Visits 10    Date for PT Re-Evaluation 07/15/22    PT Start Time 1025    PT Stop Time 1105    PT Time Calculation (min) 40 min    Activity Tolerance Patient tolerated treatment well;No increased pain    Behavior During Therapy WFL for tasks assessed/performed             Past Medical History:  Diagnosis Date   Arthritis    CAD (coronary artery disease)    s/p stenting x 2   Cancer ([place])    anus cancer   Colon polyp    Depression    Hyperlipidemia    MI (myocardial infarction) ([place])    Thyroid disease    Vitamin D deficiency    Past Surgical History:  Procedure Laterality Date   CARDIAC SURGERY     COLONOSCOPY WITH PROPOFOL N/A 04/17/2021   Procedure: COLONOSCOPY WITH BIOPSY;  Surgeon: Lin Landsman, MD;  Location: [place];  Service: Endoscopy;  Laterality: N/A;   CORONARY ANGIOPLASTY WITH STENT PLACEMENT     POLYPECTOMY N/A 04/17/2021   Procedure: POLYPECTOMY;  Surgeon: Lin Landsman, MD;  Location: [place];  Service: Endoscopy;  Laterality: N/A;   TUBAL LIGATION     Patient Active Problem List   Diagnosis Date Noted   GERD (gastroesophageal reflux disease) 04/14/2022   Class 1 obesity due to excess calories with body mass index (BMI) of 33.0 to 33.9 in adult 09/09/2021   Chronic diarrhea of unknown origin    Neutropenia ([place]) 02/04/2021   Hot flashes due to menopause 09/12/2020   Class 1 obesity due to excess calories with body mass index (BMI) of 32.0 to 32.9 in adult 06/30/2018   Dyslipidemia 06/21/2017   Depression, recurrent ([place]) 12/17/2014   Vaginal stenosis 12/19/2013   History of anal cancer 07/20/2012   Coronary artery disease involving native coronary artery of native heart with angina pectoris ([place])  08/27/2010   DDD (degenerative disc disease), lumbosacral 07/28/2010   Hypothyroidism 02/06/2010    PCP: Garnette Gunner   REFERRING PROVIDER: Chu,Christine MD  REFERRING DIAG:  N39.3 (ICD-10-CM) - Stress incontinence (female) (female) R15.9 (ICD-10-CM) - Full incontinence of feces R15.2 (ICD-10-CM) - Fecal urgency  Rationale for Evaluation and Treatment Rehabilitation  THERAPY DIAG:  Other lack of coordination  Sacrococcygeal disorders, not elsewhere classified  Other abnormalities of gait and mobility  Other low back pain  ONSET DATE:   SUBJECTIVE:  SUBJECTIVE STATEMENT:  1) SUI: leakage occurs with laughing coughing, laughing, sneezing, Wears a thin urinary pad.  Works 8-10 hours standing , lifting and pulling.    2) fecal leakage and frequency: Bowel movements occur 4x in morning, 2-3 x in the after eating, 1 x at night. Stool consistency Type 5-7 across 100% of the time. Anal Cancer , underwent radiation and chemo. 2011  3) CLBP:  occurs with lifting from floor 35 lbs across 2 x a day at work.  Pain level at worst 5/10 and she takes Tramadol and stretches it out with decreased pain to 3/10. Sometimes radiating down R posterior thigh to back of knee which had in the past sent her to emergency room 2010.  Bursitis in B hips    4) L foot has plantar fasciitis which is better but standing pa for > 8 hours , 3/10 after Tramadol.   5) pelvic pain with sexual intercourse and pelvic exam, unable to tolerate speculum / transvaginal US   PERTINENT HISTORY:  Anal Cancer , underwent radiation and chemo. 2011 ,  tubal ligation, 2 cardiac stents,   PAIN:  Are you having pain? Yes: See above   PRECAUTIONS: None  WEIGHT BEARING RESTRICTIONS: No  FALLS:  Has patient fallen in last 6 months? 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 , lifting and pulling)   PLOF: Independent  PATIENT GOALS:  To address vaginal stenosis for sexual intercourse and for pelvic exams    OBJECTIVE:    [facility] PT Assessment - 05/06/22 1100       Other:   Other/ Comments repeated turning at work L to R, L to R      AROM   Overall AROM Comments L trunk rotation caused non radiating LBP 3/10      Strength   Overall Strength Comments B LE 4/5      Palpation   Spinal mobility R shoulder, L iliac crest higher, R convex thoracic, L lumbar convex      Ambulation/Gait   Gait Comments 1.14 m/s  excessive sway to R, limited L shoulder posterior rotation               HOME EXERCISE PROGRAM: See pt instruction section    ASSESSMENT:  CLINICAL IMPRESSION:  Pt is a  57  yo  who presents with  pelvic pain with sexual intercourse and pelvic exam, SUI, fecal frequency and leakage, CLBP, and L foot pain which impact QOL, ADL, work, and community activities.   Pt's musculoskeletal assessment revealed uneven pelvic girdle and shoulder height, asymmetries to gait pattern, limited spinal /pelvic mobility, dyscoordination and strength of pelvic floor mm, hip weakness, poor body mechanics which places strain on the abdominal/pelvic floor mm. These are deficits that indicate an ineffective intraabdominal pressure system associated with increased risk for pt's Sx. Hx of anal CA with radiation and chemo Tx.   Pt was provided education on etiology of Sx with anatomy, physiology explanation with images along with the benefits of customized pelvic PT Tx based on pt's medical conditions and musculoskeletal deficits.  Explained the physiology of deep core mm coordination and roles of pelvic floor function in urination, defecation, sexual function, and postural  control with deep core mm system.    Regional interdependent approaches will yield greater benefits in pt's POC due to the  complexity of pt's medical Hx and the significant impact their Sx have had on their QOL. Pt would benefit from a biopsychosocial approach to yield optimal outcomes. Plan to build interdisciplinary team with pt's providers to optimize patient-centered care.   Plan to address pelvic girdle / spinal alignment at next session.    Pt benefits from skilled PT.    OBJECTIVE IMPAIRMENTS decreased activity tolerance, decreased coordination, decreased endurance, decreased mobility, difficulty walking, decreased ROM, decreased strength, decreased safety awareness, hypomobility, increased muscle spasms, impaired flexibility, improper body mechanics, postural dysfunction, and pain. scar restrictions   ACTIVITY LIMITATIONS  self-care, sleep, home chores, work tasks    PARTICIPATION LIMITATIONS:  community , ADLs    PERSONAL FACTORS    Anal CA Hx, standing job with physical activities  are also affecting patient's functional outcome.    REHAB POTENTIAL: Good   CLINICAL DECISION MAKING: Evolving/moderate complexity   EVALUATION COMPLEXITY: Moderate    PATIENT EDUCATION:    Education details: Showed pt anatomy images. Explained muscles attachments/ connection, physiology of deep core system/ spinal- thoracic-pelvis-lower kinetic chain as they relate to pt's presentation, Sx, and past Hx. Explained what and how these areas of deficits need to be restored to balance and function    See Therapeutic activity / neuromuscular re-education section  Answered pt's questions.   Person educated: Patient Education method: Explanation, Demonstration, Tactile cues, Verbal cues, and Handouts Education comprehension: verbalized understanding, returned demonstration, verbal cues required, tactile cues required, and needs further education     PLAN: PT FREQUENCY: 1x/week   PT DURATION: 10  weeks   PLANNED INTERVENTIONS: Therapeutic exercises, Therapeutic activity, Neuromuscular re-education, Balance training, Gait training, Patient/Family education, Self Care, Joint mobilization, Spinal mobilization, Moist heat, Taping, and Manual therapy, dry needling.   PLAN FOR NEXT SESSION: See clinical impression for plan     GOALS: Goals reviewed with patient? Yes  SHORT TERM GOALS: Target date: 06/03/2022    Pt will demo IND with HEP                    Baseline: Not IND            Goal status: INITIAL   LONG TERM GOALS: Target date: 07/15/2022    1.Pt will demo proper deep core coordination without chest breathing and optimal excursion of diaphragm/pelvic floor in order to promote spinal stability and pelvic floor function  Baseline: dyscoordination Goal status: INITIAL  2.  Pt will demo > 5 pt change on FOTO  to improve QOL and function   Pelvic Pain baseline -54 PFDI Urinary baseline -50  PFDI Bowel -71  Lumber baseline  -   Goal status: INITIAL  3.  Pt will demo proper body mechanics in against gravity tasks and ADLs  work tasks, fitness  to minimize straining pelvic floor / back                  Baseline: not IND, improper form that places strain on pelvic floor                Goal status: INITIAL   4. Decrease fecal frequency from 8 x per night to < 4 x day in order to improve QOL   Baseline:  Bowel movements occur 4x in morning, 2-3 x in the after eating, 1 x at night.  Goal status: INITIAL    5. Improve stool consistency with Type 4 across 4 x a month or more  Baseline: 2 x month  while Stool consistency Type 5-7 across most of the time.  Goal status: INITIAL   6. Pt will demo improved lengthening of pelvic floor in order to enjoy sexual intercourse and undergo pelvic exam with speculum or trans Vaginal [place]  Baseline:  Goal status: INITIAL   7. L foot pain from Plantar fasciitis will  decrease by 50% after standing for > 8 hours at work              Baseline: 3/10 with tramadol and Ibuprofen            Goal status: INITIAL             8. Pt will demo levelled pelvic girdle and shoulder height in order to progress to deep core strengthening HEP and restore mobility at spine, pelvis, gait, posture   Baseline:  R shoulder, L iliac crest higher, R convex thoracic, L lumbar convex  Goals Status: INITIAL   Jerl Mina, PT 05/06/2022, 10:33 AM

## 2022-05-12 ENCOUNTER — Ambulatory Visit: Payer: BC Managed Care – PPO | Admitting: Physical Therapy

## 2022-05-12 DIAGNOSIS — M79672 Pain in left foot: Secondary | ICD-10-CM | POA: Diagnosis not present

## 2022-05-12 DIAGNOSIS — M5459 Other low back pain: Secondary | ICD-10-CM | POA: Diagnosis not present

## 2022-05-12 DIAGNOSIS — R278 Other lack of coordination: Secondary | ICD-10-CM

## 2022-05-12 DIAGNOSIS — R2689 Other abnormalities of gait and mobility: Secondary | ICD-10-CM | POA: Diagnosis not present

## 2022-05-12 DIAGNOSIS — M533 Sacrococcygeal disorders, not elsewhere classified: Secondary | ICD-10-CM | POA: Diagnosis not present

## 2022-05-12 NOTE — Therapy (Signed)
OUTPATIENT PHYSICAL THERAPY TREATMENT   Patient Name: Brenda Grimes MRN: [id number] DOB:Feb 21, 1966, 57 y.o., female Today's Date: 05/12/2022   PT End of Session - 05/12/22 1519     Visit Number 2    Number of Visits 10    Date for PT Re-Evaluation 07/15/22    PT Start Time 8588    PT Stop Time 5027    PT Time Calculation (min) 40 min    Activity Tolerance Patient tolerated treatment well;No increased pain    Behavior During Therapy WFL for tasks assessed/performed             Past Medical History:  Diagnosis Date   anal cancer    anal cancer , underwent radiation and chemo   Arthritis    CAD (coronary artery disease)    s/p stenting x 2   Colon polyp    Depression    Hyperlipidemia    MI (myocardial infarction) ([place])    Thyroid disease    Vitamin D deficiency    Past Surgical History:  Procedure Laterality Date   CARDIAC SURGERY  2010   2 stents   COLONOSCOPY WITH PROPOFOL N/A 04/17/2021   Procedure: COLONOSCOPY WITH BIOPSY;  Surgeon: Lin Landsman, MD;  Location: [facility];  Service: Endoscopy;  Laterality: N/A;   CORONARY ANGIOPLASTY WITH STENT PLACEMENT     POLYPECTOMY N/A 04/17/2021   Procedure: POLYPECTOMY;  Surgeon: Lin Landsman, MD;  Location: [place];  Service: Endoscopy;  Laterality: N/A;   TUBAL LIGATION     Patient Active Problem List   Diagnosis Date Noted   GERD (gastroesophageal reflux disease) 04/14/2022   Class 1 obesity due to excess calories with body mass index (BMI) of 33.0 to 33.9 in adult 09/09/2021   Chronic diarrhea of unknown origin    Neutropenia ([place]) 02/04/2021   Hot flashes due to menopause 09/12/2020   Class 1 obesity due to excess calories with body mass index (BMI) of 32.0 to 32.9 in adult 06/30/2018   Dyslipidemia 06/21/2017   Depression, recurrent ([place]) 12/17/2014   Vaginal stenosis 12/19/2013   History of anal cancer 07/20/2012   Coronary artery disease involving native coronary artery of  native heart with angina pectoris ([place]) 08/27/2010   DDD (degenerative disc disease), lumbosacral 07/28/2010   Hypothyroidism 02/06/2010    PCP: Garnette Gunner   REFERRING PROVIDER: Chu,Christine MD  REFERRING DIAG:  N39.3 (ICD-10-CM) - Stress incontinence (female) (female) R15.9 (ICD-10-CM) - Full incontinence of feces R15.2 (ICD-10-CM) - Fecal urgency  Rationale for Evaluation and Treatment Rehabilitation  THERAPY DIAG:  Other lack of coordination  Sacrococcygeal disorders, not elsewhere classified  Other abnormalities of gait and mobility  Other low back pain  Pain in left foot  ONSET DATE:   SUBJECTIVE:  SUBJECTIVE STATEMENT TODAY:    Pt reported she only had one day of LBP last week compared to 2-3 days. Pt is more aware of how she bends and pick things up. Pt is feeling pain at the below belly button by the area where she received radiation.    SUBJECTIVE STATEMENT on EVAL 05/06/22 :  1) SUI: leakage occurs with laughing coughing, laughing, sneezing, Wears a thin urinary pad.  Works 8-10 hours standing , lifting and pulling.    2) fecal leakage and frequency: Bowel movements occur 4x in morning, 2-3 x in the after eating, 1 x at night. Stool consistency Type 5-7 across 100% of the time. Anal Cancer , underwent radiation and chemo. 2011  3) CLBP:  occurs with lifting from floor 35 lbs across 2 x a day at work.  Pain level at worst 5/10 and she takes Tramadol and stretches it out with decreased pain to 3/10. Sometimes radiating down R posterior thigh to back of knee which had in the past sent her to emergency room 2010.  Bursitis in B hips    4) L foot has plantar fasciitis which is better but standing pa for > 8 hours , 3/10 after Tramadol.   5) pelvic pain with sexual intercourse and pelvic exam,  unable to tolerate speculum / transvaginal US   PERTINENT HISTORY:  Anal Cancer , underwent radiation and chemo. 2011 ,  tubal ligation, 2 cardiac stents,   PAIN:  Are you having pain? Yes: See above   PRECAUTIONS: None  WEIGHT BEARING RESTRICTIONS: No  FALLS:  Has patient fallen in last 6 months? 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 , lifting and pulling)   PLOF: Independent  PATIENT GOALS:  To address vaginal stenosis for sexual intercourse and for pelvic exams    OBJECTIVE:    [facility] PT Assessment - 05/12/22 1522       Palpation   Spinal mobility levelled pelvic girdle    Palpation comment hypomobile L ankle/ midfoot, high arch, fascial restrictions at suprapubic area ( tenderness on R >L)      Ambulation/Gait   Gait Comments 1.14.m/s reciprocal gait             [facility] Adult PT Treatment/Exercise - 05/12/22 1522       Neuro Re-ed    Neuro Re-ed Details  cued for heel stretch to lower plantar arch/ promote DF/DV , ab massage , thoracic mobility, gait mechanics to promote planatar mobility      Modalities   Modalities Moist Heat      Moist Heat Therapy   Number Minutes Moist Heat 10 Minutes    Moist Heat Location --   on ab , during manual Tx to promote DF/EV on L     Manual Therapy   Manual therapy comments fascial mobility over suprapubic area, distraction of L ankle, STM/MWM/ AP/PA mob Grade II-III at midfoot joints L to promote DF/EV               HOME EXERCISE PROGRAM: See pt instruction section    ASSESSMENT:  CLINICAL IMPRESSION:                PT showed restored aligned pelvic girdle / spinal alignment today. Focused on abdominal fascial restrictions to address suprapubic area where she received radiation for anal CA. Pt  reported decreased pain post Tx, discomfort  remained. Hip abduction over foam roller caused pubic pain and position was adjusted to minimize hip abduction which pt tolerated. Plan to assess and promote hip abduction at next session. Focused on L foot pain with manual Tx to minimize high plantar arches / promote DF/EV.  Cued for gait mechanics to promote DF/EV , toe ext to increase mobility of plantar fascia.  Plan to continue to work on lower kinetic chain and SIJ at next session to address pubic pain and radiated areas.    Pt benefits from skilled PT.    OBJECTIVE IMPAIRMENTS decreased activity tolerance, decreased coordination, decreased endurance, decreased mobility, difficulty walking, decreased ROM, decreased strength, decreased safety awareness, hypomobility, increased muscle spasms, impaired flexibility, improper body mechanics, postural dysfunction, and pain. scar restrictions   ACTIVITY LIMITATIONS  self-care, sleep, home chores, work tasks    PARTICIPATION LIMITATIONS:  community , ADLs    PERSONAL FACTORS    Anal CA Hx, standing job with physical activities  are also affecting patient's functional outcome.    REHAB POTENTIAL: Good   CLINICAL DECISION MAKING: Evolving/moderate complexity   EVALUATION COMPLEXITY: Moderate    PATIENT EDUCATION:    Education details: Showed pt anatomy images. Explained muscles attachments/ connection, physiology of deep core system/ spinal- thoracic-pelvis-lower kinetic chain as they relate to pt's presentation, Sx, and past Hx. Explained what and how these areas of deficits need to be restored to balance and function    See Therapeutic activity / neuromuscular re-education section  Answered pt's questions.   Person educated: Patient Education method: Explanation, Demonstration, Tactile cues, Verbal cues, and Handouts Education comprehension: verbalized understanding, returned demonstration, verbal cues required, tactile cues required, and needs further education     PLAN: PT  FREQUENCY: 1x/week   PT DURATION: 10 weeks   PLANNED INTERVENTIONS: Therapeutic exercises, Therapeutic activity, Neuromuscular re-education, Balance training, Gait training, Patient/Family education, Self Care, Joint mobilization, Spinal mobilization, Moist heat, Taping, and Manual therapy, dry needling.   PLAN FOR NEXT SESSION: See clinical impression for plan     GOALS: Goals reviewed with patient? Yes  SHORT TERM GOALS: Target date: 06/03/2022    Pt will demo IND with HEP                    Baseline: Not IND            Goal status: INITIAL   LONG TERM GOALS: Target date: 07/15/2022    1.Pt will demo proper deep core coordination without chest breathing and optimal excursion of diaphragm/pelvic floor in order to promote spinal stability and pelvic floor function  Baseline: dyscoordination Goal status: INITIAL  2.  Pt will demo > 5 pt change on FOTO  to improve QOL and function   Pelvic Pain baseline -54 PFDI Urinary baseline -50  PFDI Bowel -71  Lumber baseline  -   Goal status: INITIAL  3.  Pt will demo proper body mechanics in against gravity tasks and ADLs  work tasks, fitness  to minimize straining pelvic floor / back                  Baseline: not IND, improper form that places strain on pelvic floor                Goal status: INITIAL   4. Decrease fecal frequency from 8 x per night to < 4 x day in order to improve QOL   Baseline:  Bowel movements  occur 4x in morning, 2-3 x in the after eating, 1 x at night.  Goal status: INITIAL    5. Improve stool consistency with Type 4 across 4 x a month or more  Baseline: 2 x month while Stool consistency Type 5-7 across most of the time.  Goal status: INITIAL   6. Pt will demo improved lengthening of pelvic floor in order to enjoy sexual intercourse and undergo pelvic exam with speculum or trans Vaginal [place]  Baseline:  Goal status: INITIAL   7. L foot pain from Plantar fasciitis will  decrease by 50% after  standing for > 8 hours at work             Baseline: 3/10 with tramadol and Ibuprofen            Goal status: INITIAL             8. Pt will demo levelled pelvic girdle and shoulder height in order to progress to deep core strengthening HEP and restore mobility at spine, pelvis, gait, posture   Baseline:  R shoulder, L iliac crest higher, R convex thoracic, L lumbar convex  Goals Status: INITIAL   Jerl Mina, PT 05/12/2022, 4:11 PM

## 2022-05-12 NOTE — Patient Instructions (Addendum)
   Brushing arm with 3/4 turn onto pillow behind back  Lying on R  side ,Pillow/ Block between knees     dragging top forearm across ribs below breast rotating 3/4 turn,  rotating  _L_ only this week ,  relax onto the pillow behind the back  and then back to other palm , maintain top palm on body whole top and not lift shoulder  ___    Brushing arm with 3/4 turn onto pillow behind back  Lying on L  side ,Pillow/ Block between knees     dragging top forearm across ribs below breast rotating 3/4 turn,  rotating  _R_ only this week ,  relax onto the pillow behind the back  and then back to other palm , maintain top palm on body whole top and not lift shoulder  __   Abdominal massage upward from L,  R , center to belly button 3 stroke x 3 , pressure is gentle and light with all fingers flat, not using finger tips  __   Side of hip stretch:  both sides   Reclined twist for hips and side of the hips/ legs  Lay on your back, knees bend Scoot hips to the R , leave shoulders in place Wobble knees to the L side 45 deg and to midline  10 reps   __  Walking with higher knees, land on ballmounds big toe and less on outer foot

## 2022-05-19 ENCOUNTER — Ambulatory Visit: Payer: BC Managed Care – PPO | Admitting: Physical Therapy

## 2022-05-19 DIAGNOSIS — M5459 Other low back pain: Secondary | ICD-10-CM

## 2022-05-19 DIAGNOSIS — M79672 Pain in left foot: Secondary | ICD-10-CM | POA: Diagnosis not present

## 2022-05-19 DIAGNOSIS — M533 Sacrococcygeal disorders, not elsewhere classified: Secondary | ICD-10-CM

## 2022-05-19 DIAGNOSIS — R278 Other lack of coordination: Secondary | ICD-10-CM

## 2022-05-19 DIAGNOSIS — R2689 Other abnormalities of gait and mobility: Secondary | ICD-10-CM

## 2022-05-19 NOTE — Therapy (Addendum)
OUTPATIENT PHYSICAL THERAPY TREATMENT   Patient Name: Brenda Grimes MRN: [id number] DOB:1965-11-20, 57 y.o., female Today's Date: 05/19/2022   PT End of Session - 05/19/22 1558     Visit Number 3    Number of Visits 10    Date for PT Re-Evaluation 07/15/22    PT Start Time 5379    PT Stop Time 1630    PT Time Calculation (min) 42 min    Activity Tolerance Patient tolerated treatment well;No increased pain    Behavior During Therapy WFL for tasks assessed/performed             Past Medical History:  Diagnosis Date   anal cancer    anal cancer , underwent radiation and chemo   Arthritis    CAD (coronary artery disease)    s/p stenting x 2   Colon polyp    Depression    Hyperlipidemia    MI (myocardial infarction) ([place])    Thyroid disease    Vitamin D deficiency    Past Surgical History:  Procedure Laterality Date   CARDIAC SURGERY  2010   2 stents   COLONOSCOPY WITH PROPOFOL N/A 04/17/2021   Procedure: COLONOSCOPY WITH BIOPSY;  Surgeon: Lin Landsman, MD;  Location: [place];  Service: Endoscopy;  Laterality: N/A;   CORONARY ANGIOPLASTY WITH STENT PLACEMENT     POLYPECTOMY N/A 04/17/2021   Procedure: POLYPECTOMY;  Surgeon: Lin Landsman, MD;  Location: [place];  Service: Endoscopy;  Laterality: N/A;   TUBAL LIGATION     Patient Active Problem List   Diagnosis Date Noted   GERD (gastroesophageal reflux disease) 04/14/2022   Class 1 obesity due to excess calories with body mass index (BMI) of 33.0 to 33.9 in adult 09/09/2021   Chronic diarrhea of unknown origin    Neutropenia ([place]) 02/04/2021   Hot flashes due to menopause 09/12/2020   Class 1 obesity due to excess calories with body mass index (BMI) of 32.0 to 32.9 in adult 06/30/2018   Dyslipidemia 06/21/2017   Depression, recurrent ([place]) 12/17/2014   Vaginal stenosis 12/19/2013   History of anal cancer 07/20/2012   Coronary artery disease involving native coronary artery of  native heart with angina pectoris ([place]) 08/27/2010   DDD (degenerative disc disease), lumbosacral 07/28/2010   Hypothyroidism 02/06/2010    PCP: Garnette Gunner   REFERRING PROVIDER: Chu,Christine MD  REFERRING DIAG:  N39.3 (ICD-10-CM) - Stress incontinence (female) (female) R15.9 (ICD-10-CM) - Full incontinence of feces R15.2 (ICD-10-CM) - Fecal urgency  Rationale for Evaluation and Treatment Rehabilitation  THERAPY DIAG:  Other lack of coordination  Sacrococcygeal disorders, not elsewhere classified  Other abnormalities of gait and mobility  Other low back pain  Pain in left foot  ONSET DATE:   SUBJECTIVE:  SUBJECTIVE STATEMENT TODAY:    Pt reported L foot pain resolved. There was soreness at the abdominal scars and she get it is getting better. Pt reports the deep core exercises are causing soreness in a good way. The LBP still hurts , no change  Pt noticed she was able to perform her typical work duties with baking , even with pain, that she was able to finish the tasks earlier than typical time it takes to complete them.     SUBJECTIVE STATEMENT on EVAL 05/06/22 :  1) SUI: leakage occurs with laughing coughing, laughing, sneezing, Wears a thin urinary pad.  Works 8-10 hours standing , lifting and pulling.    2) fecal leakage and frequency: Bowel movements occur 4x in morning, 2-3 x in the after eating, 1 x at night. Stool consistency Type 5-7 across 100% of the time. Anal Cancer , underwent radiation and chemo. 2011  3) CLBP:  occurs with lifting from floor 35 lbs across 2 x a day at work.  Pain level at worst 5/10 and she takes Tramadol and stretches it out with decreased pain to 3/10. Sometimes radiating down R posterior thigh to back of knee which had in the past sent her to emergency room 2010.   Bursitis in B hips    4) L foot has plantar fasciitis which is better but standing pa for > 8 hours , 3/10 after Tramadol.   5) pelvic pain with sexual intercourse and pelvic exam, unable to tolerate speculum / transvaginal US   PERTINENT HISTORY:  Anal Cancer , underwent radiation and chemo. 2011 ,  tubal ligation, 2 cardiac stents,   PAIN:  Are you having pain? Yes: See above   PRECAUTIONS: None  WEIGHT BEARING RESTRICTIONS: No  FALLS:  Has patient fallen in last 6 months? 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 , lifting and pulling)   PLOF: Independent  PATIENT GOALS:  To address vaginal stenosis for sexual intercourse and for pelvic exams    OBJECTIVE:    [facility] PT Assessment - 05/19/22 1559       AROM   Overall AROM Comments trunk rotation B within [facility] no pain      Palpation   Spinal mobility levelled pelvic girdle, L shoulder lowered             [facility] Adult PT Treatment/Exercise -      Modalities   Modalities Moist Heat      Moist Heat Therapy   Number Minutes Moist Heat 5 Minutes    Moist Heat Location --   thoracic  ( unbilled)     Manual Therapy   Manual therapy comments STM/MWM at problem areas noted in assessment to address lowered shoulder L , promote diaphragm excursion on L                HOME EXERCISE PROGRAM: See pt instruction section    ASSESSMENT:  CLINICAL IMPRESSION:                Pt noticed she was able to perform her typical work duties with baking , even with pain, that she was able to finish the tasks earlier than typical time it takes to complete them. Pt also reports foot pain has resolved.  Focused on L thoracic mm tightness/ deviations. Plan to add deep core and more scapulothoracic strengthening at next sessions. Explained the  importance of improving posture before addressing pelvic floor issues    Pt benefits from skilled PT.    OBJECTIVE IMPAIRMENTS decreased activity tolerance, decreased coordination, decreased endurance, decreased mobility, difficulty walking, decreased ROM, decreased strength, decreased safety awareness, hypomobility, increased muscle spasms, impaired flexibility, improper body mechanics, postural dysfunction, and pain. scar restrictions   ACTIVITY LIMITATIONS  self-care, sleep, home chores, work tasks    PARTICIPATION LIMITATIONS:  community , ADLs    PERSONAL FACTORS    Anal CA Hx, standing job with physical activities  are also affecting patient's functional outcome.    REHAB POTENTIAL: Good   CLINICAL DECISION MAKING: Evolving/moderate complexity   EVALUATION COMPLEXITY: Moderate    PATIENT EDUCATION:    Education details: Showed pt anatomy images. Explained muscles attachments/ connection, physiology of deep core system/ spinal- thoracic-pelvis-lower kinetic chain as they relate to pt's presentation, Sx, and past Hx. Explained what and how these areas of deficits need to be restored to balance and function    See Therapeutic activity / neuromuscular re-education section  Answered pt's questions.   Person educated: Patient Education method: Explanation, Demonstration, Tactile cues, Verbal cues, and Handouts Education comprehension: verbalized understanding, returned demonstration, verbal cues required, tactile cues required, and needs further education     PLAN: PT FREQUENCY: 1x/week   PT DURATION: 10 weeks   PLANNED INTERVENTIONS: Therapeutic exercises, Therapeutic activity, Neuromuscular re-education, Balance training, Gait training, Patient/Family education, Self Care, Joint mobilization, Spinal mobilization, Moist heat, Taping, and Manual therapy, dry needling.   PLAN FOR NEXT SESSION: See clinical impression for plan     GOALS: Goals reviewed with patient?  Yes  SHORT TERM GOALS: Target date: 06/03/2022    Pt will demo IND with HEP                    Baseline: Not IND            Goal status: INITIAL   LONG TERM GOALS: Target date: 07/15/2022    1.Pt will demo proper deep core coordination without chest breathing and optimal excursion of diaphragm/pelvic floor in order to promote spinal stability and pelvic floor function  Baseline: dyscoordination Goal status: INITIAL  2.  Pt will demo > 5 pt change on FOTO  to improve QOL and function   Pelvic Pain baseline -54 PFDI Urinary baseline -50  PFDI Bowel -71  Lumber baseline  -   Goal status: INITIAL  3.  Pt will demo proper body mechanics in against gravity tasks and ADLs  work tasks, fitness  to minimize straining pelvic floor / back                  Baseline: not IND, improper form that places strain on pelvic floor                Goal status: INITIAL   4. Decrease fecal frequency from 8 x per night to < 4 x day in order to improve QOL   Baseline:  Bowel movements occur 4x in morning, 2-3 x in the after eating, 1 x at night.  Goal status: INITIAL    5. Improve stool consistency with Type 4 across 4 x a month or more  Baseline: 2 x month while Stool consistency Type 5-7 across most of the time.  Goal status: INITIAL   6. Pt will demo improved  lengthening of pelvic floor in order to enjoy sexual intercourse and undergo pelvic exam with speculum or trans Vaginal [place]  Baseline:  Goal status: INITIAL   7. L foot pain from Plantar fasciitis will  decrease by 50% after standing for > 8 hours at work             Baseline: 3/10 with tramadol and Ibuprofen            Goal status: INITIAL             8. Pt will demo levelled pelvic girdle and shoulder height in order to progress to deep core strengthening HEP and restore mobility at spine, pelvis, gait, posture   Baseline:  R shoulder, L iliac crest higher, R convex thoracic, L lumbar convex  Goals Status: INITIAL   Jerl Mina, PT 05/19/2022, 4:03 PM

## 2022-05-20 NOTE — Patient Instructions (Signed)
Continue with winging and brushing on both sides

## 2022-05-26 ENCOUNTER — Ambulatory Visit: Payer: BC Managed Care – PPO | Admitting: Physical Therapy

## 2022-06-02 ENCOUNTER — Ambulatory Visit: Payer: BC Managed Care – PPO | Admitting: Physical Therapy

## 2022-06-02 DIAGNOSIS — R278 Other lack of coordination: Secondary | ICD-10-CM | POA: Diagnosis not present

## 2022-06-02 DIAGNOSIS — M5459 Other low back pain: Secondary | ICD-10-CM

## 2022-06-02 DIAGNOSIS — M79672 Pain in left foot: Secondary | ICD-10-CM

## 2022-06-02 DIAGNOSIS — M533 Sacrococcygeal disorders, not elsewhere classified: Secondary | ICD-10-CM

## 2022-06-02 DIAGNOSIS — R2689 Other abnormalities of gait and mobility: Secondary | ICD-10-CM

## 2022-06-02 NOTE — Therapy (Signed)
OUTPATIENT PHYSICAL THERAPY TREATMENT   Patient Name: Brenda Grimes MRN: [id number] DOB:February 05, 1966, 57 y.o., female Today's Date: 06/02/2022   PT End of Session - 06/02/22 1552     Visit Number 4    Number of Visits 10    Date for PT Re-Evaluation 07/15/22    PT Start Time 1761    PT Stop Time 1630    PT Time Calculation (min) 40 min    Activity Tolerance Patient tolerated treatment well;No increased pain    Behavior During Therapy WFL for tasks assessed/performed             Past Medical History:  Diagnosis Date   anal cancer    anal cancer , underwent radiation and chemo   Arthritis    CAD (coronary artery disease)    s/p stenting x 2   Colon polyp    Depression    Hyperlipidemia    MI (myocardial infarction) ([place])    Thyroid disease    Vitamin D deficiency    Past Surgical History:  Procedure Laterality Date   CARDIAC SURGERY  2010   2 stents   COLONOSCOPY WITH PROPOFOL N/A 04/17/2021   Procedure: COLONOSCOPY WITH BIOPSY;  Surgeon: Lin Landsman, MD;  Location: [facility];  Service: Endoscopy;  Laterality: N/A;   CORONARY ANGIOPLASTY WITH STENT PLACEMENT     POLYPECTOMY N/A 04/17/2021   Procedure: POLYPECTOMY;  Surgeon: Lin Landsman, MD;  Location: [facility];  Service: Endoscopy;  Laterality: N/A;   TUBAL LIGATION     Patient Active Problem List   Diagnosis Date Noted   GERD (gastroesophageal reflux disease) 04/14/2022   Class 1 obesity due to excess calories with body mass index (BMI) of 33.0 to 33.9 in adult 09/09/2021   Chronic diarrhea of unknown origin    Neutropenia ([place]) 02/04/2021   Hot flashes due to menopause 09/12/2020   Class 1 obesity due to excess calories with body mass index (BMI) of 32.0 to 32.9 in adult 06/30/2018   Dyslipidemia 06/21/2017   Depression, recurrent ([place]) 12/17/2014   Vaginal stenosis 12/19/2013   History of anal cancer 07/20/2012   Coronary artery disease involving native coronary artery of  native heart with angina pectoris ([place]) 08/27/2010   DDD (degenerative disc disease), lumbosacral 07/28/2010   Hypothyroidism 02/06/2010    PCP: Garnette Gunner   REFERRING PROVIDER: Chu,Christine MD  REFERRING DIAG:  N39.3 (ICD-10-CM) - Stress incontinence (female) (female) R15.9 (ICD-10-CM) - Full incontinence of feces R15.2 (ICD-10-CM) - Fecal urgency  Rationale for Evaluation and Treatment Rehabilitation  THERAPY DIAG:  No diagnosis found.  ONSET DATE:   SUBJECTIVE:  SUBJECTIVE STATEMENT TODAY:    Pt reported L foot pain resolved. Low back pain did not occur all of last week.     SUBJECTIVE STATEMENT on EVAL 05/06/22 :  1) SUI: leakage occurs with laughing coughing, laughing, sneezing, Wears a thin urinary pad.  Works 8-10 hours standing , lifting and pulling.    2) fecal leakage and frequency: Bowel movements occur 4x in morning, 2-3 x in the after eating, 1 x at night. Stool consistency Type 5-7 across 100% of the time. Anal Cancer , underwent radiation and chemo. 2011  3) CLBP:  occurs with lifting from floor 35 lbs across 2 x a day at work.  Pain level at worst 5/10 and she takes Tramadol and stretches it out with decreased pain to 3/10. Sometimes radiating down R posterior thigh to back of knee which had in the past sent her to emergency room 2010.  Bursitis in B hips    4) L foot has plantar fasciitis which is better but standing pa for > 8 hours , 3/10 after Tramadol.   5) pelvic pain with sexual intercourse and pelvic exam, unable to tolerate speculum / transvaginal US   PERTINENT HISTORY:  Anal Cancer , underwent radiation and chemo. 2011 ,  tubal ligation, 2 cardiac stents,   PAIN:  Are you having pain? Yes: See above   PRECAUTIONS: None  WEIGHT BEARING RESTRICTIONS: No  FALLS:  Has  patient fallen in last 6 months? 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 , lifting and pulling)   PLOF: Independent  PATIENT GOALS:  To address vaginal stenosis for sexual intercourse and for pelvic exams    OBJECTIVE:    [facility] PT Assessment - 06/02/22 1555       Coordination   Coordination and Movement Description deep core training - ab overuse      Palpation   Spinal mobility levelled pelvic girdle, L shoulder lowered    Palpation comment tightness along R medial/ cervical mm, intercostals             [facility] Adult PT Treatment/Exercise - 06/02/22 1622       Neuro Re-ed    Neuro Re-ed Details  cued for deep core , less ab overuse, excessive cues for cervical/ scapular retraction/ depression with resistance band      Modalities   Modalities Moist Heat      Moist Heat Therapy   Number Minutes Moist Heat 10 Minutes    Moist Heat Location --   thoracic /, during instruction of deep core     Manual Therapy   Manual therapy comments STM/MWM at problem areas noted in assessment to address R shoulder girdle , promote diaphragm excursion on R                 HOME EXERCISE PROGRAM: See pt instruction section    ASSESSMENT:  CLINICAL IMPRESSION:                Pt is making progress with report of no LBP last week. Pt required manual Tx to minimize R shoulder tightness which optimize diaphragmatic excursion for deep core system training. Pt required cues for less ab overuse. Also required excessive cues for more cervical / scapular retraction with resistance band strengthening. Plan to add multidifis strenghtening next session and review today's HEP for correct  technique. Plan to address pelvic floor at upcoming sessions as pt's structural alignment has improved. Pt benefits from skilled PT.     OBJECTIVE IMPAIRMENTS decreased activity tolerance, decreased coordination, decreased endurance, decreased mobility, difficulty walking, decreased ROM, decreased strength, decreased safety awareness, hypomobility, increased muscle spasms, impaired flexibility, improper body mechanics, postural dysfunction, and pain. scar restrictions   ACTIVITY LIMITATIONS  self-care, sleep, home chores, work tasks    PARTICIPATION LIMITATIONS:  community , ADLs    PERSONAL FACTORS    Anal CA Hx, standing job with physical activities  are also affecting patient's functional outcome.    REHAB POTENTIAL: Good   CLINICAL DECISION MAKING: Evolving/moderate complexity   EVALUATION COMPLEXITY: Moderate    PATIENT EDUCATION:    Education details: Showed pt anatomy images. Explained muscles attachments/ connection, physiology of deep core system/ spinal- thoracic-pelvis-lower kinetic chain as they relate to pt's presentation, Sx, and past Hx. Explained what and how these areas of deficits need to be restored to balance and function    See Therapeutic activity / neuromuscular re-education section  Answered pt's questions.   Person educated: Patient Education method: Explanation, Demonstration, Tactile cues, Verbal cues, and Handouts Education comprehension: verbalized understanding, returned demonstration, verbal cues required, tactile cues required, and needs further education     PLAN: PT FREQUENCY: 1x/week   PT DURATION: 10 weeks   PLANNED INTERVENTIONS: Therapeutic exercises, Therapeutic activity, Neuromuscular re-education, Balance training, Gait training, Patient/Family education, Self Care, Joint mobilization, Spinal mobilization, Moist heat, Taping, and Manual therapy, dry needling.   PLAN FOR NEXT SESSION: See clinical impression for plan     GOALS: Goals reviewed with patient? Yes  SHORT TERM GOALS: Target date: 06/03/2022    Pt will demo IND with HEP                    Baseline:  Not IND            Goal status: INITIAL   LONG TERM GOALS: Target date: 07/15/2022    1.Pt will demo proper deep core coordination without chest breathing and optimal excursion of diaphragm/pelvic floor in order to promote spinal stability and pelvic floor function  Baseline: dyscoordination Goal status: INITIAL  2.  Pt will demo > 5 pt change on FOTO  to improve QOL and function   Pelvic Pain baseline -54 PFDI Urinary baseline -50  PFDI Bowel -71  Lumber baseline  -   Goal status: INITIAL  3.  Pt will demo proper body mechanics in against gravity tasks and ADLs  work tasks, fitness  to minimize straining pelvic floor / back                  Baseline: not IND, improper form that places strain on pelvic floor                Goal status: INITIAL   4. Decrease fecal frequency from 8 x per night to < 4 x day in order to improve QOL   Baseline:  Bowel movements occur 4x in morning, 2-3 x in the after eating, 1 x at night.  Goal status: INITIAL    5. Improve stool consistency with Type 4 across 4 x a month or more  Baseline: 2 x month while Stool consistency Type 5-7 across most of the time.  Goal status: INITIAL   6. Pt will demo improved lengthening of pelvic floor in order to enjoy sexual intercourse and undergo pelvic exam with speculum or trans  Vaginal [place]  Baseline:  Goal status: INITIAL   7. L foot pain from Plantar fasciitis will  decrease by 50% after standing for > 8 hours at work             Baseline: 3/10 with tramadol and Ibuprofen            Goal status: INITIAL             8. Pt will demo levelled pelvic girdle and shoulder height in order to progress to deep core strengthening HEP and restore mobility at spine, pelvis, gait, posture   Baseline:  R shoulder, L iliac crest higher, R convex thoracic, L lumbar convex  Goals Status: INITIAL   Jerl Mina, PT 06/02/2022, 3:53 PM

## 2022-06-02 NOTE — Patient Instructions (Signed)
Deep core level 1-2 ( handout)    ____  Place band in "U"    band under ballmounds  while laying on back w/ knees bent   Lying on back, knees bent    band under ballmounds  while laying on back w/ knees bent  "W" exercise  10 reps x 2 sets   Band is placed under feet, knees bent, feet are hip width apart Hold band with thumbs point out, keep upper arm and elbow touching the bed the whole time  - inhale and then exhale pull bands by bending elbows hands move in a "w"  (feel shoulder blades squeezing)   ________________

## 2022-06-03 ENCOUNTER — Other Ambulatory Visit: Payer: Self-pay | Admitting: Internal Medicine

## 2022-06-04 MED ORDER — TRAMADOL HCL 50 MG PO TABS: 50.0000 mg | ORAL_TABLET | Freq: Every day | ORAL | 0 refills | Status: DC | PRN

## 2022-06-09 ENCOUNTER — Ambulatory Visit: Payer: BC Managed Care – PPO | Admitting: Physical Therapy

## 2022-06-15 ENCOUNTER — Other Ambulatory Visit: Payer: Self-pay | Admitting: Internal Medicine

## 2022-06-16 ENCOUNTER — Encounter: Payer: Self-pay | Admitting: Internal Medicine

## 2022-06-17 ENCOUNTER — Encounter: Payer: Self-pay | Admitting: Obstetrics and Gynecology

## 2022-06-17 ENCOUNTER — Ambulatory Visit (INDEPENDENT_AMBULATORY_CARE_PROVIDER_SITE_OTHER): Payer: BC Managed Care – PPO | Admitting: Obstetrics and Gynecology

## 2022-06-17 VITALS — BP 110/75 | HR 79 | Ht 63.0 in | Wt 182.0 lb

## 2022-06-17 DIAGNOSIS — R102 Pelvic and perineal pain: Secondary | ICD-10-CM | POA: Diagnosis not present

## 2022-06-17 DIAGNOSIS — D219 Benign neoplasm of connective and other soft tissue, unspecified: Secondary | ICD-10-CM

## 2022-06-17 NOTE — Progress Notes (Signed)
Patient presents today to discuss findings in recent ultrasound and MRI. No additional concerns.

## 2022-06-17 NOTE — Progress Notes (Signed)
HPI:      [name] is a 57 y.o. [phone] who LMP was No LMP recorded. Patient is postmenopausal.  Subjective:   She presents today to follow-up after having an MRI at [facility].  She has a history of anal cancer and significant pelvic radiation.  This has caused vaginal scarring and she says her vagina is completely scarred closed.  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  She reports that she previously had normal Pap smears prior to the radiation.  She also had an ultrasound that showed a calcified mass in the lower uterine segment area.  MRI shows this to be a small pedunculated calcified fibroid.  Patient has not had any further vaginal bleeding since her initial workup.  Endometrium by ultrasound and by MRI is not well-visualized but is reported as not thickened.  Patient has some pelvic pain but she has had this for many years and says this has not changed.  She plans on attending pelvic floor therapy to see if she can do something to open her vagina or possibly use dilators for this purpose.  She would like to have intercourse and she would like to have Pap smears in the future if possible.    Hx: The following portions of the patient's history were reviewed and updated as appropriate:             She  has a past medical history of anal cancer, Arthritis, CAD (coronary artery disease), Colon polyp, Depression, Hyperlipidemia, MI (myocardial infarction) ([place]), Thyroid disease, and Vitamin D deficiency. She does not have any pertinent problems on file. She  has a past surgical history that includes Tubal ligation; Cardiac surgery (2010); Coronary angioplasty with stent; Colonoscopy with propofol (N/A, 04/17/2021); and polypectomy (N/A, 04/17/2021). Her family history includes Alcohol abuse in her brother; Alzheimer's disease in her maternal grandmother and mother; Breast cancer in her cousin;  CAD in her father; Cancer in her paternal aunt; Cirrhosis in her brother; Healthy in her daughter, son, and son; Heart disease in her paternal grandfather, paternal grandmother, and paternal uncle; Hyperlipidemia in her brother and brother; Thyroid disease in her brother and brother. She  reports that she quit smoking about 15 years ago. Her smoking use included cigarettes. She has a 22.50 pack-year smoking history. She has never used smokeless tobacco. She reports that she does not drink alcohol and does not use drugs. She has a current medication list which includes the following prescription(s): acetaminophen, aspirin ec, bupropion, cyclobenzaprine, gabapentin, ibuprofen, levothyroxine, loperamide, nitroglycerin, omeprazole, ondansetron, tramadol, and venlafaxine xr. She is allergic to sulfa antibiotics, sulfasalazine, and tape.       Review of Systems:  Review of Systems  Constitutional: Denied constitutional symptoms, night sweats, recent illness, fatigue, fever, insomnia and weight loss.  Eyes: Denied eye symptoms, eye pain, photophobia, vision change and visual disturbance.  Ears/Nose/Throat/Neck: Denied ear, nose, throat or neck symptoms, hearing loss, nasal discharge, sinus congestion and sore throat.  Cardiovascular: Denied cardiovascular symptoms, arrhythmia, chest pain/pressure, edema, exercise intolerance, orthopnea and palpitations.  Respiratory: Denied pulmonary symptoms, asthma, pleuritic pain, productive sputum, cough, dyspnea and wheezing.  Gastrointestinal: Denied, gastro-esophageal reflux, melena, nausea and vomiting.  Genitourinary: See HPI for additional information.  Musculoskeletal: Denied musculoskeletal symptoms, stiffness, swelling, muscle weakness and myalgia.  Dermatologic: Denied dermatology symptoms, rash and scar.  Neurologic: Denied neurology symptoms, dizziness, headache, neck pain  and syncope.  Psychiatric: Denied psychiatric symptoms, anxiety and depression.   Endocrine: Denied endocrine symptoms including hot flashes and night sweats.   Meds:   Current Outpatient Medications on File Prior to Visit  Medication Sig Dispense Refill   acetaminophen (TYLENOL) 500 MG tablet Take 1,000 mg by mouth every 6 (six) hours as needed.     aspirin EC 81 MG tablet Take 1 tablet (81 mg total) by mouth daily. Swallow whole. 30 tablet 12   buPROPion (WELLBUTRIN XL) 150 MG 24 hr tablet Take 1 tablet (150 mg total) by mouth daily. 90 tablet 1   cyclobenzaprine (FLEXERIL) 10 MG tablet Take 1 tablet by oral route at bedtime. 30 tablet 0   gabapentin (NEURONTIN) 300 MG capsule TAKE 2 CAPSULES BY MOUTH ONCE DAILY AND 3 NIGHTLY     ibuprofen (ADVIL) 200 MG tablet Take 200 mg by mouth every 6 (six) hours as needed.     levothyroxine (SYNTHROID) 88 MCG tablet TAKE 1 TABLET BY MOUTH ONCE DAILY BEFORE BREAKFAST 90 tablet 0   loperamide (IMODIUM) 2 MG capsule Take 2 mg by mouth as needed for diarrhea or loose stools.     nitroGLYCERIN (NITROSTAT) 0.4 MG SL tablet Place under the tongue.     omeprazole (PRILOSEC OTC) 20 MG tablet Take 1 tablet (20 mg total) by mouth daily.     ondansetron (ZOFRAN) 4 MG tablet Take 1 tablet (4 mg total) by mouth every 8 (eight) hours as needed for nausea or vomiting. 20 tablet 0   traMADol (ULTRAM) 50 MG tablet Take 1 tablet (50 mg total) by mouth daily as needed. 30 tablet 0   venlafaxine XR (EFFEXOR XR) 37.5 MG 24 hr capsule Take 1 capsule (37.5 mg total) by mouth daily with breakfast. 30 capsule 5   No current facility-administered medications on file prior to visit.      Objective:     Vitals:   06/17/22 1443  BP: 110/75  Pulse: 79   Filed Weights   06/17/22 1443  Weight: 182 lb (82.6 kg)                        Assessment:    [id number] Patient Active Problem List   Diagnosis Date Noted   GERD (gastroesophageal reflux disease) 04/14/2022   Class 1 obesity due to excess calories with body mass index (BMI) of 33.0 to 33.9  in adult 09/09/2021   Chronic diarrhea of unknown origin    Neutropenia ([place]) 02/04/2021   Hot flashes due to menopause 09/12/2020   Class 1 obesity due to excess calories with body mass index (BMI) of 32.0 to 32.9 in adult 06/30/2018   Dyslipidemia 06/21/2017   Depression, recurrent ([place]) 12/17/2014   Vaginal stenosis 12/19/2013   History of anal cancer 07/20/2012   Coronary artery disease involving native coronary artery of native heart with angina pectoris ([place]) 08/27/2010   DDD (degenerative disc disease), lumbosacral 07/28/2010   Hypothyroidism 02/06/2010     1. Pelvic pain   2. Fibroid        Plan:            1.  We have talked through all of the above studies and I have generally reassured her regarding her uterine fibroid and her endometrium.  I do not believe these to be a significant possible source of cancer.  We have also discussed cervical cancer and the fact that she had normal Pap smears her whole life.  It is possible that HPV of the rectum increases her risk for HPV of the cervix.  I have asked her to address this with her oncologist/surgeon.  We have also discussed the possibility of Pap smears in the OR should this be necessary.  At this time she would like to have pelvic floor therapy and try to open the vagina which will allow her to have intercourse as well as possibly Pap smears in the future. All of her questions were answered and I believe her issues were addressed. Orders No orders of the defined types were placed in this encounter.   No orders of the defined types were placed in this encounter.     F/U  No follow-ups on file. I spent 31 minutes involved in the care of this patient preparing to see the patient by obtaining and reviewing her medical history (including labs, imaging tests and prior procedures), documenting clinical information in the electronic health record (EHR), counseling and coordinating care plans, writing and sending prescriptions,  ordering tests or procedures and in direct communicating with the patient and medical staff discussing pertinent items from her history and physical exam.  Finis Bud, M.D. 06/17/2022 3:06 PM

## 2022-06-25 ENCOUNTER — Emergency Department (HOSPITAL_COMMUNITY): Payer: BC Managed Care – PPO

## 2022-06-25 ENCOUNTER — Emergency Department
Admission: EM | Admit: 2022-06-25 | Discharge: 2022-06-25 | Disposition: A | Discharge status: Home / Self Care | Payer: BC Managed Care – PPO | Attending: Family Medicine | Admitting: Family Medicine

## 2022-06-25 ENCOUNTER — Encounter (HOSPITAL_COMMUNITY): Payer: Self-pay | Admitting: Family Medicine

## 2022-06-25 ENCOUNTER — Other Ambulatory Visit: Payer: Self-pay

## 2022-06-25 DIAGNOSIS — N200 Calculus of kidney: Secondary | ICD-10-CM | POA: Insufficient documentation

## 2022-06-25 DIAGNOSIS — M47816 Spondylosis without myelopathy or radiculopathy, lumbar region: Secondary | ICD-10-CM | POA: Insufficient documentation

## 2022-06-25 DIAGNOSIS — Z87442 Personal history of urinary calculi: Secondary | ICD-10-CM | POA: Insufficient documentation

## 2022-06-25 DIAGNOSIS — M545 Low back pain, unspecified: Secondary | ICD-10-CM | POA: Insufficient documentation

## 2022-06-25 DIAGNOSIS — M62830 Muscle spasm of back: Secondary | ICD-10-CM | POA: Insufficient documentation

## 2022-06-25 DIAGNOSIS — G8929 Other chronic pain: Secondary | ICD-10-CM | POA: Insufficient documentation

## 2022-06-25 DIAGNOSIS — Z85048 Personal history of other malignant neoplasm of rectum, rectosigmoid junction, and anus: Secondary | ICD-10-CM | POA: Diagnosis not present

## 2022-06-25 LAB — URINALYSIS, MACROSCOPIC
BILIRUBIN: NEGATIVE mg/dL
BLOOD: NEGATIVE mg/dL
GLUCOSE: NEGATIVE mg/dL
KETONES: NEGATIVE mg/dL
LEUKOCYTES: NEGATIVE WBCs/uL
NITRITE: NEGATIVE
PH: 6 (ref 5.0–9.0)
PROTEIN: NEGATIVE mg/dL
SPECIFIC GRAVITY: 1.022 (ref 1.002–1.030)
UROBILINOGEN: NORMAL mg/dL

## 2022-06-25 LAB — COMPREHENSIVE METABOLIC PANEL, NON-FASTING
ALBUMIN/GLOBULIN RATIO: 1.8 — ABNORMAL HIGH (ref 0.8–1.4)
ALBUMIN: 4.2 g/dL (ref 3.5–5.7)
ALKALINE PHOSPHATASE: 91 U/L (ref 34–104)
ALT (SGPT): 10 U/L (ref 7–52)
ANION GAP: 7 mmol/L (ref 4–13)
AST (SGOT): 16 U/L (ref 13–39)
BILIRUBIN TOTAL: 0.3 mg/dL (ref 0.3–1.2)
BUN/CREA RATIO: 21 (ref 6–22)
BUN: 19 mg/dL (ref 7–25)
CALCIUM, CORRECTED: 9.2 mg/dL (ref 8.9–10.8)
CALCIUM: 9.4 mg/dL (ref 8.6–10.3)
CHLORIDE: 110 mmol/L — ABNORMAL HIGH (ref 98–107)
CO2 TOTAL: 24 mmol/L (ref 21–31)
CREATININE: 0.92 mg/dL (ref 0.60–1.30)
ESTIMATED GFR: 72 mL/min/{1.73_m2} (ref >59)
GLOBULIN: 2.3 — ABNORMAL LOW (ref 2.9–5.4)
GLUCOSE: 78 mg/dL (ref 74–109)
OSMOLALITY, CALCULATED: 282 mOsm/kg (ref 270–290)
POTASSIUM: 4.2 mmol/L (ref 3.5–5.1)
PROTEIN TOTAL: 6.5 g/dL (ref 6.4–8.9)
SODIUM: 141 mmol/L (ref 136–145)

## 2022-06-25 LAB — CBC WITH DIFF
BASOPHIL #: 0 10*3/uL (ref 0.00–0.10)
BASOPHIL %: 1 % (ref 0–1)
EOSINOPHIL #: 0.1 10*3/uL (ref 0.00–0.50)
EOSINOPHIL %: 3 %
HCT: 39.8 % (ref 31.2–41.9)
HGB: 13.3 g/dL (ref 10.9–14.3)
LYMPHOCYTE #: 1.3 10*3/uL (ref 1.00–3.00)
LYMPHOCYTE %: 29 % (ref 16–44)
MCH: 33.5 pg — ABNORMAL HIGH (ref 24.7–32.8)
MCHC: 33.4 g/dL (ref 32.3–35.6)
MCV: 100.4 fL — ABNORMAL HIGH (ref 75.5–95.3)
MONOCYTE #: 0.4 10*3/uL (ref 0.30–1.00)
MONOCYTE %: 9 % (ref 5–13)
MPV: 8.1 fL (ref 7.9–10.8)
NEUTROPHIL #: 2.6 10*3/uL (ref 1.85–7.80)
NEUTROPHIL %: 59 % (ref 43–77)
PLATELETS: 204 10*3/uL (ref 140–440)
RBC: 3.96 10*6/uL (ref 3.63–4.92)
RDW: 14.1 % (ref 12.3–17.7)
WBC: 4.5 10*3/uL (ref 3.8–11.8)

## 2022-06-25 LAB — GOLD TOP TUBE

## 2022-06-25 LAB — URINALYSIS, MICROSCOPIC
RBCS: <1 /hpf (ref <4)
SQUAMOUS EPITHELIAL: 1 /hpf (ref <28)
WBCS: 1 /hpf (ref <6)

## 2022-06-25 LAB — BLUE TOP TUBE

## 2022-06-25 MED ORDER — KETOROLAC 30 MG/ML (1 ML) INJECTION SOLUTION
30.0000 mg | INTRAMUSCULAR | Status: AC
Start: 2022-06-25 — End: 2022-06-25
  Administered 2022-06-25: 30 mg via INTRAMUSCULAR

## 2022-06-25 MED ORDER — METHYLPREDNISOLONE SOD SUCCINATE 40 MG/ML SOLUTION FOR INJ. WRAPPER
40.0000 mg | INTRAMUSCULAR | Status: AC
Start: 2022-06-25 — End: 2022-06-25
  Administered 2022-06-25: 40 mg via INTRAMUSCULAR

## 2022-06-25 MED ORDER — KETOROLAC 30 MG/ML (1 ML) INJECTION SOLUTION
INTRAMUSCULAR | Status: AC
Start: 2022-06-25 — End: 2022-06-25
  Filled 2022-06-25: qty 1

## 2022-06-25 MED ORDER — METHYLPREDNISOLONE SOD SUCCINATE 40 MG/ML SOLUTION FOR INJ. WRAPPER
INTRAMUSCULAR | Status: AC
Start: 2022-06-25 — End: 2022-06-25
  Filled 2022-06-25: qty 1

## 2022-06-25 NOTE — ED Provider Notes (Signed)
[facility]  ED Primary Provider Note  Patient Name: Rhonda Pittman  Patient Age: 57 y.o.  Date of Birth: 05/11/1965    Chief Complaint: Back Pain        History of Present Illness       Rhonda Pittman is a 57 y.o. female who had concerns including Back Pain.  PATIENT PRESENTED TO THE EMERGENCY DEPARTMENT WITH COMPLAINTS OF RIGHT-SIDED LOW BACK PAIN OVER THE LAST 3 DAYS.  PATIENT COMPLAINS OF RIGHT LUMBAR PAIN WITH SOME RADIATION INTO THE RIGHT LATERAL ABDOMEN.  PATIENT STATES THAT SHE WAS PREVIOUSLY TOLD THAT SHE HAD A KIDNEY STONE, BUT HAS NEVER PASSED IT.  SHE DENIES ANY ASSOCIATED NAUSEA OR VOMITING, FEVERS/CHILLS, CHEST PAIN, SHORTNESS ON BREATH, RECENT CHANGES IN BOWEL OR BLADDER HABITS.  PATIENT DENIES ANY BLOOD IN THE URINE OR DARKENING OF THE URINE.  SHE DENIES ANY BURNING WITH URINATION.  SHE DENIES ANY ABDOMINAL PAIN.  PATIENT HAS BEEN USING TRAMADOL, ANTI-INFLAMMATORY AND MUSCLE RELAXER AT HOME WITHOUT ANY SIGNIFICANT IMPROVEMENT IN SYMPTOMS.  PATIENT DENIES ANY RADIATION OF PAIN INTO THE RIGHT LOWER EXTREMITY OR NUMBNESS/TINGLING.  SHE DENIES ANY TRAUMA TO THE AREA.  NOTHING REALLY SEEMS TO MAKE HER SYMPTOMS BETTER.  PATIENT WAS UNCOMFORTABLE, BUT IN NO APPARENT DISTRESS AND DENIES ANY FURTHER COMPLAINTS AT TIME OF EXAMINATION.        Review of Systems     No other overt Review of Systems are noted to be positive except noted in the HPI.      Historical Data   History Reviewed This Encounter: Medical History  Surgical History  Family History  Social History      Physical Exam   ED Triage Vitals [06/25/22 1222]   BP (Non-Invasive) (!) 144/76   Heart Rate 93   Respiratory Rate 18   Temperature 36.5 C (97.7 F)   SpO2 96 %   Weight 82.6 kg (182 lb)   Height 1.6 m ('5\' 3"'$ )         Nursing notes reviewed for what could be assessed. Past Medical, Surgical, and Social history reviewed for what has been completed.     Constitutional: NAD. Well-Developed. Well  Nourished.  Head: Normocephalic, atraumatic.  Mouth/Throat: no nasal discharge  Eyes: EOM grossly intact, conjunctiva normal.  Cardiovascular: Regular Rate and Rhythm, extremities well perfused.  Pulmonary/Chest: No respiratory distress. Lungs are symmetric to auscultation bilaterally.  Abdominal: Soft, non-tender, non-distended. Non peritoneal, no rebound, no guarding.  MSK:  TENDERNESS TO PALPATION OF THE RIGHT LUMBAR SPINE WITH ASSOCIATED MUSCLE SPASM PRESENT ON EXAM.  NO APPRECIATED STEP-OFF DEFORMITY OR SIGNIFICANT TENDERNESS TO PALPATION OVER THE VERTEBRAE.  MILD RIGHT CVA TENDERNESS TO PALPATION NOTED ON EXAM.  Skin: Warm, dry, and intact  Neuro: Appropriate, CN II-XII grossly intact   Psych: Cooperative           Procedures      Patient Data     Labs Ordered/Reviewed   COMPREHENSIVE METABOLIC PANEL, NON-FASTING - Abnormal; Notable for the following components:       Result Value    CHLORIDE 110 (*)     ALBUMIN/GLOBULIN RATIO 1.8 (*)     GLOBULIN 2.3 (*)     All other components within normal limits    Narrative:     Estimated Glomerular Filtration Rate (eGFR) is calculated using the CKD-EPI (2021) equation, intended for patients 33 years of age and older. If gender is not documented or "unknown", there will be no  eGFR calculation.     CBC WITH DIFF - Abnormal; Notable for the following components:    MCV 100.4 (*)     MCH 33.5 (*)     All other components within normal limits   URINALYSIS, MICROSCOPIC - Abnormal; Notable for the following components:    MUCOUS Rare (*)     All other components within normal limits   URINALYSIS, MACROSCOPIC - Normal   CBC/DIFF    Narrative:     The following orders were created for panel order CBC/DIFF.  Procedure                               Abnormality         Status                     ---------                               -----------         ------                     CBC WITH [id number]                Abnormal            Final result                 Please view  results for these tests on the individual orders.   URINALYSIS, MACROSCOPIC AND MICROSCOPIC W/CULTURE REFLEX    Narrative:     The following orders were created for panel order URINALYSIS, MACROSCOPIC AND MICROSCOPIC W/CULTURE REFLEX [PRN ONLY].  Procedure                               Abnormality         Status                     ---------                               -----------         ------                     URINALYSIS, MACROSCOPIC[[id number]]      Normal              Final result               URINALYSIS, MICROSCOPIC[[id number]]      Abnormal            Final result                 Please view results for these tests on the individual orders.   BLUE TOP TUBE   EXTRA TUBES    Narrative:     The following orders were created for panel order EXTRA TUBES.  Procedure                               Abnormality         Status                     ---------                               -----------         ------  BLUE TOP [id number]                                    Final result               GOLD TOP [id number]                                    In process                   Please view results for these tests on the individual orders.   GOLD TOP TUBE       CT RENAL CALCULI SERIES WO IV CONTRAST   Final Result by Edi, Radresults In (02/22 1432)   1. NO UROLITHIASIS OR HYDRONEPHROSIS.  NO ACUTE FINDINGS.   2. LEFT NEPHROLITHIASIS.          One or more dose reduction techniques were used (e.g., Automated exposure control, adjustment of the mA and/or kV according to patient size, use of iterative reconstruction technique).         Radiologist location ID: [id number]         XR LUMBOSACRAL SPINE   Final Result by Edi, Radresults In (02/22 1258)   MILD DEGENERATIVE CHANGES OF THE LUMBAR SPINE.      Calcification in the area of the left kidney               Radiologist location ID: [id number] Decision Making          Medical Decision Making        Studies Assessed:  LAB  WORK AND IMAGING        MDM Narrative:  PATIENT PRESENTED TO THE EMERGENCY DEPARTMENT WITH COMPLAINTS OF RIGHT-SIDED LOW BACK PAIN OVER THE LAST 3 DAYS.  PATIENT COMPLAINS OF RIGHT LUMBAR PAIN WITH SOME RADIATION INTO THE RIGHT LATERAL ABDOMEN.  PATIENT STATES THAT SHE WAS PREVIOUSLY TOLD THAT SHE HAD A KIDNEY STONE, BUT HAS NEVER PASSED IT.  SHE DENIES ANY ASSOCIATED NAUSEA OR VOMITING, FEVERS/CHILLS, CHEST PAIN, SHORTNESS ON BREATH, RECENT CHANGES IN BOWEL OR BLADDER HABITS.  PATIENT DENIES ANY BLOOD IN THE URINE OR DARKENING OF THE URINE.  SHE DENIES ANY BURNING WITH URINATION.  SHE DENIES ANY ABDOMINAL PAIN.  PATIENT HAS BEEN USING TRAMADOL, ANTI-INFLAMMATORY AND MUSCLE RELAXER AT HOME WITHOUT ANY SIGNIFICANT IMPROVEMENT IN SYMPTOMS.  PATIENT DENIES ANY RADIATION OF PAIN INTO THE RIGHT LOWER EXTREMITY OR NUMBNESS/TINGLING.  SHE DENIES ANY TRAUMA TO THE AREA.  NOTHING REALLY SEEMS TO MAKE HER SYMPTOMS BETTER.  PATIENT WAS UNCOMFORTABLE, BUT IN NO APPARENT DISTRESS AND DENIES ANY FURTHER COMPLAINTS AT TIME OF EXAMINATION.  PHYSICAL EXAMINATION REVEALS MUSCLE SPASM OF THE RIGHT LUMBAR REGION WITH SOME ASSOCIATED PALPATION. PATIENT ALSO HAS RIGHT CVA TENDERNESS TO PALPATION ON EXAM.  PATIENT HAS NO ABDOMINAL TENDERNESS TO PALPATION ON EXAM.  PATIENT HAS NO NOTED NEUROLOGIC DEFICIT.  HEART AND LUNG EXAM WITHIN NORMAL LIMITS.  PATIENT IN NO APPARENT DISTRESS.  LAB WORK AND IMAGING WERE ORDERED IN TRIAGE.  LAB WORK WAS MOSTLY UNREMARKABLE.  X-RAY IMAGING REVEALED MILD DEGENERATIVE CHANGES OF THE LUMBAR SPINE, AS WELL AS A CALCIFICATION IN THE AREA OF THE LEFT KIDNEY.  CT IMAGING WAS ORDERED.  SOLU-MEDROL AND TORADOL WERE ORDERED.  PATIENT  WAS STABLE.  URINALYSIS IS PENDING.      ED Course as of 06/25/22 1530   Thu Jun 25, 2022   1323 WBC 4.5, HEMOGLOBIN 13.3, PLATELET COUNT 204   1323 X-RAY IMAGING OF THE LUMBOSACRAL SPINE REVEALED:   FINDINGS:  Normal lumbar vertebral heights.  No evidence of fracture.  Mild disc  space narrowing at L5-S1.     Multilevel facet joint narrowing.  Normal alignment.  No spondylolisthesis.     Degenerative changes of the SI joints.  Calcification in the area of the left kidney measuring 7 x 3 mm.     IMPRESSION:  MILD DEGENERATIVE CHANGES OF THE LUMBAR SPINE.     Calcification in the area of the left kidney   1324 CT IMAGING ORDERED   1330 SODIUM 141, POTASSIUM 4.2, BUN 19, CREATININE 0.92, GLUCOSE 78, TOTAL BILIRUBIN 0.3, AST 16, ALT 10, TOTAL ALKALINE PHOSPHATASE 91   1438 URINALYSIS DID NOT REVEAL EVIDENCE OF HEMATURIA OR URINARY TRACT INFECTION   1439 CT RENAL STUDY REVEALED:   FINDINGS:  Noncontrast technique limits evaluation of the abdominal and pelvic viscera.     Lung bases: Clear     Liver:   Unremarkable.     Gallbladder:   Unremarkable.     Spleen:   Unremarkable.     Pancreas:   Unremarkable.     Adrenals:   Unremarkable.     Kidneys:   Lower pole calculi on the left, the larger measuring 4 mm in short axis. No ureteral calculi or hydronephrosis.     Bladder:  Unremarkable.     Uterus and Adnexa:  Unremarkable.     Bowel:   Unremarkable.     Appendix:  Normal.     Lymph nodes:  No suspicious lymph node enlargement.     Vasculature:   Major vascular structures are unremarkable.      Peritoneum / Retroperitoneum: No ascites.  No free air.     Bones:   Unremarkable.           IMPRESSION:  1.         NO UROLITHIASIS OR HYDRONEPHROSIS.  NO ACUTE FINDINGS.  2.         LEFT NEPHROLITHIASIS.    1446 PATIENT HAS NOT YET RECEIVED HER MEDICATION     ON REEXAMINATION, PATIENT ADMITS TO SOME MILD IMPROVEMENT IN SYMPTOMS.  PATIENT WAS COUNSELED AND EDUCATED ON LABORATORY AND IMAGING FINDINGS.  PATIENT DOES HAVE TRAMADOL, ANTI-INFLAMMATORY AND METHOCARBAMOL AT HOME.  SHE HAS ONLY BEEN TAKING 1/2 TABLET OF METHOCARBAMOL.  PATIENT WAS COUNSELED AND EDUCATED ON PROPER DOSAGE, AS WELL AS PROPER USE AND MOST COMMON SIDE EFFECTS.  PATIENT WAS COUNSELED AND EDUCATED ON SUPPORTIVE CARE AT HOME.  SHE WAS  INSTRUCTED TO FOLLOW UP WITH PCP IN THE NEXT 1-2 DAYS TO RECHECK SYMPTOMS AND WAS GIVEN VERY CLEAR INSTRUCTIONS TO RETURN TO THE EMERGENCY DEPARTMENT FOR ANY NEW OR WORSENING SYMPTOMS.  PATIENT VERBALIZED UNDERSTANDING.  PATIENT WAS SMILING AND STABLE AT TIME OF DISCHARGE.  ALL QUESTIONS WERE ANSWERED TO SATISFACTION.    Medications Administered in the ED   ketorolac (TORADOL) 30 mg/mL injection (30 mg IntraMUSCULAR Given 06/25/22 1447)   methylPREDNISolone sod succ (SOLU-medrol) 40 mg/mL injection (40 mg IntraMUSCULAR Given 06/25/22 1446)       Following the history, physical exam, and ED workup, the patient was deemed stable and suitable for discharge. The patient/caregiver was advised to return to the ED for any new or worsening symptoms.  Discharge medications, and follow-up instructions were discussed with the patient/caregiver in detail, who verbalizes understanding. The patient/caregiver is in agreement and is comfortable with the plan of care.    Disposition: Discharged         Current Discharge Medication List      You have not been prescribed any medications.       Follow up:   [facility]  [place]  [place] [phone]  [id number]    As needed, If symptoms worsen               Clinical Impression   Low back pain (Primary)   Lumbar paraspinal muscle spasm   Kidney stone         There are no discharge medications for this patient.        Thera Flake, DO

## 2022-06-25 NOTE — ED Triage Notes (Signed)
Back pain x3 days. Denies injury.

## 2022-06-25 NOTE — ED Nurses Note (Signed)
Pt is seen, treated, and now d/c from PIT. She has had some resolution of pain. She is given d/c paperwork, questions are answered, and she is encouraged to return if worsening or any other needs. She leaves the Er waiting room with her spouse.

## 2022-06-25 NOTE — ED APP Handoff Note (Signed)
[facility]  Emergency Department  Provider in Triage Note    Name: IOLA RITTER  Age: 57 y.o.  Gender: female     Subjective:   LISELLE ZOMBRO is a 57 y.o. female who presents with complaint of No chief complaint on file.  .  Back pain x3 days.  Denies acute injury.  Denies any bowel or bladder incontinence.  Has a history of chronic back pain on tramadol-no relief.  Denies urinary symptoms.    Objective:   Filed Vitals:    06/25/22 1222   BP: (!) 144/76   Pulse: 93   Resp: 18   Temp: 36.5 C (97.7 F)   SpO2: 96%      Focused Physical Exam shows awake and alert x3.    Assessment:  A medical screening exam was completed.  This patient is a 57 y.o. female with initial findings showing back pain  Plan:  Please see initial orders and work-up below.  This is to be continued with full evaluation in the main Emergency Department.     No current facility-administered medications for this encounter.     Results for orders placed or performed during the hospital encounter of 06/25/22 (from the past 24 hour(s))   CBC/DIFF    Narrative    The following orders were created for panel order CBC/DIFF.  Procedure                               Abnormality         Status                     ---------                               -----------         ------                     CBC WITH [id number]                                                                 Please view results for these tests on the individual orders.   URINALYSIS, MACROSCOPIC AND MICROSCOPIC W/CULTURE REFLEX [PRN ONLY]    Specimen: Urine, Clean Catch    Narrative    The following orders were created for panel order URINALYSIS, MACROSCOPIC AND MICROSCOPIC W/CULTURE REFLEX [PRN ONLY].  Procedure                               Abnormality         Status                     ---------                               -----------         ------                     URINALYSIS, MACROSCOPIC[[id number]]  URINALYSIS, MICROSCOPIC[[id number]]                                                       Please view results for these tests on the individual orders.        Boris Lown, APRN  06/25/2022, 12:22

## 2022-06-30 ENCOUNTER — Encounter: Payer: Self-pay | Admitting: Internal Medicine

## 2022-07-09 ENCOUNTER — Ambulatory Visit: Payer: BC Managed Care – PPO | Attending: Pediatrics | Admitting: Physical Therapy

## 2022-07-13 ENCOUNTER — Other Ambulatory Visit: Payer: Self-pay | Admitting: Internal Medicine

## 2022-07-13 MED ORDER — TRAMADOL HCL 50 MG PO TABS: 50.0000 mg | ORAL_TABLET | Freq: Every day | ORAL | 0 refills | Status: DC | PRN

## 2022-07-15 ENCOUNTER — Encounter: Payer: Self-pay | Admitting: Obstetrics and Gynecology

## 2022-07-16 ENCOUNTER — Other Ambulatory Visit: Payer: Self-pay | Admitting: Obstetrics and Gynecology

## 2022-07-16 DIAGNOSIS — N941 Unspecified dyspareunia: Secondary | ICD-10-CM

## 2022-07-16 MED ORDER — OSPEMIFENE 60 MG PO TABS: 60.0000 mg | ORAL_TABLET | Freq: Every day | ORAL | 2 refills | Status: DC

## 2022-07-17 ENCOUNTER — Ambulatory Visit: Payer: BC Managed Care – PPO | Admitting: Physical Therapy

## 2022-07-21 ENCOUNTER — Telehealth: Payer: Self-pay | Admitting: Physical Therapy

## 2022-07-21 NOTE — Telephone Encounter (Signed)
Physical therapist called about her 2 no shows and explained policy. Pt was interested in keeping her appt this week. Confirmed date and time with pt.

## 2022-07-24 ENCOUNTER — Ambulatory Visit: Payer: BC Managed Care – PPO | Admitting: Physical Therapy

## 2022-07-31 ENCOUNTER — Ambulatory Visit: Payer: BC Managed Care – PPO | Admitting: Physical Therapy

## 2022-08-04 ENCOUNTER — Encounter: Payer: BC Managed Care – PPO | Admitting: Physical Therapy

## 2022-08-11 ENCOUNTER — Encounter: Payer: BC Managed Care – PPO | Admitting: Physical Therapy

## 2022-08-17 ENCOUNTER — Other Ambulatory Visit: Payer: Self-pay | Admitting: Internal Medicine

## 2022-08-18 ENCOUNTER — Encounter: Payer: BC Managed Care – PPO | Admitting: Physical Therapy

## 2022-08-18 MED ORDER — TRAMADOL HCL 50 MG PO TABS: 50.0000 mg | ORAL_TABLET | Freq: Every day | ORAL | 0 refills | Status: DC | PRN

## 2022-08-25 ENCOUNTER — Encounter: Payer: BC Managed Care – PPO | Admitting: Physical Therapy

## 2022-09-01 ENCOUNTER — Encounter: Payer: BC Managed Care – PPO | Admitting: Physical Therapy

## 2022-09-02 ENCOUNTER — Ambulatory Visit: Payer: BC Managed Care – PPO | Admitting: Internal Medicine

## 2022-09-02 ENCOUNTER — Encounter: Payer: Self-pay | Admitting: Internal Medicine

## 2022-09-02 VITALS — BP 122/84 | HR 82 | Temp 96.9°F | Wt 184.0 lb

## 2022-09-02 DIAGNOSIS — N895 Stricture and atresia of vagina: Secondary | ICD-10-CM

## 2022-09-02 DIAGNOSIS — R600 Localized edema: Secondary | ICD-10-CM | POA: Diagnosis not present

## 2022-09-02 MED ORDER — HYDROCHLOROTHIAZIDE 12.5 MG PO CAPS: 12.5000 mg | ORAL_CAPSULE | Freq: Every day | ORAL | 0 refills | Status: DC | PRN

## 2022-09-02 NOTE — Progress Notes (Signed)
Subjective:    Patient ID: Brenda Grimes, female    DOB: Apr 26, 1966, 57 y.o.   MRN: [id number]  HPI  Patient presents to clinic today with complaint of swelling in feet.  She noticed this about 3 days ago, worsened yesterday. The swelling is not painful but it is tight. She has not noticed any redness.  She has had some intermittent shortness of breath that is mild but denies chest pain or significant weight gain.  She has no history of CHF.  She is not taking Gabapentin.  Review of Systems  Past Medical History:  Diagnosis Date   anal cancer    anal cancer , underwent radiation and chemo   Arthritis    CAD (coronary artery disease)    s/p stenting x 2   Colon polyp    Depression    Hyperlipidemia    MI (myocardial infarction) (HCC)    Thyroid disease    Vitamin D deficiency     Current Outpatient Medications  Medication Sig Dispense Refill   acetaminophen (TYLENOL) 500 MG tablet Take 1,000 mg by mouth every 6 (six) hours as needed.     aspirin EC 81 MG tablet Take 1 tablet (81 mg total) by mouth daily. Swallow whole. 30 tablet 12   buPROPion (WELLBUTRIN XL) 150 MG 24 hr tablet Take 1 tablet (150 mg total) by mouth daily. 90 tablet 1   cyclobenzaprine (FLEXERIL) 10 MG tablet Take 1 tablet by oral route at bedtime. 30 tablet 0   gabapentin (NEURONTIN) 300 MG capsule TAKE 2 CAPSULES BY MOUTH ONCE DAILY AND 3 NIGHTLY     ibuprofen (ADVIL) 200 MG tablet Take 200 mg by mouth every 6 (six) hours as needed.     levothyroxine (SYNTHROID) 88 MCG tablet TAKE 1 TABLET BY MOUTH ONCE DAILY BEFORE BREAKFAST 90 tablet 0   loperamide (IMODIUM) 2 MG capsule Take 2 mg by mouth as needed for diarrhea or loose stools.     nitroGLYCERIN (NITROSTAT) 0.4 MG SL tablet Place under the tongue.     omeprazole (PRILOSEC OTC) 20 MG tablet Take 1 tablet (20 mg total) by mouth daily.     ondansetron (ZOFRAN) 4 MG tablet Take 1 tablet (4 mg total) by mouth every 8 (eight) hours as needed for nausea or  vomiting. 20 tablet 0   Ospemifene 60 MG TABS Take 1 tablet (60 mg total) by mouth daily. 30 tablet 2   traMADol (ULTRAM) 50 MG tablet Take 1 tablet (50 mg total) by mouth daily as needed. 30 tablet 0   venlafaxine XR (EFFEXOR XR) 37.5 MG 24 hr capsule Take 1 capsule (37.5 mg total) by mouth daily with breakfast. 30 capsule 5   No current facility-administered medications for this visit.    Allergies  Allergen Reactions   Sulfa Antibiotics Rash   Sulfasalazine Rash   Tape Rash    Family History  Problem Relation Age of Onset   Alzheimer's disease Mother    CAD Father    Thyroid disease Brother    Hyperlipidemia Brother    Hyperlipidemia Brother    Alcohol abuse Brother    Thyroid disease Brother    Cirrhosis Brother    Alzheimer's disease Maternal Grandmother    Heart disease Paternal Grandmother    Heart disease Paternal Grandfather    Healthy Daughter    Healthy Son    Healthy Son    Heart disease Paternal Uncle    Breast cancer Cousin    Cancer Paternal  Aunt        Possible colon cancer    Social History   Socioeconomic History   Marital status: Married    Spouse name: Not on file   Number of children: Not on file   Years of education: Not on file   Highest education level: Not on file  Occupational History   Not on file  Tobacco Use   Smoking status: Former    Packs/day: 0.75    Years: 30.00    Additional pack years: 0.00    Total pack years: 22.50    Types: Cigarettes    Quit date: 01/21/2007    Years since quitting: 15.6   Smokeless tobacco: Never  Vaping Use   Vaping Use: Never used  Substance and Sexual Activity   Alcohol use: No   Drug use: No   Sexual activity: Not Currently    Birth control/protection: None  Other Topics Concern   Not on file  Social History Narrative   Not on file   Social Determinants of Health   Financial Resource Strain: Low Risk  (12/05/2021)   Overall Financial Resource Strain (CARDIA)    Difficulty of Paying  Living Expenses: Not hard at all  Food Insecurity: No Food Insecurity (12/05/2021)   Hunger Vital Sign    Worried About Running Out of Food in the Last Year: Never true    Ran Out of Food in the Last Year: Never true  Transportation Needs: No Transportation Needs (12/05/2021)   PRAPARE - Administrator, Civil Service (Medical): No    Lack of Transportation (Non-Medical): No  Physical Activity: Insufficiently Active (12/05/2021)   Exercise Vital Sign    Days of Exercise per Week: 3 days    Minutes of Exercise per Session: 30 min  Stress: No Stress Concern Present (12/05/2021)   [employer] of Occupational Health - Occupational Stress Questionnaire    Feeling of Stress : Not at all  Social Connections: Moderately Isolated (12/05/2021)   Social Connection and Isolation Panel [NHANES]    Frequency of Communication with Friends and Family: More than three times a week    Frequency of Social Gatherings with Friends and Family: Three times a week    Attends Religious Services: Never    Active Member of Clubs or Organizations: No    Attends Banker Meetings: Never    Marital Status: Married  Catering manager Violence: Not At Risk (12/05/2021)   Humiliation, Afraid, Rape, and Kick questionnaire    Fear of Current or Ex-Partner: No    Emotionally Abused: No    Physically Abused: No    Sexually Abused: No     Constitutional: Denies fever, malaise, fatigue, headache or abrupt weight changes.  Respiratory: Denies difficulty breathing, shortness of breath, cough or sputum production.   Cardiovascular: Patient reports swelling in feet.  Denies chest pain, chest tightness, palpitations or swelling in the hands.  Musculoskeletal: Patient reports joint pain.  Denies decrease in range of motion, difficulty with gait, muscle pain or joint swelling.  Skin: Denies redness, rashes, lesions or ulcercations.  Neurological: Denies dizziness, difficulty with memory, difficulty with  speech or problems with balance and coordination.  Psych: Patient has a history of depression.  Denies anxiety, SI/HI.  No other specific complaints in a complete review of systems (except as listed in HPI above).     Objective:   Physical Exam   BP 122/84 (BP Location: Right Arm, Patient Position: Sitting, Cuff Size: Normal)  Pulse 82   Temp (!) 96.9 F (36.1 C) (Temporal)   Wt 184 lb (83.5 kg)   SpO2 99%   BMI 32.59 kg/m   Wt Readings from Last 3 Encounters:  06/17/22 182 lb (82.6 kg)  04/14/22 182 lb (82.6 kg)  03/17/22 181 lb (82.1 kg)    General: Appears her stated age, obese in NAD. Skin: Warm, dry and intact.  HEENT: Head: normal shape and size; Eyes: sclera white, no icterus, conjunctiva pink, PERRLA and EOMs intact;  Cardiovascular: Normal rate and rhythm. S1,S2 noted.  No murmur, rubs or gallops noted. No JVD.  Trace nonpitting BLE edema.  Pulmonary/Chest: Normal effort and positive vesicular breath sounds. No respiratory distress. No wheezes, rales or ronchi noted.  Musculoskeletal:  No difficulty with gait.  Neurological: Alert and oriented.     BMET    Component Value Date/Time   NA 143 04/14/2022 0909   K 4.0 04/14/2022 0909   CL 108 04/14/2022 0909   CO2 28 04/14/2022 0909   GLUCOSE 112 (H) 04/14/2022 0909   BUN 16 04/14/2022 0909   CREATININE 0.73 04/14/2022 0909   CALCIUM 9.2 04/14/2022 0909   GFRNONAA >60 08/08/2021 1509   GFRNONAA 79 04/04/2020 0904   GFRAA 91 04/04/2020 0904    Lipid Panel     Component Value Date/Time   CHOL 195 04/14/2022 0909   TRIG 134 04/14/2022 0909   HDL 62 04/14/2022 0909   CHOLHDL 3.1 04/14/2022 0909   LDLCALC 108 (H) 04/14/2022 0909    CBC    Component Value Date/Time   WBC 2.8 (L) 04/14/2022 0909   RBC 3.67 (L) 04/14/2022 0909   HGB 12.3 04/14/2022 0909   HCT 36.0 04/14/2022 0909   PLT 212 04/14/2022 0909   MCV 98.1 04/14/2022 0909   MCH 33.5 (H) 04/14/2022 0909   MCHC 34.2 04/14/2022 0909   RDW  12.5 04/14/2022 0909   LYMPHSABS 921 07/24/2021 1010   MONOABS 0.3 04/15/2020 1209   EOSABS 60 07/24/2021 1010   BASOSABS 39 07/24/2021 1010    Hgb A1C Lab Results  Component Value Date   HGBA1C 5.2 09/09/2021          Assessment & Plan:   Peripheral Edema:  Likely combination of her weight and standing on her feet all day Reinforced low-salt diet Encourage elevation and use of compression socks Rx for HCTZ 12.5 mg daily  RTC in 1 month for annual exam Nicki Reaper, NP

## 2022-09-02 NOTE — Assessment & Plan Note (Signed)
Encourage diet and exercise for weight loss 

## 2022-09-02 NOTE — Patient Instructions (Signed)
Peripheral Edema  Peripheral edema is swelling that is caused by a buildup of fluid. Peripheral edema most often affects the lower legs, ankles, and feet. It can also develop in the arms, hands, and face. The area of the body that has peripheral edema will look swollen. It may also feel heavy or warm. Your clothes may start to feel tight. Pressing on the area may make a temporary dent in your skin (pitting edema). You may not be able to move your swollen arm or leg as much as usual. There are many causes of peripheral edema. It can happen because of a complication of other conditions such as heart failure, kidney disease, or a problem with your circulation. It also can be a side effect of certain medicines or happen because of an infection. It often happens to women during pregnancy. Sometimes, the cause is not known. Follow these instructions at home: Managing pain, stiffness, and swelling  Raise (elevate) your legs while you are sitting or lying down. Move around often to prevent stiffness and to reduce swelling. Do not sit or stand for long periods of time. Do not wear tight clothing. Do not wear garters on your upper legs. Exercise your legs to get your circulation going. This helps to move the fluid back into your blood vessels, and it may help the swelling go down. Wear compression stockings as told by your health care provider. These stockings help to prevent blood clots and reduce swelling in your legs. It is important that these are the correct size. These stockings should be prescribed by your doctor to prevent possible injuries. If elastic bandages or wraps are recommended, use them as told by your health care provider. Medicines Take over-the-counter and prescription medicines only as told by your health care provider. Your health care provider may prescribe medicine to help your body get rid of excess water (diuretic). Take this medicine if you are told to take it. General  instructions Eat a low-salt (low-sodium) diet as told by your health care provider. Sometimes, eating less salt may reduce swelling. Pay attention to any changes in your symptoms. Moisturize your skin daily to help prevent skin from cracking and draining. Keep all follow-up visits. This is important. Contact a health care provider if: You have a fever. You have swelling in only one leg. You have increased swelling, redness, or pain in one or both of your legs. You have drainage or sores at the area where you have edema. Get help right away if: You have edema that starts suddenly or is getting worse, especially if you are pregnant or have a medical condition. You develop shortness of breath, especially when you are lying down. You have pain in your chest or abdomen. You feel weak. You feel like you will faint. These symptoms may be an emergency. Get help right away. Call 911. Do not wait to see if the symptoms will go away. Do not drive yourself to the hospital. Summary Peripheral edema is swelling that is caused by a buildup of fluid. Peripheral edema most often affects the lower legs, ankles, and feet. Move around often to prevent stiffness and to reduce swelling. Do not sit or stand for long periods of time. Pay attention to any changes in your symptoms. Contact a health care provider if you have edema that starts suddenly or is getting worse, especially if you are pregnant or have a medical condition. Get help right away if you develop shortness of breath, especially when lying down.   This information is not intended to replace advice given to you by your health care provider. Make sure you discuss any questions you have with your health care provider. Document Revised: 12/23/2020 Document Reviewed: 12/23/2020 Elsevier Patient Education  2023 Elsevier Inc.  

## 2022-09-08 ENCOUNTER — Encounter: Payer: BC Managed Care – PPO | Admitting: Physical Therapy

## 2022-09-09 ENCOUNTER — Other Ambulatory Visit: Payer: Self-pay | Admitting: Internal Medicine

## 2022-09-10 NOTE — Telephone Encounter (Signed)
Requested Prescriptions  Pending Prescriptions Disp Refills   levothyroxine (SYNTHROID) 88 MCG tablet [Pharmacy Med Name: Levothyroxine Sodium 88 MCG Oral Tablet] 90 tablet 1    Sig: TAKE 1 TABLET BY MOUTH ONCE DAILY BEFORE BREAKFAST     Endocrinology:  Hypothyroid Agents Passed - 09/09/2022  7:14 PM      Passed - TSH in normal range and within 360 days    TSH  Date Value Ref Range Status  02/18/2022 2.04 0.40 - 4.50 mIU/L Final         Passed - Valid encounter within last 12 months    Recent Outpatient Visits           1 week ago Peripheral edema   [place] [facility] [place], Salvadore Oxford, NP   4 months ago Chemotherapy-induced neutropenia [facility])   [place] [facility] [place], Salvadore Oxford, NP   6 months ago Postmenopausal vaginal bleeding   [place] [facility] [place], Salvadore Oxford, NP   6 months ago Abscessed tooth   [place] [facility] [place], Salvadore Oxford, NP   1 year ago Encounter for general adult medical examination with abnormal findings   [place] [facility] [place], Salvadore Oxford, NP       Future Appointments             In 3 weeks [place], Salvadore Oxford, NP  [facility]

## 2022-09-15 ENCOUNTER — Encounter: Payer: BC Managed Care – PPO | Admitting: Physical Therapy

## 2022-09-22 ENCOUNTER — Encounter: Payer: BC Managed Care – PPO | Admitting: Physical Therapy

## 2022-09-22 DIAGNOSIS — Z713 Dietary counseling and surveillance: Secondary | ICD-10-CM | POA: Diagnosis not present

## 2022-09-23 ENCOUNTER — Other Ambulatory Visit: Payer: Self-pay | Admitting: Internal Medicine

## 2022-09-24 MED ORDER — TRAMADOL HCL 50 MG PO TABS: 50.0000 mg | ORAL_TABLET | Freq: Every day | ORAL | 0 refills | Status: DC | PRN

## 2022-10-06 ENCOUNTER — Encounter: Payer: Self-pay | Admitting: Internal Medicine

## 2022-10-06 ENCOUNTER — Ambulatory Visit (INDEPENDENT_AMBULATORY_CARE_PROVIDER_SITE_OTHER): Payer: BC Managed Care – PPO | Admitting: Internal Medicine

## 2022-10-06 VITALS — BP 114/78 | HR 83 | Temp 96.9°F | Ht 63.0 in | Wt 183.0 lb

## 2022-10-06 DIAGNOSIS — E6609 Other obesity due to excess calories: Secondary | ICD-10-CM

## 2022-10-06 DIAGNOSIS — E785 Hyperlipidemia, unspecified: Secondary | ICD-10-CM | POA: Diagnosis not present

## 2022-10-06 DIAGNOSIS — R7309 Other abnormal glucose: Secondary | ICD-10-CM | POA: Diagnosis not present

## 2022-10-06 DIAGNOSIS — Z0001 Encounter for general adult medical examination with abnormal findings: Secondary | ICD-10-CM

## 2022-10-06 DIAGNOSIS — E039 Hypothyroidism, unspecified: Secondary | ICD-10-CM

## 2022-10-06 DIAGNOSIS — Z6832 Body mass index (BMI) 32.0-32.9, adult: Secondary | ICD-10-CM

## 2022-10-06 DIAGNOSIS — Z780 Asymptomatic menopausal state: Secondary | ICD-10-CM

## 2022-10-06 LAB — CBC
RBC: 3.75 10*6/uL — ABNORMAL LOW (ref 3.80–5.10)
RDW: 12.3 % (ref 11.0–15.0)

## 2022-10-06 NOTE — Assessment & Plan Note (Signed)
Encouraged diet and exercise for weight loss ?

## 2022-10-06 NOTE — Patient Instructions (Signed)
Health Maintenance for Postmenopausal Women Menopause is a normal process in which your ability to get pregnant comes to an end. This process happens slowly over many months or years, usually between the ages of 48 and 55. Menopause is complete when you have missed your menstrual period for 12 months. It is important to talk with your health care provider about some of the most common conditions that affect women after menopause (postmenopausal women). These include heart disease, cancer, and bone loss (osteoporosis). Adopting a healthy lifestyle and getting preventive care can help to promote your health and wellness. The actions you take can also lower your chances of developing some of these common conditions. What are the signs and symptoms of menopause? During menopause, you may have the following symptoms: Hot flashes. These can be moderate or severe. Night sweats. Decrease in sex drive. Mood swings. Headaches. Tiredness (fatigue). Irritability. Memory problems. Problems falling asleep or staying asleep. Talk with your health care provider about treatment options for your symptoms. Do I need hormone replacement therapy? Hormone replacement therapy is effective in treating symptoms that are caused by menopause, such as hot flashes and night sweats. Hormone replacement carries certain risks, especially as you become older. If you are thinking about using estrogen or estrogen with progestin, discuss the benefits and risks with your health care provider. How can I reduce my risk for heart disease and stroke? The risk of heart disease, heart attack, and stroke increases as you age. One of the causes may be a change in the body's hormones during menopause. This can affect how your body uses dietary fats, triglycerides, and cholesterol. Heart attack and stroke are medical emergencies. There are many things that you can do to help prevent heart disease and stroke. Watch your blood pressure High  blood pressure causes heart disease and increases the risk of stroke. This is more likely to develop in people who have high blood pressure readings or are overweight. Have your blood pressure checked: Every 3-5 years if you are 18-39 years of age. Every year if you are 40 years old or older. Eat a healthy diet  Eat a diet that includes plenty of vegetables, fruits, low-fat dairy products, and lean protein. Do not eat a lot of foods that are high in solid fats, added sugars, or sodium. Get regular exercise Get regular exercise. This is one of the most important things you can do for your health. Most adults should: Try to exercise for at least 150 minutes each week. The exercise should increase your heart rate and make you sweat (moderate-intensity exercise). Try to do strengthening exercises at least twice each week. Do these in addition to the moderate-intensity exercise. Spend less time sitting. Even light physical activity can be beneficial. Other tips Work with your health care provider to achieve or maintain a healthy weight. Do not use any products that contain nicotine or tobacco. These products include cigarettes, chewing tobacco, and vaping devices, such as e-cigarettes. If you need help quitting, ask your health care provider. Know your numbers. Ask your health care provider to check your cholesterol and your blood sugar (glucose). Continue to have your blood tested as directed by your health care provider. Do I need screening for cancer? Depending on your health history and family history, you may need to have cancer screenings at different stages of your life. This may include screening for: Breast cancer. Cervical cancer. Lung cancer. Colorectal cancer. What is my risk for osteoporosis? After menopause, you may be   at increased risk for osteoporosis. Osteoporosis is a condition in which bone destruction happens more quickly than new bone creation. To help prevent osteoporosis or  the bone fractures that can happen because of osteoporosis, you may take the following actions: If you are 19-50 years old, get at least 1,000 mg of calcium and at least 600 international units (IU) of vitamin D per day. If you are older than age 50 but younger than age 70, get at least 1,200 mg of calcium and at least 600 international units (IU) of vitamin D per day. If you are older than age 70, get at least 1,200 mg of calcium and at least 800 international units (IU) of vitamin D per day. Smoking and drinking excessive alcohol increase the risk of osteoporosis. Eat foods that are rich in calcium and vitamin D, and do weight-bearing exercises several times each week as directed by your health care provider. How does menopause affect my mental health? Depression may occur at any age, but it is more common as you become older. Common symptoms of depression include: Feeling depressed. Changes in sleep patterns. Changes in appetite or eating patterns. Feeling an overall lack of motivation or enjoyment of activities that you previously enjoyed. Frequent crying spells. Talk with your health care provider if you think that you are experiencing any of these symptoms. General instructions See your health care provider for regular wellness exams and vaccines. This may include: Scheduling regular health, dental, and eye exams. Getting and maintaining your vaccines. These include: Influenza vaccine. Get this vaccine each year before the flu season begins. Pneumonia vaccine. Shingles vaccine. Tetanus, diphtheria, and pertussis (Tdap) booster vaccine. Your health care provider may also recommend other immunizations. Tell your health care provider if you have ever been abused or do not feel safe at home. Summary Menopause is a normal process in which your ability to get pregnant comes to an end. This condition causes hot flashes, night sweats, decreased interest in sex, mood swings, headaches, or lack  of sleep. Treatment for this condition may include hormone replacement therapy. Take actions to keep yourself healthy, including exercising regularly, eating a healthy diet, watching your weight, and checking your blood pressure and blood sugar levels. Get screened for cancer and depression. Make sure that you are up to date with all your vaccines. This information is not intended to replace advice given to you by your health care provider. Make sure you discuss any questions you have with your health care provider. Document Revised: 09/09/2020 Document Reviewed: 09/09/2020 Elsevier Patient Education  2024 Elsevier Inc.  

## 2022-10-06 NOTE — Progress Notes (Signed)
Subjective:    Patient ID: Brenda Grimes, female    DOB: 1965/12/26, 57 y.o.   MRN: [id number]  HPI  Patient presents to clinic today for her annual exam.  Flu: 02/2021 Tetanus: 09/2021 COVID: Moderna x 2 Shingrix: Never Pap smear: 02/2021 Mammogram: 02/2019 Bone density: Never Colon screening: 04/2021 Vision screening: annually Dentist: biannually  Diet: She does eat meat. She consumes fruits and veggies. She does eat some fried foods. She drinks mostly Sprite. Exercise: None   Review of Systems     Past Medical History:  Diagnosis Date   anal cancer    anal cancer , underwent radiation and chemo   Arthritis    CAD (coronary artery disease)    s/p stenting x 2   Colon polyp    Depression    Hyperlipidemia    MI (myocardial infarction) (HCC)    Thyroid disease    Vitamin D deficiency     Current Outpatient Medications  Medication Sig Dispense Refill   acetaminophen (TYLENOL) 500 MG tablet Take 1,000 mg by mouth every 6 (six) hours as needed.     aspirin EC 81 MG tablet Take 1 tablet (81 mg total) by mouth daily. Swallow whole. 30 tablet 12   buPROPion (WELLBUTRIN XL) 150 MG 24 hr tablet Take 1 tablet (150 mg total) by mouth daily. 90 tablet 1   cyclobenzaprine (FLEXERIL) 10 MG tablet Take 1 tablet by oral route at bedtime. 30 tablet 0   gabapentin (NEURONTIN) 300 MG capsule TAKE 2 CAPSULES BY MOUTH ONCE DAILY AND 3 NIGHTLY     hydrochlorothiazide (MICROZIDE) 12.5 MG capsule Take 1 capsule (12.5 mg total) by mouth daily as needed. 30 capsule 0   ibuprofen (ADVIL) 200 MG tablet Take 200 mg by mouth every 6 (six) hours as needed.     levothyroxine (SYNTHROID) 88 MCG tablet TAKE 1 TABLET BY MOUTH ONCE DAILY BEFORE BREAKFAST 90 tablet 1   loperamide (IMODIUM) 2 MG capsule Take 2 mg by mouth as needed for diarrhea or loose stools.     nitroGLYCERIN (NITROSTAT) 0.4 MG SL tablet Place under the tongue.     omeprazole (PRILOSEC OTC) 20 MG tablet Take 1 tablet (20 mg  total) by mouth daily.     ondansetron (ZOFRAN) 4 MG tablet Take 1 tablet (4 mg total) by mouth every 8 (eight) hours as needed for nausea or vomiting. 20 tablet 0   Ospemifene 60 MG TABS Take 1 tablet (60 mg total) by mouth daily. 30 tablet 2   traMADol (ULTRAM) 50 MG tablet Take 1 tablet (50 mg total) by mouth daily as needed. 30 tablet 0   venlafaxine XR (EFFEXOR XR) 37.5 MG 24 hr capsule Take 1 capsule (37.5 mg total) by mouth daily with breakfast. 30 capsule 5   No current facility-administered medications for this visit.    Allergies  Allergen Reactions   Sulfa Antibiotics Rash   Sulfasalazine Rash   Tape Rash    Family History  Problem Relation Age of Onset   Alzheimer's disease Mother    CAD Father    Thyroid disease Brother    Hyperlipidemia Brother    Hyperlipidemia Brother    Alcohol abuse Brother    Thyroid disease Brother    Cirrhosis Brother    Alzheimer's disease Maternal Grandmother    Heart disease Paternal Grandmother    Heart disease Paternal Actor    Healthy Daughter    Healthy Son    Healthy Son  Heart disease Paternal Uncle    Breast cancer Cousin    Cancer Paternal Aunt        Possible colon cancer    Social History   Socioeconomic History   Marital status: Married    Spouse name: Not on file   Number of children: Not on file   Years of education: Not on file   Highest education level: Not on file  Occupational History   Not on file  Tobacco Use   Smoking status: Former    Packs/day: 0.75    Years: 30.00    Additional pack years: 0.00    Total pack years: 22.50    Types: Cigarettes    Quit date: 01/21/2007    Years since quitting: 15.7   Smokeless tobacco: Never  Vaping Use   Vaping Use: Never used  Substance and Sexual Activity   Alcohol use: No   Drug use: No   Sexual activity: Not Currently    Birth control/protection: None  Other Topics Concern   Not on file  Social History Narrative   Not on file   Social  Determinants of Health   Financial Resource Strain: Low Risk  (12/05/2021)   Overall Financial Resource Strain (CARDIA)    Difficulty of Paying Living Expenses: Not hard at all  Food Insecurity: No Food Insecurity (12/05/2021)   Hunger Vital Sign    Worried About Running Out of Food in the Last Year: Never true    Ran Out of Food in the Last Year: Never true  Transportation Needs: No Transportation Needs (12/05/2021)   PRAPARE - Administrator, Civil Service (Medical): No    Lack of Transportation (Non-Medical): No  Physical Activity: Insufficiently Active (12/05/2021)   Exercise Vital Sign    Days of Exercise per Week: 3 days    Minutes of Exercise per Session: 30 min  Stress: No Stress Concern Present (12/05/2021)   [employer] of Occupational Health - Occupational Stress Questionnaire    Feeling of Stress : Not at all  Social Connections: Moderately Isolated (12/05/2021)   Social Connection and Isolation Panel [NHANES]    Frequency of Communication with Friends and Family: More than three times a week    Frequency of Social Gatherings with Friends and Family: Three times a week    Attends Religious Services: Never    Active Member of Clubs or Organizations: No    Attends Banker Meetings: Never    Marital Status: Married  Catering manager Violence: Not At Risk (12/05/2021)   Humiliation, Afraid, Rape, and Kick questionnaire    Fear of Current or Ex-Partner: No    Emotionally Abused: No    Physically Abused: No    Sexually Abused: No     Constitutional: Denies fever, malaise, fatigue, headache or abrupt weight changes.  HEENT: Denies eye pain, eye redness, ear pain, ringing in the ears, wax buildup, runny nose, nasal congestion, bloody nose, or sore throat. Respiratory: Denies difficulty breathing, shortness of breath, cough or sputum production.   Cardiovascular: Denies chest pain, chest tightness, palpitations or swelling in the hands or feet.   Gastrointestinal: Patient reports diarrhea.  Denies abdominal pain, bloating, constipation,  or blood in the stool.  GU: Denies urgency, frequency, pain with urination, burning sensation, blood in urine, odor or discharge. Musculoskeletal: Patient reports chronic back pain, joint pain in hands.  Denies decrease in range of motion, difficulty with gait, or joint swelling.  Skin: Denies redness, rashes, lesions or  ulcercations.  Neurological: Patient reports hot flashes.  Denies dizziness, difficulty with memory, difficulty with speech or problems with balance and coordination.  Psych: Patient has a history of depression.  Denies anxiety, SI/HI.  No other specific complaints in a complete review of systems (except as listed in HPI above).  Objective:   Physical Exam   BP 114/78 (BP Location: Left Arm, Patient Position: Sitting, Cuff Size: Large)   Pulse 83   Temp (!) 96.9 F (36.1 C) (Temporal)   Ht 5\' 3"  (1.6 m)   Wt 183 lb (83 kg)   SpO2 97%   BMI 32.42 kg/m   Wt Readings from Last 3 Encounters:  09/02/22 184 lb (83.5 kg)  06/17/22 182 lb (82.6 kg)  04/14/22 182 lb (82.6 kg)    General: Appears her stated age, obese, in NAD. Skin: Warm, dry and intact.  HEENT: Head: normal shape and size; Eyes: sclera white, no icterus, conjunctiva pink, PERRLA and EOMs intact;  Neck:  Neck supple, trachea midline. No masses, lumps present.  Cardiovascular: Normal rate and rhythm. S1,S2 noted.  No murmur, rubs or gallops noted. No JVD or BLE edema. No carotid bruits noted. Pulmonary/Chest: Normal effort and positive vesicular breath sounds. No respiratory distress. No wheezes, rales or ronchi noted.  Abdomen: Normal bowel sounds.  Musculoskeletal: Enlargement of the 1st and 2nd knuckles bilateral hands. Strength 5/5 BUE/BLE. No difficulty with gait.  Neurological: Alert and oriented. Cranial nerves II-XII grossly intact. Coordination normal.  Psychiatric: Mood and affect normal. Behavior is  normal. Judgment and thought content normal.     BMET    Component Value Date/Time   NA 143 04/14/2022 0909   K 4.0 04/14/2022 0909   CL 108 04/14/2022 0909   CO2 28 04/14/2022 0909   GLUCOSE 112 (H) 04/14/2022 0909   BUN 16 04/14/2022 0909   CREATININE 0.73 04/14/2022 0909   CALCIUM 9.2 04/14/2022 0909   GFRNONAA >60 08/08/2021 1509   GFRNONAA 79 04/04/2020 0904   GFRAA 91 04/04/2020 0904    Lipid Panel     Component Value Date/Time   CHOL 195 04/14/2022 0909   TRIG 134 04/14/2022 0909   HDL 62 04/14/2022 0909   CHOLHDL 3.1 04/14/2022 0909   LDLCALC 108 (H) 04/14/2022 0909    CBC    Component Value Date/Time   WBC 2.8 (L) 04/14/2022 0909   RBC 3.67 (L) 04/14/2022 0909   HGB 12.3 04/14/2022 0909   HCT 36.0 04/14/2022 0909   PLT 212 04/14/2022 0909   MCV 98.1 04/14/2022 0909   MCH 33.5 (H) 04/14/2022 0909   MCHC 34.2 04/14/2022 0909   RDW 12.5 04/14/2022 0909   LYMPHSABS 921 07/24/2021 1010   MONOABS 0.3 04/15/2020 1209   EOSABS 60 07/24/2021 1010   BASOSABS 39 07/24/2021 1010    Hgb A1C Lab Results  Component Value Date   HGBA1C 5.2 09/09/2021           Assessment & Plan:   Preventative Health Maintenance:  Encouraged her to get a flu shot in the fall Tetanus UTD Encouraged her to get her COVID booster Discussed Shingrix vaccine, she will check coverage with her insurance company and schedule visit if she would like to have this done Pap smear UTD Mammogram and bone density ordered-she will call to schedule Colon screening UTD Encouraged her to consume a balanced diet and exercise regimen Advised her to see an eye doctor and dentist annually We will check CBC, c-Met, TSH, free T4,  lipid, A1c today  RTC in 6 months for follow-up of chronic conditions Nicki Reaper, NP

## 2022-10-07 ENCOUNTER — Encounter: Payer: Self-pay | Admitting: Internal Medicine

## 2022-10-07 DIAGNOSIS — E785 Hyperlipidemia, unspecified: Secondary | ICD-10-CM

## 2022-10-07 LAB — COMPLETE METABOLIC PANEL WITH GFR
AG Ratio: 2.4 (calc) (ref 1.0–2.5)
ALT: 12 U/L (ref 6–29)
AST: 15 U/L (ref 10–35)
Albumin: 4.3 g/dL (ref 3.6–5.1)
Alkaline phosphatase (APISO): 88 U/L (ref 37–153)
BUN: 16 mg/dL (ref 7–25)
CO2: 27 mmol/L (ref 20–32)
Calcium: 8.6 mg/dL (ref 8.6–10.4)
Chloride: 108 mmol/L (ref 98–110)
Creat: 0.73 mg/dL (ref 0.50–1.03)
Globulin: 1.8 g/dL (calc) — ABNORMAL LOW (ref 1.9–3.7)
Glucose, Bld: 97 mg/dL (ref 65–99)
Potassium: 4.3 mmol/L (ref 3.5–5.3)
Sodium: 142 mmol/L (ref 135–146)
Total Bilirubin: 0.6 mg/dL (ref 0.2–1.2)
Total Protein: 6.1 g/dL (ref 6.1–8.1)
eGFR: 95 mL/min/{1.73_m2} (ref >=60)

## 2022-10-07 LAB — CBC
HCT: 37.7 % (ref 35.0–45.0)
Hemoglobin: 12.4 g/dL (ref 11.7–15.5)
MCH: 33.1 pg — ABNORMAL HIGH (ref 27.0–33.0)
MCHC: 32.9 g/dL (ref 32.0–36.0)
MCV: 100.5 fL — ABNORMAL HIGH (ref 80.0–100.0)
MPV: 9.3 fL (ref 7.5–12.5)
Platelets: 197 10*3/uL (ref 140–400)
WBC: 3.1 10*3/uL — ABNORMAL LOW (ref 3.8–10.8)

## 2022-10-07 LAB — LIPID PANEL
Cholesterol: 218 mg/dL — ABNORMAL HIGH (ref <200)
HDL: 65 mg/dL (ref >=50)
LDL Cholesterol (Calc): 130 mg/dL (calc) — ABNORMAL HIGH
Non-HDL Cholesterol (Calc): 153 mg/dL (calc) — ABNORMAL HIGH (ref <130)
Total CHOL/HDL Ratio: 3.4 (calc) (ref <5.0)
Triglycerides: 118 mg/dL (ref <150)

## 2022-10-07 LAB — TSH: TSH: 1.8 mIU/L (ref 0.40–4.50)

## 2022-10-07 LAB — HEMOGLOBIN A1C
Hgb A1c MFr Bld: 5.4 % of total Hgb (ref <5.7)
Mean Plasma Glucose: 108 mg/dL
eAG (mmol/L): 6 mmol/L

## 2022-10-07 LAB — T4, FREE: Free T4: 1.1 ng/dL (ref 0.8–1.8)

## 2022-10-07 MED ORDER — SIMVASTATIN 10 MG PO TABS: 10.0000 mg | ORAL_TABLET | Freq: Every day | ORAL | 1 refills | Status: AC

## 2022-10-08 ENCOUNTER — Encounter: Payer: Self-pay | Admitting: Internal Medicine

## 2022-10-28 DIAGNOSIS — Z01419 Encounter for gynecological examination (general) (routine) without abnormal findings: Secondary | ICD-10-CM | POA: Diagnosis not present

## 2022-10-28 DIAGNOSIS — Z1211 Encounter for screening for malignant neoplasm of colon: Secondary | ICD-10-CM | POA: Diagnosis not present

## 2022-10-28 DIAGNOSIS — R32 Unspecified urinary incontinence: Secondary | ICD-10-CM | POA: Diagnosis not present

## 2022-10-28 DIAGNOSIS — Z1272 Encounter for screening for malignant neoplasm of vagina: Secondary | ICD-10-CM | POA: Diagnosis not present

## 2022-10-31 ENCOUNTER — Other Ambulatory Visit: Payer: Self-pay | Admitting: Internal Medicine

## 2022-11-02 ENCOUNTER — Other Ambulatory Visit: Payer: Self-pay | Admitting: Internal Medicine

## 2022-11-02 MED ORDER — TRAMADOL HCL 50 MG PO TABS: 50.0000 mg | ORAL_TABLET | Freq: Every day | ORAL | 0 refills | Status: DC | PRN

## 2022-11-02 NOTE — Telephone Encounter (Signed)
Requested Prescriptions  Pending Prescriptions Disp Refills   venlafaxine XR (EFFEXOR-XR) 37.5 MG 24 hr capsule [Pharmacy Med Name: Venlafaxine HCl ER 37.5 MG Oral Capsule Extended Release 24 Hour] 30 capsule 5    Sig: TAKE 1 CAPSULE BY MOUTH ONCE DAILY WITH BREAKFAST     Psychiatry: Antidepressants - SNRI - desvenlafaxine & venlafaxine Failed - 10/31/2022  9:20 AM      Failed - Lipid Panel in normal range within the last 12 months    Cholesterol  Date Value Ref Range Status  10/06/2022 218 (H) <200 mg/dL Final   LDL Cholesterol (Calc)  Date Value Ref Range Status  10/06/2022 130 (H) mg/dL (calc) Final    Comment:    Reference range: <100 . Desirable range <100 mg/dL for primary prevention;   <70 mg/dL for patients with CHD or diabetic patients  with > or = 2 CHD risk factors. Marland Kitchen LDL-C is now calculated using the Martin-Hopkins  calculation, which is a validated novel method providing  better accuracy than the Friedewald equation in the  estimation of LDL-C.  Horald Pollen et al. Lenox Ahr. 9629;528(41): 2061-2068  (http://education.[facility].com/faq/FAQ164)    HDL  Date Value Ref Range Status  10/06/2022 65 > OR = 50 mg/dL Final   Triglycerides  Date Value Ref Range Status  10/06/2022 118 <150 mg/dL Final         Passed - Cr in normal range and within 360 days    Creat  Date Value Ref Range Status  10/06/2022 0.73 0.50 - 1.03 mg/dL Final         Passed - Completed PHQ-2 or PHQ-9 in the last 360 days      Passed - Last BP in normal range    BP Readings from Last 1 Encounters:  10/06/22 114/78         Passed - Valid encounter within last 6 months    Recent Outpatient Visits           3 weeks ago Encounter for general adult medical examination with abnormal findings   [place] [facility] [place], Salvadore Oxford, NP   2 months ago Peripheral edema   [place] [facility] [place], Salvadore Oxford, NP   6 months ago Chemotherapy-induced  neutropenia [facility])   [place] [facility] [place], Salvadore Oxford, NP   7 months ago Postmenopausal vaginal bleeding   [place] [facility] [place], Salvadore Oxford, NP   8 months ago Abscessed tooth    [facility] [place], Salvadore Oxford, NP       Future Appointments             In 5 months [place], Salvadore Oxford, NP  [facility]

## 2022-11-09 ENCOUNTER — Encounter: Payer: Self-pay | Admitting: Internal Medicine

## 2022-11-10 ENCOUNTER — Encounter: Payer: Self-pay | Admitting: Internal Medicine

## 2022-11-11 ENCOUNTER — Ambulatory Visit
Admission: RE | Admit: 2022-11-11 | Discharge: 2022-11-11 | Disposition: A | Discharge status: Home / Self Care | Payer: BC Managed Care – PPO | Attending: Internal Medicine | Admitting: Internal Medicine

## 2022-11-11 ENCOUNTER — Encounter: Payer: Self-pay | Admitting: Internal Medicine

## 2022-11-11 ENCOUNTER — Ambulatory Visit
Admission: RE | Admit: 2022-11-11 | Discharge: 2022-11-11 | Disposition: A | Discharge status: Home / Self Care | Payer: BC Managed Care – PPO | Source: Ambulatory Visit | Attending: Internal Medicine | Admitting: Internal Medicine

## 2022-11-11 ENCOUNTER — Ambulatory Visit: Payer: Self-pay | Admitting: *Deleted

## 2022-11-11 ENCOUNTER — Ambulatory Visit (INDEPENDENT_AMBULATORY_CARE_PROVIDER_SITE_OTHER): Payer: BC Managed Care – PPO | Admitting: Internal Medicine

## 2022-11-11 VITALS — BP 134/86 | HR 86 | Temp 97.1°F | Wt 184.0 lb

## 2022-11-11 DIAGNOSIS — R0602 Shortness of breath: Secondary | ICD-10-CM

## 2022-11-11 DIAGNOSIS — M542 Cervicalgia: Secondary | ICD-10-CM

## 2022-11-11 DIAGNOSIS — R42 Dizziness and giddiness: Secondary | ICD-10-CM

## 2022-11-11 DIAGNOSIS — R059 Cough, unspecified: Secondary | ICD-10-CM | POA: Diagnosis not present

## 2022-11-11 DIAGNOSIS — N3000 Acute cystitis without hematuria: Secondary | ICD-10-CM | POA: Diagnosis not present

## 2022-11-11 DIAGNOSIS — R051 Acute cough: Secondary | ICD-10-CM

## 2022-11-11 LAB — POC COVID19 BINAXNOW: SARS Coronavirus 2 Ag: NEGATIVE

## 2022-11-11 MED ORDER — MECLIZINE HCL 25 MG PO TABS
25.0000 mg | ORAL_TABLET | Freq: Three times a day (TID) | ORAL | 0 refills | Status: DC | PRN
Start: 2022-11-11 — End: 2022-12-15

## 2022-11-11 NOTE — Patient Instructions (Signed)
Dizziness Dizziness is a common problem. It makes you feel unsteady or light-headed. You may feel like you are about to pass out (faint). Dizziness can lead to getting hurt if you stumble or fall. Dizziness can be caused by many things, including: Medicines. Not having enough water in your body (dehydration). Illness. Follow these instructions at home: Eating and drinking  Drink enough fluid to keep your pee (urine) pale yellow. This helps to keep you from getting dehydrated. Try to drink more clear fluids, such as water. Do not drink alcohol. Limit how much caffeine you drink or eat, if your doctor tells you to do that. Limit how much salt (sodium) you drink or eat, if your doctor tells you to do that. Activity  Avoid making quick movements. Stand up slowly from sitting in a chair, and steady yourself until you feel okay. In the morning, first sit up on the side of the bed. When you feel okay, stand up slowly while you hold onto something. Do this until you know that your balance is okay. If you need to stand in one place for a long time, move your legs often. Tighten and relax the muscles in your legs while you are standing. Do not drive or use machinery if you feel dizzy. Avoid bending down if you feel dizzy. Place items in your home so you can reach them easily without leaning over. Lifestyle Do not smoke or use any products that contain nicotine or tobacco. If you need help quitting, ask your doctor. Try to lower your stress level. You can do this by using methods such as yoga or meditation. Talk with your doctor if you need help. General instructions Watch your dizziness for any changes. Take over-the-counter and prescription medicines only as told by your doctor. Talk with your doctor if you think that you are dizzy because of a medicine that you are taking. Tell a friend or a family member that you are feeling dizzy. If he or she notices any changes in your behavior, have this  person call your doctor. Keep all follow-up visits. Contact a doctor if: Your dizziness does not go away. Your dizziness or light-headedness gets worse. You feel like you may vomit (are nauseous). You have trouble hearing. You have new symptoms. You are unsteady on your feet. You feel like the room is spinning. You have neck pain or a stiff neck. You have a fever. Get help right away if: You vomit or have watery poop (diarrhea), and you cannot eat or drink anything. You have trouble: Talking. Walking. Swallowing. Using your arms, hands, or legs. You feel generally weak. You are not thinking clearly, or you have trouble forming sentences. A friend or family member may notice this. You have: Chest pain. Pain in your belly (abdomen). Shortness of breath. Sweating. Your vision changes. You are bleeding. You have a very bad headache. These symptoms may be an emergency. Get help right away. Call your local emergency services (911 in the [place] Do not wait to see if the symptoms will go away. Do not drive yourself to the hospital. Summary Dizziness makes you feel unsteady or light-headed. You may feel like you are about to pass out (faint). Drink enough fluid to keep your pee (urine) pale yellow. Do not drink alcohol. Avoid making quick movements if you feel dizzy. Watch your dizziness for any changes. This information is not intended to replace advice given to you by your health care provider. Make sure you discuss any questions   you have with your health care provider. Document Revised: 03/22/2020 Document Reviewed: 03/25/2020 Elsevier Patient Education  2024 Elsevier Inc.  

## 2022-11-11 NOTE — Telephone Encounter (Signed)
Reason for Disposition  [1] Dizziness caused by heat exposure, sudden standing, or poor fluid intake AND [2] no improvement after 2 hours of rest and fluids  Answer Assessment - Initial Assessment Questions 1. DESCRIPTION: "Describe your dizziness."     Unable to move fast, holding onto things 2. LIGHTHEADED: "Do you feel lightheaded?" (e.g., somewhat faint, woozy, weak upon standing)     yes 3. VERTIGO: "Do you feel like either you or the room is spinning or tilting?" (i.e. vertigo)     no 4. SEVERITY: "How bad is it?"  "Do you feel like you are going to faint?" "Can you stand and walk?"   - MILD: Feels slightly dizzy, but walking normally.   - MODERATE: Feels unsteady when walking, but not falling; interferes with normal activities (e.g., school, work).   - SEVERE: Unable to walk without falling, or requires assistance to walk without falling; feels like passing out now.      moderate 5. ONSET:  "When did the dizziness begin?"     4 days ago 6. AGGRAVATING FACTORS: "Does anything make it worse?" (e.g., standing, change in head position)     Turning head, sitting to standing 7. HEART RATE: "Can you tell me your heart rate?" "How many beats in 15 seconds?"  (Note: not all patients can do this)       No change 8. CAUSE: "What do you think is causing the dizziness?"     Possible antibiotic- TH- treating UTI 9. RECURRENT SYMPTOM: "Have you had dizziness before?" If Yes, ask: "When was the last time?" "What happened that time?"     no 10. OTHER SYMPTOMS: "Do you have any other symptoms?" (e.g., fever, chest pain, vomiting, diarrhea, bleeding)       Some congestion, neck pain  Protocols used: Dizziness - Lightheadedness-A-AH

## 2022-11-11 NOTE — Telephone Encounter (Signed)
  Chief Complaint: new onset dizziness Symptoms: dizziness, nausea, neck pain, congestion  Frequency: 4 days Pertinent Negatives: Patient denies , fever, chest pain, vomiting Disposition: [] ED /[] Urgent Care (no appt availability in office) / [] Appointment(In office/virtual)/ []  [facility]/ [] Home Care/ [] Refused Recommended Disposition /[] [facility]/ []  Follow-up with PCP Additional Notes: Patient reports she is in her second round of antibiotics for UTI- she thinks she may be reacting to that- new onset dizziness- moderate- sitting to standing, turning head- makes patient faint and nauseated. Appointment has been scheduled

## 2022-11-11 NOTE — Progress Notes (Signed)
Subjective:    Patient ID: Brenda Grimes, female    DOB: 1965/10/07, 57 y.o.   MRN: [id number]  HPI  Patient presents to clinic today with complaint of fatigue and dizziness.  She reports this started 4 days ago.  She describes the dizziness as a sense of lightheadedness, not that the room is spinning.  She is having to hold onto things to walk. She reports associated neck pain. She describes the pain as stiffness, more on the right. The pain does not radiate into her shoulder or arms. She denies numbness, tingling or weakness of her upper extremities. She denies vision changes, sensitivity to light or sound, or near syncope. She has had some nausea but denies vomiting.  She reports cough and chest congestion but denies shortness of breath, or chest pain. She denies recent changes in diet but is currently on an antibiotic (Macrobid) for a UTI.  Review of Systems  Past Medical History:  Diagnosis Date   anal cancer    anal cancer , underwent radiation and chemo   Arthritis    CAD (coronary artery disease)    s/p stenting x 2   Colon polyp    Depression    Hyperlipidemia    MI (myocardial infarction) (HCC)    Thyroid disease    Vitamin D deficiency     Current Outpatient Medications  Medication Sig Dispense Refill   acetaminophen (TYLENOL) 500 MG tablet Take 1,000 mg by mouth every 6 (six) hours as needed.     aspirin EC 81 MG tablet Take 1 tablet (81 mg total) by mouth daily. Swallow whole. 30 tablet 12   buPROPion (WELLBUTRIN XL) 150 MG 24 hr tablet Take 1 tablet (150 mg total) by mouth daily. 90 tablet 1   cyclobenzaprine (FLEXERIL) 10 MG tablet Take 1 tablet by oral route at bedtime. 30 tablet 0   gabapentin (NEURONTIN) 300 MG capsule TAKE 2 CAPSULES BY MOUTH ONCE DAILY AND 3 NIGHTLY     hydrochlorothiazide (MICROZIDE) 12.5 MG capsule Take 1 capsule (12.5 mg total) by mouth daily as needed. 30 capsule 0   ibuprofen (ADVIL) 200 MG tablet Take 200 mg by mouth every 6 (six) hours as  needed.     levothyroxine (SYNTHROID) 88 MCG tablet TAKE 1 TABLET BY MOUTH ONCE DAILY BEFORE BREAKFAST 90 tablet 1   loperamide (IMODIUM) 2 MG capsule Take 2 mg by mouth as needed for diarrhea or loose stools.     nitroGLYCERIN (NITROSTAT) 0.4 MG SL tablet Place under the tongue.     omeprazole (PRILOSEC OTC) 20 MG tablet Take 1 tablet (20 mg total) by mouth daily.     ondansetron (ZOFRAN) 4 MG tablet Take 1 tablet (4 mg total) by mouth every 8 (eight) hours as needed for nausea or vomiting. 20 tablet 0   Ospemifene 60 MG TABS Take 1 tablet (60 mg total) by mouth daily. 30 tablet 2   simvastatin (ZOCOR) 10 MG tablet Take 1 tablet (10 mg total) by mouth at bedtime. 90 tablet 1   traMADol (ULTRAM) 50 MG tablet Take 1 tablet (50 mg total) by mouth daily as needed. 30 tablet 0   venlafaxine XR (EFFEXOR-XR) 37.5 MG 24 hr capsule TAKE 1 CAPSULE BY MOUTH ONCE DAILY WITH BREAKFAST 30 capsule 5   No current facility-administered medications for this visit.    Allergies  Allergen Reactions   Sulfa Antibiotics Rash   Sulfasalazine Rash   Tape Rash    Family History  Problem Relation  Age of Onset   Alzheimer's disease Mother    CAD Father    Rheum arthritis Father    Osteoporosis Father    Thyroid disease Brother    Hyperlipidemia Brother    Hyperlipidemia Brother    Alcohol abuse Brother    Thyroid disease Brother    Cirrhosis Brother    Alzheimer's disease Maternal Grandmother    Heart disease Paternal Grandmother    Heart disease Paternal Grandfather    Healthy Daughter    Healthy Son    Healthy Son    Cancer Paternal Aunt        Possible colon cancer   Heart disease Paternal Uncle    Breast cancer Cousin     Social History   Socioeconomic History   Marital status: Married    Spouse name: Not on file   Number of children: Not on file   Years of education: Not on file   Highest education level: Not on file  Occupational History   Not on file  Tobacco Use   Smoking  status: Former    Packs/day: 0.75    Years: 30.00    Additional pack years: 0.00    Total pack years: 22.50    Types: Cigarettes    Quit date: 01/21/2007    Years since quitting: 15.8   Smokeless tobacco: Never  Vaping Use   Vaping Use: Never used  Substance and Sexual Activity   Alcohol use: No   Drug use: No   Sexual activity: Not Currently    Birth control/protection: None  Other Topics Concern   Not on file  Social History Narrative   Not on file   Social Determinants of Health   Financial Resource Strain: Low Risk  (12/05/2021)   Overall Financial Resource Strain (CARDIA)    Difficulty of Paying Living Expenses: Not hard at all  Food Insecurity: No Food Insecurity (12/05/2021)   Hunger Vital Sign    Worried About Running Out of Food in the Last Year: Never true    Ran Out of Food in the Last Year: Never true  Transportation Needs: No Transportation Needs (12/05/2021)   PRAPARE - Administrator, Civil Service (Medical): No    Lack of Transportation (Non-Medical): No  Physical Activity: Insufficiently Active (12/05/2021)   Exercise Vital Sign    Days of Exercise per Week: 3 days    Minutes of Exercise per Session: 30 min  Stress: No Stress Concern Present (12/05/2021)   [employer] of Occupational Health - Occupational Stress Questionnaire    Feeling of Stress : Not at all  Social Connections: Moderately Isolated (12/05/2021)   Social Connection and Isolation Panel [NHANES]    Frequency of Communication with Friends and Family: More than three times a week    Frequency of Social Gatherings with Friends and Family: Three times a week    Attends Religious Services: Never    Active Member of Clubs or Organizations: No    Attends Banker Meetings: Never    Marital Status: Married  Catering manager Violence: Not At Risk (12/05/2021)   Humiliation, Afraid, Rape, and Kick questionnaire    Fear of Current or Ex-Partner: No    Emotionally Abused: No     Physically Abused: No    Sexually Abused: No     Constitutional: Denies fever, malaise, fatigue, headache or abrupt weight changes.  HEENT: Denies eye pain, eye redness, ear pain, ringing in the ears, wax buildup, runny nose, nasal  congestion, bloody nose, or sore throat. Respiratory: Denies difficulty breathing, shortness of breath, cough or sputum production.   Cardiovascular: Denies chest pain, chest tightness, palpitations or swelling in the hands or feet.  Gastrointestinal: Patient reports chronic diarrhea.  Denies abdominal pain, bloating, constipation, or blood in the stool.  GU: Denies urgency, frequency, pain with urination, burning sensation, blood in urine, odor or discharge. Musculoskeletal: Denies decrease in range of motion, difficulty with gait, muscle pain or joint pain and swelling.  Skin: Denies redness, rashes, lesions or ulcercations.  Neurological: Patient reports dizziness, hot flashes.  Denies difficulty with memory, difficulty with speech or problems with balance and coordination.  Psych: Patient is history of depression.  Denies anxiety, SI/HI.  No other specific complaints in a complete review of systems (except as listed in HPI above).     Objective:   Physical Exam  BP 134/86 (BP Location: Left Arm, Patient Position: Sitting, Cuff Size: Normal)   Pulse 86   Temp (!) 97.1 F (36.2 C) (Temporal)   Wt 184 lb (83.5 kg)   SpO2 96%   BMI 32.59 kg/m   Wt Readings from Last 3 Encounters:  10/06/22 183 lb (83 kg)  09/02/22 184 lb (83.5 kg)  06/17/22 182 lb (82.6 kg)    General: Appears her stated age, appears unwell but in NAD. Skin: Warm, dry and intact. No rashes noted. HEENT: Head: normal shape and size; Eyes: sclera white, no icterus, conjunctiva pink, PERRLA and EOMs intact;  Neck:  Neck supple, trachea midline. No masses, lumps or thyromegaly present.  Cardiovascular: Normal rate and rhythm. S1,S2 noted.  No murmur, rubs or gallops noted.   Pulmonary/Chest: Normal effort and positive vesicular breath sounds. No respiratory distress. No wheezes, rales or ronchi noted.  Abdomen: Soft and nontender. Normal bowel sounds.  Musculoskeletal:  No difficulty with gait.  Neurological: Alert and oriented. Cranial nerves II-XII grossly intact. Positive romberg. Coordination normal.     BMET    Component Value Date/Time   NA 142 10/06/2022 0857   K 4.3 10/06/2022 0857   CL 108 10/06/2022 0857   CO2 27 10/06/2022 0857   GLUCOSE 97 10/06/2022 0857   BUN 16 10/06/2022 0857   CREATININE 0.73 10/06/2022 0857   CALCIUM 8.6 10/06/2022 0857   GFRNONAA >60 08/08/2021 1509   GFRNONAA 79 04/04/2020 0904   GFRAA 91 04/04/2020 0904    Lipid Panel     Component Value Date/Time   CHOL 218 (H) 10/06/2022 0857   TRIG 118 10/06/2022 0857   HDL 65 10/06/2022 0857   CHOLHDL 3.4 10/06/2022 0857   LDLCALC 130 (H) 10/06/2022 0857    CBC    Component Value Date/Time   WBC 3.1 (L) 10/06/2022 0857   RBC 3.75 (L) 10/06/2022 0857   HGB 12.4 10/06/2022 0857   HCT 37.7 10/06/2022 0857   PLT 197 10/06/2022 0857   MCV 100.5 (H) 10/06/2022 0857   MCH 33.1 (H) 10/06/2022 0857   MCHC 32.9 10/06/2022 0857   RDW 12.3 10/06/2022 0857   LYMPHSABS 921 07/24/2021 1010   MONOABS 0.3 04/15/2020 1209   EOSABS 60 07/24/2021 1010   BASOSABS 39 07/24/2021 1010    Hgb A1C Lab Results  Component Value Date   HGBA1C 5.4 10/06/2022            Assessment & Plan:   Dizziness, neck pain, cough, shortness of breath, UTI:  Will send urine culture Continue Macrobid as previously prescribed Will check CBC and CMET today  Rapid covid negative Chest xray for further evaluation of symptoms RX for Meclizine 25 mg TID prn ER precautions discussed  RTC in 5 months for follow-up of chronic conditions Nicki Reaper, NP

## 2022-11-12 LAB — CBC
HCT: 36.3 % (ref 35.0–45.0)
Hemoglobin: 12 g/dL (ref 11.7–15.5)
MCH: 33 pg (ref 27.0–33.0)
MCHC: 33.1 g/dL (ref 32.0–36.0)
MCV: 99.7 fL (ref 80.0–100.0)
MPV: 9.4 fL (ref 7.5–12.5)
Platelets: 181 10*3/uL (ref 140–400)
RBC: 3.64 10*6/uL — ABNORMAL LOW (ref 3.80–5.10)
RDW: 12.1 % (ref 11.0–15.0)
WBC: 3.5 10*3/uL — ABNORMAL LOW (ref 3.8–10.8)

## 2022-11-12 LAB — COMPLETE METABOLIC PANEL WITH GFR
AG Ratio: 2 (calc) (ref 1.0–2.5)
ALT: 16 U/L (ref 6–29)
AST: 19 U/L (ref 10–35)
Albumin: 4.3 g/dL (ref 3.6–5.1)
Alkaline phosphatase (APISO): 94 U/L (ref 37–153)
BUN: 13 mg/dL (ref 7–25)
CO2: 28 mmol/L (ref 20–32)
Calcium: 9.1 mg/dL (ref 8.6–10.4)
Chloride: 105 mmol/L (ref 98–110)
Creat: 0.65 mg/dL (ref 0.50–1.03)
Globulin: 2.1 g/dL (calc) (ref 1.9–3.7)
Glucose, Bld: 83 mg/dL (ref 65–139)
Potassium: 4.4 mmol/L (ref 3.5–5.3)
Sodium: 141 mmol/L (ref 135–146)
Total Bilirubin: 0.4 mg/dL (ref 0.2–1.2)
Total Protein: 6.4 g/dL (ref 6.1–8.1)
eGFR: 102 mL/min/{1.73_m2} (ref >=60)

## 2022-11-12 LAB — URINE CULTURE
MICRO NUMBER:: 15183305
SPECIMEN QUALITY:: ADEQUATE

## 2022-11-27 DIAGNOSIS — Z713 Dietary counseling and surveillance: Secondary | ICD-10-CM | POA: Diagnosis not present

## 2022-12-06 ENCOUNTER — Other Ambulatory Visit: Payer: Self-pay | Admitting: Internal Medicine

## 2022-12-06 DIAGNOSIS — F339 Major depressive disorder, recurrent, unspecified: Secondary | ICD-10-CM

## 2022-12-07 NOTE — Telephone Encounter (Signed)
Requested Prescriptions  Pending Prescriptions Disp Refills   buPROPion (WELLBUTRIN XL) 150 MG 24 hr tablet [Pharmacy Med Name: buPROPion HCl ER (XL) 150 MG Oral Tablet Extended Release 24 Hour] 90 tablet 1    Sig: Take 1 tablet by mouth once daily     Psychiatry: Antidepressants - bupropion Passed - 12/06/2022  7:50 AM      Passed - Cr in normal range and within 360 days    Creat  Date Value Ref Range Status  11/11/2022 0.65 0.50 - 1.03 mg/dL Final         Passed - AST in normal range and within 360 days    AST  Date Value Ref Range Status  11/11/2022 19 10 - 35 U/L Final         Passed - ALT in normal range and within 360 days    ALT  Date Value Ref Range Status  11/11/2022 16 6 - 29 U/L Final         Passed - Completed PHQ-2 or PHQ-9 in the last 360 days      Passed - Last BP in normal range    BP Readings from Last 1 Encounters:  11/11/22 134/86         Passed - Valid encounter within last 6 months    Recent Outpatient Visits           3 weeks ago Acute cough   [place] [facility] [place], Salvadore Oxford, NP   2 months ago Encounter for general adult medical examination with abnormal findings   [place] [facility] [place], Salvadore Oxford, NP   3 months ago Peripheral edema   [place] [facility] [place], Salvadore Oxford, NP   7 months ago Chemotherapy-induced neutropenia [facility])   [place] [facility] [place], Salvadore Oxford, NP   8 months ago Postmenopausal vaginal bleeding   [place] [facility] [place], Salvadore Oxford, NP       Future Appointments             In 4 months Ba[place], Salvadore Oxford, NP [facility]

## 2022-12-11 ENCOUNTER — Ambulatory Visit (INDEPENDENT_AMBULATORY_CARE_PROVIDER_SITE_OTHER): Payer: BC Managed Care – PPO

## 2022-12-11 ENCOUNTER — Other Ambulatory Visit: Payer: Self-pay | Admitting: Internal Medicine

## 2022-12-11 DIAGNOSIS — Z0000 Encounter for general adult medical examination without abnormal findings: Secondary | ICD-10-CM

## 2022-12-11 DIAGNOSIS — Z1231 Encounter for screening mammogram for malignant neoplasm of breast: Secondary | ICD-10-CM

## 2022-12-11 MED ORDER — TRAMADOL HCL 50 MG PO TABS: 50.0000 mg | ORAL_TABLET | Freq: Every day | ORAL | 0 refills | Status: DC | PRN

## 2022-12-11 NOTE — Patient Instructions (Addendum)
Brenda Grimes , Thank you for taking time to come for your Medicare Wellness Visit. I appreciate your ongoing commitment to your health goals. Please review the following plan we discussed and let me know if I can assist you in the future.   Referrals/Orders/Follow-Ups/Clinician Recommendations: mammogram referral sent  This is a list of the screening recommended for you and due dates:  Health Maintenance  Topic Date Due   Zoster (Shingles) Vaccine (1 of 2) Never done   Mammogram  02/08/2021   Flu Shot  12/03/2022   Colon Cancer Screening  04/17/2028   DTaP/Tdap/Td vaccine (2 - Td or Tdap) 09/10/2031   Hepatitis C Screening  Completed   HIV Screening  Completed   HPV Vaccine  Aged Out   Screening for Lung Cancer  Discontinued   Pap Smear  Discontinued   COVID-19 Vaccine  Discontinued    Advanced directives: (ACP Link)Information on Advanced Care Planning can be found at [facility] Advance Health Care Directives Advance Health Care Directives ([email or web address]   Next Medicare Annual Wellness Visit scheduled for next year: Yes    12/16/23 @ 3:00 pm by phone  Preventive Care 40-64 Years, Female Preventive care refers to lifestyle choices and visits with your health care provider that can promote health and wellness. What does preventive care include? A yearly physical exam. This is also called an annual well check. Dental exams once or twice a year. Routine eye exams. Ask your health care provider how often you should have your eyes checked. Personal lifestyle choices, including: Daily care of your teeth and gums. Regular physical activity. Eating a healthy diet. Avoiding tobacco and drug use. Limiting alcohol use. Practicing safe sex. Taking low-dose aspirin daily starting at age 77. Taking vitamin and mineral supplements as recommended by your health care provider. What happens during an annual well check? The services and screenings done by your health care  provider during your annual well check will depend on your age, overall health, lifestyle risk factors, and family history of disease. Counseling  Your health care provider may ask you questions about your: Alcohol use. Tobacco use. Drug use. Emotional well-being. Home and relationship well-being. Sexual activity. Eating habits. Work and work Astronomer. Method of birth control. Menstrual cycle. Pregnancy history. Screening  You may have the following tests or measurements: Height, weight, and BMI. Blood pressure. Lipid and cholesterol levels. These may be checked every 5 years, or more frequently if you are over 30 years old. Skin check. Lung cancer screening. You may have this screening every year starting at age 89 if you have a 30-pack-year history of smoking and currently smoke or have quit within the past 15 years. Fecal occult blood test (FOBT) of the stool. You may have this test every year starting at age 16. Flexible sigmoidoscopy or colonoscopy. You may have a sigmoidoscopy every 5 years or a colonoscopy every 10 years starting at age 29. Hepatitis C blood test. Hepatitis B blood test. Sexually transmitted disease (STD) testing. Diabetes screening. This is done by checking your blood sugar (glucose) after you have not eaten for a while (fasting). You may have this done every 1-3 years. Mammogram. This may be done every 1-2 years. Talk to your health care provider about when you should start having regular mammograms. This may depend on whether you have a family history of breast cancer. BRCA-related cancer screening. This may be done if you have a family history of breast, ovarian, tubal, or peritoneal cancers.  Pelvic exam and Pap test. This may be done every 3 years starting at age 8. Starting at age 25, this may be done every 5 years if you have a Pap test in combination with an HPV test. Bone density scan. This is done to screen for osteoporosis. You may have this scan  if you are at high risk for osteoporosis. Discuss your test results, treatment options, and if necessary, the need for more tests with your health care provider. Vaccines  Your health care provider may recommend certain vaccines, such as: Influenza vaccine. This is recommended every year. Tetanus, diphtheria, and acellular pertussis (Tdap, Td) vaccine. You may need a Td booster every 10 years. Zoster vaccine. You may need this after age 23. Pneumococcal 13-valent conjugate (PCV13) vaccine. You may need this if you have certain conditions and were not previously vaccinated. Pneumococcal polysaccharide (PPSV23) vaccine. You may need one or two doses if you smoke cigarettes or if you have certain conditions. Talk to your health care provider about which screenings and vaccines you need and how often you need them. This information is not intended to replace advice given to you by your health care provider. Make sure you discuss any questions you have with your health care provider. Document Released: 05/17/2015 Document Revised: 01/08/2016 Document Reviewed: 02/19/2015 Elsevier Interactive Patient Education  2017 [employer].    Fall Prevention in the Home Falls can cause injuries. They can happen to people of all ages. There are many things you can do to make your home safe and to help prevent falls. What can I do on the outside of my home? Regularly fix the edges of walkways and driveways and fix any cracks. Remove anything that might make you trip as you walk through a door, such as a raised step or threshold. Trim any bushes or trees on the path to your home. Use bright outdoor lighting. Clear any walking paths of anything that might make someone trip, such as rocks or tools. Regularly check to see if handrails are loose or broken. Make sure that both sides of any steps have handrails. Any raised decks and porches should have guardrails on the edges. Have any leaves, snow, or ice cleared  regularly. Use sand or salt on walking paths during winter. Clean up any spills in your garage right away. This includes oil or grease spills. What can I do in the bathroom? Use night lights. Install grab bars by the toilet and in the tub and shower. Do not use towel bars as grab bars. Use non-skid mats or decals in the tub or shower. If you need to sit down in the shower, use a plastic, non-slip stool. Keep the floor dry. Clean up any water that spills on the floor as soon as it happens. Remove soap buildup in the tub or shower regularly. Attach bath mats securely with double-sided non-slip rug tape. Do not have throw rugs and other things on the floor that can make you trip. What can I do in the bedroom? Use night lights. Make sure that you have a light by your bed that is easy to reach. Do not use any sheets or blankets that are too big for your bed. They should not hang down onto the floor. Have a firm chair that has side arms. You can use this for support while you get dressed. Do not have throw rugs and other things on the floor that can make you trip. What can I do in the kitchen?  Clean up any spills right away. Avoid walking on wet floors. Keep items that you use a lot in easy-to-reach places. If you need to reach something above you, use a strong step stool that has a grab bar. Keep electrical cords out of the way. Do not use floor polish or wax that makes floors slippery. If you must use wax, use non-skid floor wax. Do not have throw rugs and other things on the floor that can make you trip. What can I do with my stairs? Do not leave any items on the stairs. Make sure that there are handrails on both sides of the stairs and use them. Fix handrails that are broken or loose. Make sure that handrails are as long as the stairways. Check any carpeting to make sure that it is firmly attached to the stairs. Fix any carpet that is loose or worn. Avoid having throw rugs at the top or  bottom of the stairs. If you do have throw rugs, attach them to the floor with carpet tape. Make sure that you have a light switch at the top of the stairs and the bottom of the stairs. If you do not have them, ask someone to add them for you. What else can I do to help prevent falls? Wear shoes that: Do not have high heels. Have rubber bottoms. Are comfortable and fit you well. Are closed at the toe. Do not wear sandals. If you use a stepladder: Make sure that it is fully opened. Do not climb a closed stepladder. Make sure that both sides of the stepladder are locked into place. Ask someone to hold it for you, if possible. Clearly mark and make sure that you can see: Any grab bars or handrails. First and last steps. Where the edge of each step is. Use tools that help you move around (mobility aids) if they are needed. These include: Canes. Walkers. Scooters. Crutches. Turn on the lights when you go into a dark area. Replace any light bulbs as soon as they burn out. Set up your furniture so you have a clear path. Avoid moving your furniture around. If any of your floors are uneven, fix them. If there are any pets around you, be aware of where they are. Review your medicines with your doctor. Some medicines can make you feel dizzy. This can increase your chance of falling. Ask your doctor what other things that you can do to help prevent falls. This information is not intended to replace advice given to you by your health care provider. Make sure you discuss any questions you have with your health care provider. Document Released: 02/14/2009 Document Revised: 09/26/2015 Document Reviewed: 05/25/2014 Elsevier Interactive Patient Education  2017 [employer].

## 2022-12-11 NOTE — Progress Notes (Signed)
Subjective:   Brenda Grimes is a 57 y.o. female who presents for Medicare Annual (Subsequent) preventive examination.  Visit Complete: Virtual  I connected with  Brenda Grimes on 12/11/22 by a audio enabled telemedicine application and verified that I am speaking with the correct person using two identifiers.  Patient Location: Home  Provider Location: Office/Clinic  I discussed the limitations of evaluation and management by telemedicine. The patient expressed understanding and agreed to proceed.  Vital Signs: Unable to obtain new vitals due to this being a telehealth visit.  Review of Systems     Cardiac Risk Factors include: advanced age (>53men, >70 women);dyslipidemia     Objective:    There were no vitals filed for this visit. There is no height or weight on file to calculate BMI.     12/11/2022   10:19 AM 05/06/2022   10:39 AM 12/05/2021   11:04 AM 08/08/2021    3:09 PM 06/02/2021    9:11 PM 07/16/2020   10:04 AM 05/02/2020    2:36 PM  Advanced Directives  Does Patient Have a Medical Advance Directive? No No No No No No No  Would patient like information on creating a medical advance directive? No - Patient declined No - Patient declined No - Patient declined No - Patient declined No - Patient declined  No - Patient declined    Current Medications (verified) Outpatient Encounter Medications as of 12/11/2022  Medication Sig   acetaminophen (TYLENOL) 500 MG tablet Take 1,000 mg by mouth every 6 (six) hours as needed.   aspirin EC 81 MG tablet Take 1 tablet (81 mg total) by mouth daily. Swallow whole.   buPROPion (WELLBUTRIN XL) 150 MG 24 hr tablet Take 1 tablet by mouth once daily   cyclobenzaprine (FLEXERIL) 10 MG tablet Take 1 tablet by oral route at bedtime.   hydrochlorothiazide (MICROZIDE) 12.5 MG capsule Take 1 capsule (12.5 mg total) by mouth daily as needed.   ibuprofen (ADVIL) 200 MG tablet Take 200 mg by mouth every 6 (six) hours as needed.   levothyroxine  (SYNTHROID) 88 MCG tablet TAKE 1 TABLET BY MOUTH ONCE DAILY BEFORE BREAKFAST   loperamide (IMODIUM) 2 MG capsule Take 2 mg by mouth as needed for diarrhea or loose stools.   nitroGLYCERIN (NITROSTAT) 0.4 MG SL tablet Place under the tongue.   omeprazole (PRILOSEC OTC) 20 MG tablet Take 1 tablet (20 mg total) by mouth daily.   ondansetron (ZOFRAN) 4 MG tablet Take 1 tablet (4 mg total) by mouth every 8 (eight) hours as needed for nausea or vomiting.   simvastatin (ZOCOR) 10 MG tablet Take 1 tablet (10 mg total) by mouth at bedtime.   traMADol (ULTRAM) 50 MG tablet Take 1 tablet (50 mg total) by mouth daily as needed.   venlafaxine XR (EFFEXOR-XR) 37.5 MG 24 hr capsule TAKE 1 CAPSULE BY MOUTH ONCE DAILY WITH BREAKFAST   gabapentin (NEURONTIN) 300 MG capsule TAKE 2 CAPSULES BY MOUTH ONCE DAILY AND 3 NIGHTLY (Patient not taking: Reported on 12/11/2022)   meclizine (ANTIVERT) 25 MG tablet Take 1 tablet (25 mg total) by mouth 3 (three) times daily as needed for dizziness. (Patient not taking: Reported on 12/11/2022)   nitrofurantoin, macrocrystal-monohydrate, (MACROBID) 100 MG capsule Take 1 capsule by mouth every 12 (twelve) hours. (Patient not taking: Reported on 12/11/2022)   Ospemifene 60 MG TABS Take 1 tablet (60 mg total) by mouth daily. (Patient not taking: Reported on 12/11/2022)   No facility-administered encounter medications on file  as of 12/11/2022.    Allergies (verified) Sulfa antibiotics, Sulfasalazine, and Tape   History: Past Medical History:  Diagnosis Date   anal cancer    anal cancer , underwent radiation and chemo   Arthritis    CAD (coronary artery disease)    s/p stenting x 2   Colon polyp    Depression    Hyperlipidemia    MI (myocardial infarction) (HCC)    Thyroid disease    Vitamin D deficiency    Past Surgical History:  Procedure Laterality Date   CARDIAC SURGERY  2010   2 stents   COLONOSCOPY WITH PROPOFOL N/A 04/17/2021   Procedure: COLONOSCOPY WITH BIOPSY;   Surgeon: Toney Reil, MD;  Location[facility];  Service: Endoscopy;  Laterality: N/A;   CORONARY ANGIOPLASTY WITH STENT PLACEMENT     POLYPECTOMY N/A 04/17/2021   Procedure: POLYPECTOMY;  Surgeon: Toney Reil, MD;  Location: [facility];  Service: Endoscopy;  Laterality: N/A;   TUBAL LIGATION     Family History  Problem Relation Age of Onset   Alzheimer's disease Mother    CAD Father    Rheum arthritis Father    Osteoporosis Father    Thyroid disease Brother    Hyperlipidemia Brother    Hyperlipidemia Brother    Alcohol abuse Brother    Thyroid disease Brother    Cirrhosis Brother    Alzheimer's disease Maternal Grandmother    Heart disease Paternal Grandmother    Heart disease Paternal Grandfather    Healthy Daughter    Healthy Son    Healthy Son    Cancer Paternal Aunt        Possible colon cancer   Heart disease Paternal Uncle    Breast cancer Cousin    Social History   Socioeconomic History   Marital status: Married    Spouse name: Not on file   Number of children: Not on file   Years of education: Not on file   Highest education level: Not on file  Occupational History   Not on file  Tobacco Use   Smoking status: Former    Current packs/day: 0.00    Average packs/day: 0.8 packs/day for 30.0 years (22.5 ttl pk-yrs)    Types: Cigarettes    Start date: 01/20/1977    Quit date: 01/21/2007    Years since quitting: 15.8   Smokeless tobacco: Never  Vaping Use   Vaping status: Never Used  Substance and Sexual Activity   Alcohol use: No   Drug use: No   Sexual activity: Not Currently    Birth control/protection: None  Other Topics Concern   Not on file  Social History Narrative   Not on file   Social Determinants of Health   Financial Resource Strain: Low Risk  (12/11/2022)   Overall Financial Resource Strain (CARDIA)    Difficulty of Paying Living Expenses: Not very hard  Food Insecurity: No Food Insecurity (12/11/2022)    Hunger Vital Sign    Worried About Running Out of Food in the Last Year: Never true    Ran Out of Food in the Last Year: Never true  Transportation Needs: No Transportation Needs (12/11/2022)   PRAPARE - Administrator, Civil Service (Medical): No    Lack of Transportation (Non-Medical): No  Physical Activity: Insufficiently Active (12/11/2022)   Exercise Vital Sign    Days of Exercise per Week: 2 days    Minutes of Exercise per Session: 20 min  Stress: No Stress Concern Present (12/11/2022)   [employer] of Occupational Health - Occupational Stress Questionnaire    Feeling of Stress : Only a little  Social Connections: Moderately Isolated (12/11/2022)   Social Connection and Isolation Panel [NHANES]    Frequency of Communication with Friends and Family: More than three times a week    Frequency of Social Gatherings with Friends and Family: More than three times a week    Attends Religious Services: Never    Database administrator or Organizations: No    Attends Engineer, structural: Never    Marital Status: Married    Tobacco Counseling Counseling given: Not Answered   Clinical Intake:  Pre-visit preparation completed: Yes  Pain : No/denies pain     Nutritional Risks: None Diabetes: No  How often do you need to have someone help you when you read instructions, pamphlets, or other written materials from your doctor or pharmacy?: 1 - Never  Interpreter Needed?: No  Information entered by :: Kennedy Bucker, LPN   Activities of Daily Living    12/11/2022   10:20 AM 10/06/2022    9:02 AM  In your present state of health, do you have any difficulty performing the following activities:  Hearing? 0 0  Vision? 0 1  Difficulty concentrating or making decisions? 0 1  Walking or climbing stairs? 0 1  Dressing or bathing? 0 0  Doing errands, shopping? 0 0  Preparing Food and eating ? N   Using the Toilet? N   In the past six months, have you accidently  leaked urine? N   Do you have problems with loss of bowel control? N   Managing your Medications? N   Managing your Finances? N   Housekeeping or managing your Housekeeping? N     Patient Care Team: Lorre Munroe, NP as PCP - General (Internal Medicine)  Indicate any recent Medical Services you may have received from other than [facility] providers in the past year (date may be approximate).     Assessment:   This is a routine wellness examination for Brenda Grimes.  Hearing/Vision screen Hearing Screening - Comments:: No aids Vision Screening - Comments:: Wears glasses- Dr.Woodard  Dietary issues and exercise activities discussed:     Goals Addressed             This Visit's Progress    DIET - INCREASE WATER INTAKE         Depression Screen    12/11/2022   10:17 AM 10/06/2022    9:02 AM 09/02/2022   10:23 AM 04/14/2022    9:28 AM 12/05/2021   11:03 AM 09/09/2021   11:44 AM 08/14/2021    3:11 PM  PHQ 2/9 Scores  PHQ - 2 Score 0 0 0 0 0 0 0  PHQ- 9 Score 0 3   0 1 1    Fall Risk    12/11/2022   10:20 AM 10/06/2022    9:02 AM 09/02/2022   10:23 AM 04/14/2022    9:28 AM 12/05/2021   11:05 AM  Fall Risk   Falls in the past year? 0 0 0 0 1  Number falls in past yr: 0    0  Injury with Fall? 0 0 0 0 0  Risk for fall due to : No Fall Risks No Fall Risks No Fall Risks  History of fall(s)  Follow up Falls prevention discussed;Falls evaluation completed    Falls prevention discussed  MEDICARE RISK AT HOME:  Medicare Risk at Home - 12/11/22 1020     Any stairs in or around the home? No    If so, are there any without handrails? No    Home free of loose throw rugs in walkways, pet beds, electrical cords, etc? Yes    Adequate lighting in your home to reduce risk of falls? Yes    Life alert? No    Use of a cane, walker or w/c? No    Grab bars in the bathroom? Yes    Shower chair or bench in shower? Yes    Elevated toilet seat or a handicapped toilet? No             TIMED UP  AND GO:  Was the test performed?  No    Cognitive Function:        12/11/2022   10:21 AM 12/05/2021   11:06 AM 07/16/2020   10:12 AM  6CIT Screen  What Year? 0 points 0 points 0 points  What month? 0 points 0 points 0 points  What time? 0 points 0 points 0 points  Count back from 20 0 points 0 points 0 points  Months in reverse 0 points 0 points 2 points  Repeat phrase 0 points 0 points 6 points  Total Score 0 points 0 points 8 points    Immunizations Immunization History  Administered Date(s) Administered   Influenza, Seasonal, Injecte, Preservative Fre 03/28/2013   Influenza,inj,Quad PF,6+ Mos 01/15/2014, 01/31/2015, 04/02/2016, 01/21/2017, 01/31/2018, 01/27/2019, 04/04/2020, 02/04/2021   Influenza-Unspecified 01/15/2014, 01/31/2015, 04/02/2016, 01/21/2017   Moderna Sars-Covid-2 Vaccination 10/10/2019, 11/09/2019   Tdap 09/09/2021    TDAP status: Up to date  Flu Vaccine status: Declined, Education has been provided regarding the importance of this vaccine but patient still declined. Advised may receive this vaccine at local pharmacy or Health Dept. Aware to provide a copy of the vaccination record if obtained from local pharmacy or Health Dept. Verbalized acceptance and understanding.  Pneumococcal vaccine status: Declined,  Education has been provided regarding the importance of this vaccine but patient still declined. Advised may receive this vaccine at local pharmacy or Health Dept. Aware to provide a copy of the vaccination record if obtained from local pharmacy or Health Dept. Verbalized acceptance and understanding.   Covid-19 vaccine status: Completed vaccines  Qualifies for Shingles Vaccine? Yes   Zostavax completed No   Shingrix Completed?: No.    Education has been provided regarding the importance of this vaccine. Patient has been advised to call insurance company to determine out of pocket expense if they have not yet received this vaccine. Advised may also receive  vaccine at local pharmacy or Health Dept. Verbalized acceptance and understanding.  Screening Tests Health Maintenance  Topic Date Due   Zoster Vaccines- Shingrix (1 of 2) Never done   MAMMOGRAM  02/08/2021   INFLUENZA VACCINE  12/03/2022   Colonoscopy  04/17/2028   DTaP/Tdap/Td (2 - Td or Tdap) 09/10/2031   Hepatitis C Screening  Completed   HIV Screening  Completed   HPV VACCINES  Aged Out   Lung Cancer Screening  Discontinued   PAP SMEAR-Modifier  Discontinued   COVID-19 Vaccine  Discontinued    Health Maintenance  Health Maintenance Due  Topic Date Due   Zoster Vaccines- Shingrix (1 of 2) Never done   MAMMOGRAM  02/08/2021   INFLUENZA VACCINE  12/03/2022    Colorectal cancer screening: Type of screening: Colonoscopy. Completed 04/17/21. Repeat every 7 years  Mammogram status: Ordered 12/11/22. Pt provided with contact info and advised to call to schedule appt.     Lung Cancer Screening: (Low Dose CT Chest recommended if Age 53-80 years, 20 pack-year currently smoking OR have quit w/in 15years.) does not qualify.    Additional Screening:  Hepatitis C Screening: does qualify; Completed 09/09/21  Vision Screening: Recommended annual ophthalmology exams for early detection of glaucoma and other disorders of the eye. Is the patient up to date with their annual eye exam?  Yes  Who is the provider or what is the name of the office in which the patient attends annual eye exams? Dr.Woodard If pt is not established with a provider, would they like to be referred to a provider to establish care? No .   Dental Screening: Recommended annual dental exams for proper oral hygiene   Community Resource Referral / Chronic Care Management: CRR required this visit?  No   CCM required this visit?  No     Plan:     I have personally reviewed and noted the following in the patient's chart:   Medical and social history Use of alcohol, tobacco or illicit drugs  Current medications  and supplements including opioid prescriptions. Patient is not currently taking opioid prescriptions. Functional ability and status Nutritional status Physical activity Advanced directives List of other physicians Hospitalizations, surgeries, and ER visits in previous 12 months Vitals Screenings to include cognitive, depression, and falls Referrals and appointments  In addition, I have reviewed and discussed with patient certain preventive protocols, quality metrics, and best practice recommendations. A written personalized care plan for preventive services as well as general preventive health recommendations were provided to patient.     Hal Hope, LPN   10/06/7844   After Visit Summary: (MyChart) Due to this being a telephonic visit, the after visit summary with patients personalized plan was offered to patient via MyChart   Nurse Notes: none

## 2022-12-12 ENCOUNTER — Encounter: Payer: Self-pay | Admitting: Internal Medicine

## 2022-12-15 ENCOUNTER — Encounter: Payer: Self-pay | Admitting: Internal Medicine

## 2022-12-15 ENCOUNTER — Telehealth (INDEPENDENT_AMBULATORY_CARE_PROVIDER_SITE_OTHER): Payer: BC Managed Care – PPO | Admitting: Internal Medicine

## 2022-12-15 DIAGNOSIS — F339 Major depressive disorder, recurrent, unspecified: Secondary | ICD-10-CM | POA: Diagnosis not present

## 2022-12-15 DIAGNOSIS — N951 Menopausal and female climacteric states: Secondary | ICD-10-CM

## 2022-12-15 DIAGNOSIS — K529 Noninfective gastroenteritis and colitis, unspecified: Secondary | ICD-10-CM

## 2022-12-15 DIAGNOSIS — Z85048 Personal history of other malignant neoplasm of rectum, rectosigmoid junction, and anus: Secondary | ICD-10-CM | POA: Diagnosis not present

## 2022-12-15 MED ORDER — ESCITALOPRAM OXALATE 20 MG PO TABS: ORAL_TABLET | ORAL | 0 refills | Status: DC

## 2022-12-15 NOTE — Progress Notes (Signed)
Virtual Visit via Video Note  I connected with Brenda Grimes on 12/15/22 at  4:00 PM EDT by a video enabled telemedicine application and verified that I am speaking with the correct person using two identifiers.  Location: Patient: Work Provider: Office  Persons participating in this video call: Ovidio Hanger, NP and Alexsia she   I discussed the limitations of evaluation and management by telemedicine and the availability of in person appointments. The patient expressed understanding and agreed to proceed.  History of Present Illness:  Patient requesting to discontinue bupropion and switch back to escitalopram.  She recently stopped taking this because she felt like it made her chronic diarrhea worse. She started taking a leftover RX for citalopram. She is also currently on venlafaxine. She is not currently seeing a therapist.  She denies anxiety, SI/HI.  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  Colonoscopy from 04/2021 reviewed.   Past Medical History:  Diagnosis Date   anal cancer    anal cancer , underwent radiation and chemo   Arthritis    CAD (coronary artery disease)    s/p stenting x 2   Colon polyp    Depression    Hyperlipidemia    MI (myocardial infarction) (HCC)    Thyroid disease    Vitamin D deficiency     Current Outpatient Medications  Medication Sig Dispense Refill   acetaminophen (TYLENOL) 500 MG tablet Take 1,000 mg by mouth every 6 (six) hours as needed.     aspirin EC 81 MG tablet Take 1 tablet (81 mg total) by mouth daily. Swallow whole. 30 tablet 12   buPROPion (WELLBUTRIN XL) 150 MG 24 hr tablet Take 1 tablet by mouth once daily 90 tablet 1   cyclobenzaprine (FLEXERIL) 10 MG tablet Take 1 tablet by oral route at bedtime. 30 tablet 0   gabapentin (NEURONTIN) 300 MG capsule TAKE 2 CAPSULES BY MOUTH ONCE DAILY AND 3 NIGHTLY (Patient not taking: Reported on 12/11/2022)     hydrochlorothiazide (MICROZIDE)  12.5 MG capsule Take 1 capsule (12.5 mg total) by mouth daily as needed. 30 capsule 0   ibuprofen (ADVIL) 200 MG tablet Take 200 mg by mouth every 6 (six) hours as needed.     levothyroxine (SYNTHROID) 88 MCG tablet TAKE 1 TABLET BY MOUTH ONCE DAILY BEFORE BREAKFAST 90 tablet 1   loperamide (IMODIUM) 2 MG capsule Take 2 mg by mouth as needed for diarrhea or loose stools.     meclizine (ANTIVERT) 25 MG tablet Take 1 tablet (25 mg total) by mouth 3 (three) times daily as needed for dizziness. (Patient not taking: Reported on 12/11/2022) 30 tablet 0   nitrofurantoin, macrocrystal-monohydrate, (MACROBID) 100 MG capsule Take 1 capsule by mouth every 12 (twelve) hours. (Patient not taking: Reported on 12/11/2022)     nitroGLYCERIN (NITROSTAT) 0.4 MG SL tablet Place under the tongue.     omeprazole (PRILOSEC OTC) 20 MG tablet Take 1 tablet (20 mg total) by mouth daily.     ondansetron (ZOFRAN) 4 MG tablet Take 1 tablet (4 mg total) by mouth every 8 (eight) hours as needed for nausea or vomiting. 20 tablet 0   Ospemifene 60 MG TABS Take 1 tablet (60 mg total) by mouth daily. (Patient not taking: Reported on 12/11/2022) 30 tablet 2   simvastatin (ZOCOR) 10 MG tablet Take 1 tablet (10 mg total) by mouth at bedtime. 90 tablet 1   traMADol (ULTRAM) 50 MG tablet Take 1  tablet (50 mg total) by mouth daily as needed. 30 tablet 0   venlafaxine XR (EFFEXOR-XR) 37.5 MG 24 hr capsule TAKE 1 CAPSULE BY MOUTH ONCE DAILY WITH BREAKFAST 30 capsule 5   No current facility-administered medications for this visit.    Allergies  Allergen Reactions   Sulfa Antibiotics Rash   Sulfasalazine Rash   Tape Rash    Family History  Problem Relation Age of Onset   Alzheimer's disease Mother    CAD Father    Rheum arthritis Father    Osteoporosis Father    Thyroid disease Brother    Hyperlipidemia Brother    Hyperlipidemia Brother    Alcohol abuse Brother    Thyroid disease Brother    Cirrhosis Brother    Alzheimer's  disease Maternal Grandmother    Heart disease Paternal Grandmother    Heart disease Paternal Grandfather    Healthy Daughter    Healthy Son    Healthy Son    Cancer Paternal Aunt        Possible colon cancer   Heart disease Paternal Uncle    Breast cancer Cousin     Social History   Socioeconomic History   Marital status: Married    Spouse name: Not on file   Number of children: Not on file   Years of education: Not on file   Highest education level: Not on file  Occupational History   Not on file  Tobacco Use   Smoking status: Former    Current packs/day: 0.00    Average packs/day: 0.8 packs/day for 30.0 years (22.5 ttl pk-yrs)    Types: Cigarettes    Start date: 01/20/1977    Quit date: 01/21/2007    Years since quitting: 15.9   Smokeless tobacco: Never  Vaping Use   Vaping status: Never Used  Substance and Sexual Activity   Alcohol use: No   Drug use: No   Sexual activity: Not Currently    Birth control/protection: None  Other Topics Concern   Not on file  Social History Narrative   Not on file   Social Determinants of Health   Financial Resource Strain: Low Risk  (12/11/2022)   Overall Financial Resource Strain (CARDIA)    Difficulty of Paying Living Expenses: Not very hard  Food Insecurity: No Food Insecurity (12/11/2022)   Hunger Vital Sign    Worried About Running Out of Food in the Last Year: Never true    Ran Out of Food in the Last Year: Never true  Transportation Needs: No Transportation Needs (12/11/2022)   PRAPARE - Administrator, Civil Service (Medical): No    Lack of Transportation (Non-Medical): No  Physical Activity: Insufficiently Active (12/11/2022)   Exercise Vital Sign    Days of Exercise per Week: 2 days    Minutes of Exercise per Session: 20 min  Stress: No Stress Concern Present (12/11/2022)   [employer] of Occupational Health - Occupational Stress Questionnaire    Feeling of Stress : Only a little  Social Connections:  Moderately Isolated (12/11/2022)   Social Connection and Isolation Panel [NHANES]    Frequency of Communication with Friends and Family: More than three times a week    Frequency of Social Gatherings with Friends and Family: More than three times a week    Attends Religious Services: Never    Database administrator or Organizations: No    Attends Banker Meetings: Never    Marital Status: Married  Intimate  Partner Violence: Not At Risk (12/11/2022)   Humiliation, Afraid, Rape, and Kick questionnaire    Fear of Current or Ex-Partner: No    Emotionally Abused: No    Physically Abused: No    Sexually Abused: No     Constitutional: Denies fever, malaise, fatigue, headache or abrupt weight changes.  HEENT: Denies eye pain, eye redness, ear pain, ringing in the ears, wax buildup, runny nose, nasal congestion, bloody nose, or sore throat. Respiratory: Denies difficulty breathing, shortness of breath, cough or sputum production.   Cardiovascular: Denies chest pain, chest tightness, palpitations or swelling in the hands or feet.  Gastrointestinal: Patient reports chronic diarrhea.  Denies abdominal pain, bloating, constipation, or blood in the stool.  GU: Denies urgency, frequency, pain with urination, burning sensation, blood in urine, odor or discharge. Musculoskeletal: Patient reports chronic back pain.  Denies decrease in range of motion, difficulty with gait, or joint swelling.  Skin: Denies redness, rashes, lesions or ulcercations.  Neurological: Patient reports hot flashes.  Denies dizziness, difficulty with memory, difficulty with speech or problems with balance and coordination.  Psych: Patient has a history of depression.  Denies anxiety, SI/HI.  No other specific complaints in a complete review of systems (except as listed in HPI above).    Observations/Objective:  Wt Readings from Last 3 Encounters:  11/11/22 184 lb (83.5 kg)  10/06/22 183 lb (83 kg)  09/02/22 184 lb  (83.5 kg)    General: Appears her stated age, obese, in NAD. Pulmonary/Chest: Normal effort. No respiratory distress. .  Neurological: Alert and oriented.  Psychiatric: Mood and affect normal. Behavior is normal. Judgment and thought content normal.    BMET    Component Value Date/Time   NA 141 11/11/2022 1104   K 4.4 11/11/2022 1104   CL 105 11/11/2022 1104   CO2 28 11/11/2022 1104   GLUCOSE 83 11/11/2022 1104   BUN 13 11/11/2022 1104   CREATININE 0.65 11/11/2022 1104   CALCIUM 9.1 11/11/2022 1104   GFRNONAA >60 08/08/2021 1509   GFRNONAA 79 04/04/2020 0904   GFRAA 91 04/04/2020 0904    Lipid Panel     Component Value Date/Time   CHOL 218 (H) 10/06/2022 0857   TRIG 118 10/06/2022 0857   HDL 65 10/06/2022 0857   CHOLHDL 3.4 10/06/2022 0857   LDLCALC 130 (H) 10/06/2022 0857    CBC    Component Value Date/Time   WBC 3.5 (L) 11/11/2022 1104   RBC 3.64 (L) 11/11/2022 1104   HGB 12.0 11/11/2022 1104   HCT 36.3 11/11/2022 1104   PLT 181 11/11/2022 1104   MCV 99.7 11/11/2022 1104   MCH 33.0 11/11/2022 1104   MCHC 33.1 11/11/2022 1104   RDW 12.1 11/11/2022 1104   LYMPHSABS 921 07/24/2021 1010   MONOABS 0.3 04/15/2020 1209   EOSABS 60 07/24/2021 1010   BASOSABS 39 07/24/2021 1010    Hgb A1C Lab Results  Component Value Date   HGBA1C 5.4 10/06/2022       Assessment and Plan:  Depression, hot flashes and 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:  She is already stopped bupropion and restarted citalopram from a leftover prescription Advised her to stop citalopram as she is also on venlafaxine and these medications are related She will plan to wean off the venlafaxine by taking 1 tablet every other day and then stopping Rx for escitalopram 20 mg, take half tab daily for 4 weeks then increase to 1 tab daily thereafter Okay to continue Imodium as  needed for diarrhea She will plan to follow-up with GI as needed  RTC in 4 months for follow-up of chronic  conditions  Follow Up Instructions:    I discussed the assessment and treatment plan with the patient. The patient was provided an opportunity to ask questions and all were answered. The patient agreed with the plan and demonstrated an understanding of the instructions.   The patient was advised to call back or seek an in-person evaluation if the symptoms worsen or if the condition fails to improve as anticipated.    Nicki Reaper, NP

## 2022-12-15 NOTE — Patient Instructions (Signed)
Diarrhea, Adult Diarrhea is when you pass loose and sometimes watery poop (stool) often. Diarrhea can make you feel weak and cause you to lose water in your body (get dehydrated). Losing water in your body can cause you to: Feel tired and thirsty. Have a dry mouth. Go pee (urinate) less often. Diarrhea often lasts 2-3 days. It can last longer if it is a sign of something more serious. Be sure to treat your diarrhea as told by your doctor. Follow these instructions at home: Eating and drinking     Follow these instructions as told by your doctor: Take an ORS (oral rehydration solution). This is a drink that helps you replace fluids and minerals your body lost. It is sold at pharmacies and stores. Drink enough fluid to keep your pee (urine) pale yellow. Drink fluids such as: Water. You can also get fluids by sucking on ice chips. Diluted fruit juice. Low-calorie sports drinks. Milk. Avoid drinking fluids that have a lot of sugar or caffeine in them. These include soda, energy drinks, and regular sports drinks. Avoid alcohol. Eat bland, easy-to-digest foods in small amounts as you are able. These foods include: Bananas. Applesauce. Rice. Low-fat (lean) meats. Toast. Crackers. Avoid spicy or fatty foods.  Medicines Take over-the-counter and prescription medicines only as told by your doctor. If you were prescribed antibiotics, take them as told by your doctor. Do not stop taking them even if you start to feel better. General instructions  Wash your hands often using soap and water for 20 seconds. If soap and water are not available, use hand sanitizer. Others in your home should wash their hands as well. Wash your hands: After using the toilet or changing a diaper. Before preparing, cooking, or serving food. While caring for a sick person. While visiting someone in a hospital. Rest at home while you get better. Take a warm bath to help with any burning or pain from having  diarrhea. Watch your condition for any changes. Contact a doctor if: You have a fever. Your diarrhea gets worse. You have new symptoms. You vomit every time you eat or drink. You feel light-headed, dizzy, or you have a headache. You have muscle cramps. You have signs of losing too much water in your body, such as: Dark pee, very little pee, or no pee. Cracked lips. Dry mouth. Sunken eyes. Sleepiness. Weakness. You have bloody or black poop or poop that looks like tar. You have very bad pain, cramping, or bloating in your belly (abdomen). Your skin feels cold and clammy. You feel confused. Get help right away if: You have chest pain. Your heart is beating very quickly. You have trouble breathing or you are breathing very quickly. You feel very weak or you faint. These symptoms may be an emergency. Get help right away. Call 911. Do not wait to see if the symptoms will go away. Do not drive yourself to the hospital. This information is not intended to replace advice given to you by your health care provider. Make sure you discuss any questions you have with your health care provider. Document Revised: 10/07/2021 Document Reviewed: 10/07/2021 Elsevier Patient Education  2024 [employer].

## 2022-12-17 DIAGNOSIS — N895 Stricture and atresia of vagina: Secondary | ICD-10-CM | POA: Diagnosis not present

## 2022-12-17 DIAGNOSIS — Z923 Personal history of irradiation: Secondary | ICD-10-CM | POA: Diagnosis not present

## 2023-01-26 ENCOUNTER — Other Ambulatory Visit: Payer: Self-pay | Admitting: Internal Medicine

## 2023-01-26 MED ORDER — TRAMADOL HCL 50 MG PO TABS: 50.0000 mg | ORAL_TABLET | Freq: Every day | ORAL | 0 refills | Status: DC | PRN

## 2023-03-04 ENCOUNTER — Other Ambulatory Visit: Payer: Self-pay | Admitting: Internal Medicine

## 2023-03-04 MED ORDER — TRAMADOL HCL 50 MG PO TABS: 50.0000 mg | ORAL_TABLET | Freq: Every day | ORAL | 0 refills | Status: DC | PRN

## 2023-03-04 NOTE — Telephone Encounter (Signed)
Requested Prescriptions  Pending Prescriptions Disp Refills   levothyroxine (SYNTHROID) 88 MCG tablet [Pharmacy Med Name: Levothyroxine Sodium 88 MCG Oral Tablet] 90 tablet 2    Sig: TAKE 1 TABLET BY MOUTH ONCE DAILY BEFORE BREAKFAST     Endocrinology:  Hypothyroid Agents Passed - 03/04/2023 11:11 AM      Passed - TSH in normal range and within 360 days    TSH  Date Value Ref Range Status  10/06/2022 1.80 0.40 - 4.50 mIU/L Final         Passed - Valid encounter within last 12 months    Recent Outpatient Visits           2 months ago Depression, recurrent [facility])   [facility] [place], Salvadore Oxford, NP   3 months ago Acute cough   [facility] [place], Salvadore Oxford, NP   4 months ago Encounter for general adult medical examination with abnormal findings   [place] [facility] [place], Salvadore Oxford, NP   6 months ago Peripheral edema   [facility] [place], Salvadore Oxford, NP   10 months ago Chemotherapy-induced neutropenia [facility])   [facility] [place], Salvadore Oxford, NP       Future Appointments             In 1 month [place], Salvadore Oxford, NP  [facility], [facility]

## 2023-03-18 ENCOUNTER — Telehealth: Payer: Self-pay | Admitting: Family Medicine

## 2023-03-18 NOTE — Telephone Encounter (Signed)
Please give me a call back I am speaking on behalf of my brother who is in my care from surgery he has Hep c and is out of his meds All his services are from [place] . I am looking to find him some help getting his meds while he is here with me

## 2023-03-18 NOTE — Telephone Encounter (Signed)
Attempted to call back x2 ,someone would answer and phone would disconnect.

## 2023-04-08 ENCOUNTER — Ambulatory Visit: Payer: BC Managed Care – PPO | Admitting: Internal Medicine

## 2023-04-08 ENCOUNTER — Encounter: Payer: Self-pay | Admitting: Internal Medicine

## 2023-04-08 VITALS — BP 118/72 | Ht 63.0 in | Wt 185.6 lb

## 2023-04-08 DIAGNOSIS — R739 Hyperglycemia, unspecified: Secondary | ICD-10-CM

## 2023-04-08 DIAGNOSIS — T451X5A Adverse effect of antineoplastic and immunosuppressive drugs, initial encounter: Secondary | ICD-10-CM

## 2023-04-08 DIAGNOSIS — D701 Agranulocytosis secondary to cancer chemotherapy: Secondary | ICD-10-CM

## 2023-04-08 DIAGNOSIS — R0781 Pleurodynia: Secondary | ICD-10-CM

## 2023-04-08 DIAGNOSIS — E66811 Obesity, class 1: Secondary | ICD-10-CM

## 2023-04-08 DIAGNOSIS — E785 Hyperlipidemia, unspecified: Secondary | ICD-10-CM

## 2023-04-08 DIAGNOSIS — E039 Hypothyroidism, unspecified: Secondary | ICD-10-CM | POA: Diagnosis not present

## 2023-04-08 DIAGNOSIS — I25119 Atherosclerotic heart disease of native coronary artery with unspecified angina pectoris: Secondary | ICD-10-CM

## 2023-04-08 DIAGNOSIS — Z23 Encounter for immunization: Secondary | ICD-10-CM | POA: Diagnosis not present

## 2023-04-08 DIAGNOSIS — Z6832 Body mass index (BMI) 32.0-32.9, adult: Secondary | ICD-10-CM

## 2023-04-08 DIAGNOSIS — E6609 Other obesity due to excess calories: Secondary | ICD-10-CM

## 2023-04-08 DIAGNOSIS — E559 Vitamin D deficiency, unspecified: Secondary | ICD-10-CM

## 2023-04-08 DIAGNOSIS — K529 Noninfective gastroenteritis and colitis, unspecified: Secondary | ICD-10-CM

## 2023-04-08 DIAGNOSIS — F339 Major depressive disorder, recurrent, unspecified: Secondary | ICD-10-CM

## 2023-04-08 DIAGNOSIS — K219 Gastro-esophageal reflux disease without esophagitis: Secondary | ICD-10-CM

## 2023-04-08 DIAGNOSIS — Z85048 Personal history of other malignant neoplasm of rectum, rectosigmoid junction, and anus: Secondary | ICD-10-CM

## 2023-04-08 DIAGNOSIS — M5137 Other intervertebral disc degeneration, lumbosacral region with discogenic back pain only: Secondary | ICD-10-CM

## 2023-04-08 NOTE — Assessment & Plan Note (Signed)
Continue escitalopram Support offered 

## 2023-04-08 NOTE — Progress Notes (Signed)
Subjective:    Patient ID: Brenda Grimes, female    DOB: [birth year], 57 y.o.   MRN: [id number]  HPI  Patient presents to clinic today for 1-month follow-up of chronic conditions.  HLD with CAD: Her last LDL was 130, triglycerides 118, 10/2022.  She denies myalgias on simvastatin. She is taking aspirin. She tries to consume low-fat diet.  Hypothyroidism: She denies any issues on her current dose of levothyroxine.  She does not follow with endocrinology.  History of anal cancer, chronic abdominal pain with diarrhea: In remission.  Her symptoms are managed with imodium, zofran and tramadol.  She continues to follow with GI.  OA/chronic pain: Mainly in her ack aand hips.  She takes tylenol, tramadol and gabapentin (as needed) as prescribed.  She does not follow with orthopedics.  Depression: Chronic, managed on escitalopram.  She is not currently seeing a therapist.  She denies anxiety, SI/HI.  GERD: Triggered by spicy foods.  She takes omeprazole as needed.  There is no upper GI on file.  Neutropenia: Her last WBC count was 3.5, 11/2022.  She does not follow with hematology.  Review of Systems     Past Medical History:  Diagnosis Date   anal cancer    anal cancer , underwent radiation and chemo   Arthritis    CAD (coronary artery disease)    s/p stenting x 2   Colon polyp    Depression    Hyperlipidemia    MI (myocardial infarction) (HCC)    Thyroid disease    Vitamin D deficiency     Current Outpatient Medications  Medication Sig Dispense Refill   acetaminophen (TYLENOL) 500 MG tablet Take 1,000 mg by mouth every 6 (six) hours as needed.     aspirin EC 81 MG tablet Take 1 tablet (81 mg total) by mouth daily. Swallow whole. 30 tablet 12   cyclobenzaprine (FLEXERIL) 10 MG tablet Take 1 tablet by oral route at bedtime. 30 tablet 0   escitalopram (LEXAPRO) 20 MG tablet Take 0.5 tablets (10 mg total) by mouth daily for 30 days, THEN 1 tablet (20 mg total) daily. 75 tablet 0    hydrochlorothiazide (MICROZIDE) 12.5 MG capsule Take 1 capsule (12.5 mg total) by mouth daily as needed. 30 capsule 0   ibuprofen (ADVIL) 200 MG tablet Take 200 mg by mouth every 6 (six) hours as needed.     levothyroxine (SYNTHROID) 88 MCG tablet TAKE 1 TABLET BY MOUTH ONCE DAILY BEFORE BREAKFAST 90 tablet 2   loperamide (IMODIUM) 2 MG capsule Take 2 mg by mouth as needed for diarrhea or loose stools.     nitroGLYCERIN (NITROSTAT) 0.4 MG SL tablet Place under the tongue.     omeprazole (PRILOSEC OTC) 20 MG tablet Take 1 tablet (20 mg total) by mouth daily.     ondansetron (ZOFRAN) 4 MG tablet Take 1 tablet (4 mg total) by mouth every 8 (eight) hours as needed for nausea or vomiting. 20 tablet 0   simvastatin (ZOCOR) 10 MG tablet Take 1 tablet (10 mg total) by mouth at bedtime. 90 tablet 1   traMADol (ULTRAM) 50 MG tablet Take 1 tablet (50 mg total) by mouth daily as needed. 30 tablet 0   No current facility-administered medications for this visit.    Allergies  Allergen Reactions   Sulfa Antibiotics Rash   Sulfasalazine Rash   Tape Rash    Family History  Problem Relation Age of Onset   Alzheimer's disease Mother  CAD Father    Rheum arthritis Father    Osteoporosis Father    Thyroid disease Brother    Hyperlipidemia Brother    Hyperlipidemia Brother    Alcohol abuse Brother    Thyroid disease Brother    Cirrhosis Brother    Alzheimer's disease Maternal Grandmother    Heart disease Paternal Grandmother    Heart disease Paternal Grandfather    Healthy Daughter    Healthy Son    Healthy Son    Cancer Paternal Aunt        Possible colon cancer   Heart disease Paternal Uncle    Breast cancer Cousin     Social History   Socioeconomic History   Marital status: Married    Spouse name: Not on file   Number of children: Not on file   Years of education: Not on file   Highest education level: Not on file  Occupational History   Not on file  Tobacco Use   Smoking  status: Former    Current packs/day: 0.00    Average packs/day: 0.8 packs/day for 30.0 years (22.5 ttl pk-yrs)    Types: Cigarettes    Start date: 01/20/1977    Quit date: 01/21/2007    Years since quitting: 16.2   Smokeless tobacco: Never  Vaping Use   Vaping status: Never Used  Substance and Sexual Activity   Alcohol use: No   Drug use: No   Sexual activity: Not Currently    Birth control/protection: None  Other Topics Concern   Not on file  Social History Narrative   Not on file   Social Determinants of Health   Financial Resource Strain: Low Risk  (12/11/2022)   Overall Financial Resource Strain (CARDIA)    Difficulty of Paying Living Expenses: Not very hard  Food Insecurity: No Food Insecurity (12/11/2022)   Hunger Vital Sign    Worried About Running Out of Food in the Last Year: Never true    Ran Out of Food in the Last Year: Never true  Transportation Needs: No Transportation Needs (12/11/2022)   PRAPARE - Administrator, Civil Service (Medical): No    Lack of Transportation (Non-Medical): No  Physical Activity: Insufficiently Active (12/11/2022)   Exercise Vital Sign    Days of Exercise per Week: 2 days    Minutes of Exercise per Session: 20 min  Stress: No Stress Concern Present (12/11/2022)   [employer] of Occupational Health - Occupational Stress Questionnaire    Feeling of Stress : Only a little  Social Connections: Moderately Isolated (12/11/2022)   Social Connection and Isolation Panel [NHANES]    Frequency of Communication with Friends and Family: More than three times a week    Frequency of Social Gatherings with Friends and Family: More than three times a week    Attends Religious Services: Never    Database administrator or Organizations: No    Attends Banker Meetings: Never    Marital Status: Married  Catering manager Violence: Not At Risk (12/11/2022)   Humiliation, Afraid, Rape, and Kick questionnaire    Fear of Current or  Ex-Partner: No    Emotionally Abused: No    Physically Abused: No    Sexually Abused: No     Constitutional: Denies fever, malaise, fatigue, headache or abrupt weight changes.  HEENT: Denies eye pain, eye redness, ear pain, ringing in the ears, wax buildup, runny nose, nasal congestion, bloody nose, or sore throat. Respiratory: Denies difficulty  breathing, shortness of breath, cough or sputum production.   Cardiovascular: Denies chest pain, chest tightness, palpitations or swelling in the hands or feet.  Gastrointestinal: Patient reports chronic abdominal pain and diarrhea.  Denies bloating, constipation,or blood in the stool.  GU: Denies urgency, frequency, pain with urination, burning sensation, blood in urine, odor or discharge. Musculoskeletal: Patient reports chronic joint pain.  Denies decrease in range of motion, difficulty with gait, muscle pain or joint swelling.  Skin: Denies redness, rashes, lesions or ulcercations.  Neurological: Denies dizziness, difficulty with memory, difficulty with speech or problems with balance and coordination.  Psych: Patient has a history of depression.  Denies anxiety, SI/HI.  No other specific complaints in a complete review of systems (except as listed in HPI above).  Objective:   Physical Exam  BP 118/72   Ht 5\' 3"  (1.6 m)   Wt 185 lb 9.6 oz (84.2 kg)   BMI 32.88 kg/m    Wt Readings from Last 3 Encounters:  11/11/22 184 lb (83.5 kg)  10/06/22 183 lb (83 kg)  09/02/22 184 lb (83.5 kg)    General: Appears her stated age, obese in NAD. Skin: Warm, dry and intact.  HEENT: Head: normal shape and size; Eyes: sclera white, no icterus, conjunctiva pink, PERRLA and EOMs intact;  Neck:  Neck supple, trachea midline. No masses, lumps or thyromegaly present.  Cardiovascular: Normal rate and rhythm. S1,S2 noted.  No murmur, rubs or gallops noted. No JVD or BLE edema. No carotid bruits noted. Pulmonary/Chest: Normal effort and positive vesicular  breath sounds. No respiratory distress. No wheezes, rales or ronchi noted.  Abdomen: Soft and nontender. Normal bowel sounds.  Musculoskeletal:  Pain with palpation at the right lateral ribs. No difficulty with gait.  Neurological: Alert and oriented.  Coordination normal.  Psychiatric: Mood and affect normal. Behavior is normal. Judgment and thought content normal.     BMET    Component Value Date/Time   NA 141 11/11/2022 1104   K 4.4 11/11/2022 1104   CL 105 11/11/2022 1104   CO2 28 11/11/2022 1104   GLUCOSE 83 11/11/2022 1104   BUN 13 11/11/2022 1104   CREATININE 0.65 11/11/2022 1104   CALCIUM 9.1 11/11/2022 1104   GFRNONAA >60 08/08/2021 1509   GFRNONAA 79 04/04/2020 0904   GFRAA 91 04/04/2020 0904    Lipid Panel     Component Value Date/Time   CHOL 218 (H) 10/06/2022 0857   TRIG 118 10/06/2022 0857   HDL 65 10/06/2022 0857   CHOLHDL 3.4 10/06/2022 0857   LDLCALC 130 (H) 10/06/2022 0857    CBC    Component Value Date/Time   WBC 3.5 (L) 11/11/2022 1104   RBC 3.64 (L) 11/11/2022 1104   HGB 12.0 11/11/2022 1104   HCT 36.3 11/11/2022 1104   PLT 181 11/11/2022 1104   MCV 99.7 11/11/2022 1104   MCH 33.0 11/11/2022 1104   MCHC 33.1 11/11/2022 1104   RDW 12.1 11/11/2022 1104   LYMPHSABS 921 07/24/2021 1010   MONOABS 0.3 04/15/2020 1209   EOSABS 60 07/24/2021 1010   BASOSABS 39 07/24/2021 1010    Hgb A1C Lab Results  Component Value Date   HGBA1C 5.4 10/06/2022            Assessment & Plan:   Rib pain on right side:  Advised use of heating pad Recommend Tylenol OTC as needed Let me know if symptoms persist or worsen  RTC in 6 months for your annual exam Nicki Reaper,  NP

## 2023-04-08 NOTE — Assessment & Plan Note (Signed)
TSH and free T4 today We will adjust levothyroxine if needed based on labs 

## 2023-04-08 NOTE — Assessment & Plan Note (Signed)
C-Met and lipid profile today Continue simvastatin Encouraged her to consume low-fat diet

## 2023-04-08 NOTE — Assessment & Plan Note (Signed)
Continue Pepto-Bismol and Imodium as needed

## 2023-04-08 NOTE — Assessment & Plan Note (Signed)
Encouraged diet and exercise for weight loss ?

## 2023-04-08 NOTE — Assessment & Plan Note (Signed)
Avoid foods that trigger reflux Encourage weight loss as this can help reduce reflux symptoms Continue omeprazole 

## 2023-04-08 NOTE — Patient Instructions (Signed)

## 2023-04-08 NOTE — Assessment & Plan Note (Signed)
C-Met and lipid profile today Continue simvastatin and aspirin Encouraged her to consume a low-fat diet

## 2023-04-08 NOTE — Assessment & Plan Note (Signed)
Encourage regular stretching and core strengthening Continue Tylenol, tramadol and gabapentin as needed

## 2023-04-08 NOTE — Assessment & Plan Note (Signed)
CBC today.  

## 2023-04-08 NOTE — Assessment & Plan Note (Signed)
In remission.

## 2023-04-09 LAB — COMPLETE METABOLIC PANEL WITH GFR
AG Ratio: 2.1 (calc) (ref 1.0–2.5)
ALT: 13 U/L (ref 6–29)
AST: 18 U/L (ref 10–35)
Albumin: 4.2 g/dL (ref 3.6–5.1)
Alkaline phosphatase (APISO): 84 U/L (ref 37–153)
BUN: 16 mg/dL (ref 7–25)
CO2: 30 mmol/L (ref 20–32)
Calcium: 8.5 mg/dL — ABNORMAL LOW (ref 8.6–10.4)
Chloride: 107 mmol/L (ref 98–110)
Creat: 0.68 mg/dL (ref 0.50–1.03)
Globulin: 2 g/dL (ref 1.9–3.7)
Glucose, Bld: 92 mg/dL (ref 65–99)
Potassium: 4.6 mmol/L (ref 3.5–5.3)
Sodium: 143 mmol/L (ref 135–146)
Total Bilirubin: 0.5 mg/dL (ref 0.2–1.2)
Total Protein: 6.2 g/dL (ref 6.1–8.1)
eGFR: 100 mL/min/{1.73_m2} (ref >=60)

## 2023-04-09 LAB — CBC
HCT: 39.4 % (ref 35.0–45.0)
Hemoglobin: 13 g/dL (ref 11.7–15.5)
MCH: 32.7 pg (ref 27.0–33.0)
MCHC: 33 g/dL (ref 32.0–36.0)
MCV: 99 fL (ref 80.0–100.0)
MPV: 9.9 fL (ref 7.5–12.5)
Platelets: 205 10*3/uL (ref 140–400)
RBC: 3.98 10*6/uL (ref 3.80–5.10)
RDW: 12.1 % (ref 11.0–15.0)
WBC: 3.6 10*3/uL — ABNORMAL LOW (ref 3.8–10.8)

## 2023-04-09 LAB — HEMOGLOBIN A1C
Hgb A1c MFr Bld: 5.4 %{Hb} (ref <5.7)
Mean Plasma Glucose: 108 mg/dL
eAG (mmol/L): 6 mmol/L

## 2023-04-09 LAB — LIPID PANEL
Cholesterol: 201 mg/dL — ABNORMAL HIGH (ref <200)
HDL: 62 mg/dL (ref >=50)
LDL Cholesterol (Calc): 111 mg/dL — ABNORMAL HIGH
Non-HDL Cholesterol (Calc): 139 mg/dL — ABNORMAL HIGH (ref <130)
Total CHOL/HDL Ratio: 3.2 (calc) (ref <5.0)
Triglycerides: 165 mg/dL — ABNORMAL HIGH (ref <150)

## 2023-04-09 LAB — VITAMIN D 25 HYDROXY (VIT D DEFICIENCY, FRACTURES): Vit D, 25-Hydroxy: 23 ng/mL — ABNORMAL LOW (ref 30–100)

## 2023-04-09 LAB — TSH: TSH: 2.53 m[IU]/L (ref 0.40–4.50)

## 2023-04-09 LAB — T4, FREE: Free T4: 0.9 ng/dL (ref 0.8–1.8)

## 2023-04-12 ENCOUNTER — Other Ambulatory Visit: Payer: Self-pay | Admitting: Internal Medicine

## 2023-04-12 MED ORDER — TRAMADOL HCL 50 MG PO TABS: 50.0000 mg | ORAL_TABLET | Freq: Every day | ORAL | 0 refills | Status: DC | PRN

## 2023-04-30 ENCOUNTER — Other Ambulatory Visit: Payer: Self-pay | Admitting: Internal Medicine

## 2023-04-30 MED ORDER — ESCITALOPRAM OXALATE 20 MG PO TABS: 20.0000 mg | ORAL_TABLET | Freq: Every day | ORAL | 1 refills | Status: DC

## 2023-05-18 ENCOUNTER — Other Ambulatory Visit: Payer: Self-pay | Admitting: Internal Medicine

## 2023-05-18 MED ORDER — TRAMADOL HCL 50 MG PO TABS: 50.0000 mg | ORAL_TABLET | Freq: Every day | ORAL | 0 refills | Status: DC | PRN

## 2023-05-26 ENCOUNTER — Ambulatory Visit
Admission: RE | Admit: 2023-05-26 | Discharge: 2023-05-26 | Disposition: A | Discharge status: Home / Self Care | Payer: BC Managed Care – PPO | Source: Ambulatory Visit | Attending: Internal Medicine | Admitting: Internal Medicine

## 2023-05-26 DIAGNOSIS — M8588 Other specified disorders of bone density and structure, other site: Secondary | ICD-10-CM | POA: Diagnosis not present

## 2023-05-26 DIAGNOSIS — Z1231 Encounter for screening mammogram for malignant neoplasm of breast: Secondary | ICD-10-CM | POA: Diagnosis not present

## 2023-05-26 DIAGNOSIS — Z780 Asymptomatic menopausal state: Secondary | ICD-10-CM | POA: Insufficient documentation

## 2023-05-27 ENCOUNTER — Encounter: Payer: Self-pay | Admitting: Internal Medicine

## 2023-05-31 ENCOUNTER — Encounter: Payer: Self-pay | Admitting: Internal Medicine

## 2023-06-11 ENCOUNTER — Telehealth: Payer: Self-pay

## 2023-06-11 NOTE — Telephone Encounter (Signed)
Ok for generic

## 2023-06-11 NOTE — Telephone Encounter (Signed)
 Received fax from [facility] pharmacy for clarification - asking if we can switch generics? Thanks!

## 2023-06-11 NOTE — Telephone Encounter (Signed)
 Pharmacy advised

## 2023-06-23 ENCOUNTER — Ambulatory Visit: Payer: Self-pay | Admitting: Internal Medicine

## 2023-06-23 NOTE — Telephone Encounter (Signed)
Chief Complaint: finger pain Symptoms: pain, swelling, and bruising of L index finger Frequency: yesterday AM Pertinent Negatives: Patient denies loss of sensation Disposition: [] ED /[x] Urgent Care (no appt availability in office) / [] Appointment(In office/virtual)/ []  [facility]/ [] Home Care/ [x] Refused Recommended Disposition /[] [facility]/ []  Follow-up with PCP Additional Notes: Pt reports "I hit my finger against something yesterday morning." Pt reports pain in the L index finger as well as swelling and bruising. States bruising was evident immediately after the injury. Endorses 5/10 pain with movement or making a fist. Still able to move finger. No numbness or loss of sensation. Reports bruising is blue and red. Pt is a Equities trader and calls from work. RN advised pt  she should be seen within 24 hours for possible fracture. No availability at the office d/t weather. RN advised pt go to UC because a virtual appt would not be appropriate. RN offered a few Urgent Cares near pt's work. Pt said she is very busy today at work and would be unable to leave work today. Pt states she may just find a splint and splint it. RN advised pt she needs to go to the ED if she finds the skin of the finger is white, if she is no longer able to move that finger, or she has numbness/loss of sensation, to which she verbalized understanding. RN advised pt she can always call us back here if she wants to try to schedule again.   Copied from CRM [id number]. Topic: Clinical - Red Word Triage >> Jun 23, 2023 11:26 AM Carlatta H wrote: [facility] that prompted transfer to Nurse Triage: Left index finger maybe broken//Discolored and swollen Reason for Disposition  Large swelling or bruise  Answer Assessment - Initial Assessment Questions 1. MECHANISM: "How did the injury happen?"      "Hit it against something yesterday AM" 2. ONSET: "When did the injury happen?" (Minutes or hours ago)      Yesterday  AM 3. LOCATION: "What part of the finger is injured?" "Is the nail damaged?"      "From my second knuckle down" 4. APPEARANCE of the INJURY: "What does the injury look like?"      "Turned red or purple immediately", swelling  5. 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 SIZE: For cuts, bruises, or swelling, ask: "How large is it?" (e.g., inches or centimeters;  entire finger)      No open skin, but blue and red color, "almost looks like a burn" 7. PAIN: "Is there pain?" If Yes, ask: "How bad is the pain?"    (e.g., Scale 1-10; or mild, moderate, severe)  - NONE (0): no pain.  - MILD (1-3): doesn't interfere with normal activities.   - MODERATE (4-7): interferes with normal activities or awakens from sleep.  - SEVERE (8-10): excruciating pain, unable to hold a glass of water or bend finger even a little.     Hurts most when she moves it and makes a fist, 5/10 pain 8. TETANUS: For any breaks in the skin, ask: "When was the last tetanus booster?"     No breaks in the skin 9. OTHER SYMPTOMS: "Do you have any other symptoms?"     No numbness/tingling/loss of sensation, but tender to the touch. Taking Tramadol for pain (  prescribed daily) which she states is helping.  Protocols used: Finger Injury-A-AH

## 2023-06-24 ENCOUNTER — Other Ambulatory Visit: Payer: Self-pay | Admitting: Internal Medicine

## 2023-06-24 MED ORDER — TRAMADOL HCL 50 MG PO TABS: 50.0000 mg | ORAL_TABLET | Freq: Every day | ORAL | 0 refills | Status: DC | PRN

## 2023-06-24 NOTE — Telephone Encounter (Signed)
Spoke with patient, she has not went to ED or urgent care. Advised her again to go to one or the other for proper treatment and splinting. She stated she did purchase a splint and it feels better but still discolored

## 2023-07-12 ENCOUNTER — Other Ambulatory Visit: Payer: Self-pay | Admitting: Internal Medicine

## 2023-07-12 DIAGNOSIS — F339 Major depressive disorder, recurrent, unspecified: Secondary | ICD-10-CM

## 2023-07-13 NOTE — Telephone Encounter (Signed)
 Requested Prescriptions  Refused Prescriptions Disp Refills   buPROPion (WELLBUTRIN XL) 150 MG 24 hr tablet [Pharmacy Med Name: buPROPion HCl ER (XL) 150 MG Oral Tablet Extended Release 24 Hour] 90 tablet 0    Sig: Take 1 tablet by mouth once daily     Psychiatry: Antidepressants - bupropion Passed - 07/13/2023 11:25 AM      Passed - Cr in normal range and within 360 days    Creat  Date Value Ref Range Status  04/08/2023 0.68 0.50 - 1.03 mg/dL Final         Passed - AST in normal range and within 360 days    AST  Date Value Ref Range Status  04/08/2023 18 10 - 35 U/L Final         Passed - ALT in normal range and within 360 days    ALT  Date Value Ref Range Status  04/08/2023 13 6 - 29 U/L Final         Passed - Completed PHQ-2 or PHQ-9 in the last 360 days      Passed - Last BP in normal range    BP Readings from Last 1 Encounters:  04/08/23 118/72         Passed - Valid encounter within last 6 months    Recent Outpatient Visits           3 months ago Acquired hypothyroidism   [place] [facility] [place], Salvadore Oxford, NP   7 months ago Depression, recurrent [facility])   [place] [facility] [place], Salvadore Oxford, NP   8 months ago Acute cough   [place] [facility] [place], Salvadore Oxford, NP   9 months ago Encounter for general adult medical examination with abnormal findings   [place] [facility] [place], Salvadore Oxford, NP   10 months ago Peripheral edema   [place] [facility] [place], Salvadore Oxford, NP       Future Appointments             In 2 months [place], Salvadore Oxford, NP [place] [facility], [facility]

## 2023-08-02 ENCOUNTER — Other Ambulatory Visit: Payer: Self-pay | Admitting: Internal Medicine

## 2023-08-02 MED ORDER — TRAMADOL HCL 50 MG PO TABS: 50.0000 mg | ORAL_TABLET | Freq: Every day | ORAL | 0 refills | Status: DC | PRN

## 2023-08-12 ENCOUNTER — Encounter: Payer: Self-pay | Admitting: Internal Medicine

## 2023-08-12 ENCOUNTER — Ambulatory Visit: Admitting: Internal Medicine

## 2023-08-12 VITALS — BP 118/70 | Ht 63.0 in | Wt 182.6 lb

## 2023-08-12 DIAGNOSIS — R21 Rash and other nonspecific skin eruption: Secondary | ICD-10-CM

## 2023-08-12 DIAGNOSIS — R221 Localized swelling, mass and lump, neck: Secondary | ICD-10-CM

## 2023-08-12 NOTE — Progress Notes (Signed)
 Subjective:    Patient ID: Brenda Grimes, female    DOB: 03-May-1966, 58 y.o.   MRN: [id number]  HPI  Discussed the use of AI scribe software for clinical note transcription with the patient, who gave verbal consent to proceed.  Brenda Grimes is a 58 year old female who presents with a knot on the left side of her neck.  Approximately two weeks ago, she noticed a knot on the left side of her neck. This has occurred before and resolved spontaneously, but it has now returned. The knot has not increased in size and is not associated with redness or drainage. No symptoms such as runny nose, nasal congestion, ear pain, sore throat, or persistent cough are present, except for a 'pollen cough'. She has not taken any medication for the knot.  She is concerned about persistent facial redness, which she describes as irritating but not burning. There is a family history of lupus, as her niece has been diagnosed with the condition. She has a known vitamin D deficiency. An autoimmune workup in the past was negative, but lupus-specific tests have not been conducted.     Review of Systems     Past Medical History:  Diagnosis Date   anal cancer    anal cancer , underwent radiation and chemo   Arthritis    CAD (coronary artery disease)    s/p stenting x 2   Colon polyp    Depression    Hyperlipidemia    MI (myocardial infarction) (HCC)    Thyroid disease    Vitamin D deficiency     Current Outpatient Medications  Medication Sig Dispense Refill   acetaminophen (TYLENOL) 500 MG tablet Take 1,000 mg by mouth every 6 (six) hours as needed.     aspirin EC 81 MG tablet Take 1 tablet (81 mg total) by mouth daily. Swallow whole. 30 tablet 12   cyclobenzaprine (FLEXERIL) 10 MG tablet Take 1 tablet by oral route at bedtime. 30 tablet 0   escitalopram (LEXAPRO) 20 MG tablet Take 1 tablet (20 mg total) by mouth daily. 90 tablet 1   hydrochlorothiazide (MICROZIDE) 12.5 MG capsule Take 1 capsule (12.5 mg  total) by mouth daily as needed. 30 capsule 0   ibuprofen (ADVIL) 200 MG tablet Take 200 mg by mouth every 6 (six) hours as needed.     levothyroxine (SYNTHROID) 88 MCG tablet TAKE 1 TABLET BY MOUTH ONCE DAILY BEFORE BREAKFAST 90 tablet 2   loperamide (IMODIUM) 2 MG capsule Take 2 mg by mouth as needed for diarrhea or loose stools.     nitroGLYCERIN (NITROSTAT) 0.4 MG SL tablet Place under the tongue.     omeprazole (PRILOSEC OTC) 20 MG tablet Take 1 tablet (20 mg total) by mouth daily.     ondansetron (ZOFRAN) 4 MG tablet Take 1 tablet (4 mg total) by mouth every 8 (eight) hours as needed for nausea or vomiting. 20 tablet 0   simvastatin (ZOCOR) 10 MG tablet Take 1 tablet (10 mg total) by mouth at bedtime. 90 tablet 1   traMADol (ULTRAM) 50 MG tablet Take 1 tablet (50 mg total) by mouth daily as needed. 30 tablet 0   No current facility-administered medications for this visit.    Allergies  Allergen Reactions   Sulfa Antibiotics Rash   Sulfasalazine Rash   Tape Rash    Family History  Problem Relation Age of Onset   Alzheimer's disease Mother    CAD Father    Rheum arthritis  Father    Osteoporosis Father    Thyroid disease Brother    Hyperlipidemia Brother    Hyperlipidemia Brother    Alcohol abuse Brother    Thyroid disease Brother    Cirrhosis Brother    Alzheimer's disease Maternal Grandmother    Heart disease Paternal Grandmother    Heart disease Paternal Grandfather    Healthy Daughter    Healthy Son    Healthy Son    Cancer Paternal Aunt        Possible colon cancer   Heart disease Paternal Uncle    Breast cancer Cousin     Social History   Socioeconomic History   Marital status: Married    Spouse name: Not on file   Number of children: Not on file   Years of education: Not on file   Highest education level: Not on file  Occupational History   Not on file  Tobacco Use   Smoking status: Former    Current packs/day: 0.00    Average packs/day: 0.8  packs/day for 30.0 years (22.5 ttl pk-yrs)    Types: Cigarettes    Start date: 01/20/1977    Quit date: 01/21/2007    Years since quitting: 16.5   Smokeless tobacco: Never  Vaping Use   Vaping status: Never Used  Substance and Sexual Activity   Alcohol use: No   Drug use: No   Sexual activity: Not Currently    Birth control/protection: None  Other Topics Concern   Not on file  Social History Narrative   Not on file   Social Drivers of Health   Financial Resource Strain: Low Risk  (12/11/2022)   Overall Financial Resource Strain (CARDIA)    Difficulty of Paying Living Expenses: Not very hard  Food Insecurity: No Food Insecurity (12/11/2022)   Hunger Vital Sign    Worried About Running Out of Food in the Last Year: Never true    Ran Out of Food in the Last Year: Never true  Transportation Needs: No Transportation Needs (12/11/2022)   PRAPARE - Administrator, Civil Service (Medical): No    Lack of Transportation (Non-Medical): No  Physical Activity: Insufficiently Active (12/11/2022)   Exercise Vital Sign    Days of Exercise per Week: 2 days    Minutes of Exercise per Session: 20 min  Stress: No Stress Concern Present (12/11/2022)   [employer] of Occupational Health - Occupational Stress Questionnaire    Feeling of Stress : Only a little  Social Connections: Moderately Isolated (12/11/2022)   Social Connection and Isolation Panel [NHANES]    Frequency of Communication with Friends and Family: More than three times a week    Frequency of Social Gatherings with Friends and Family: More than three times a week    Attends Religious Services: Never    Database administrator or Organizations: No    Attends Banker Meetings: Never    Marital Status: Married  Catering manager Violence: Not At Risk (12/11/2022)   Humiliation, Afraid, Rape, and Kick questionnaire    Fear of Current or Ex-Partner: No    Emotionally Abused: No    Physically Abused: No    Sexually  Abused: No     Constitutional: Denies fever, malaise, fatigue, headache or abrupt weight changes.  HEENT: Denies eye pain, eye redness, ear pain, ringing in the ears, wax buildup, runny nose, nasal congestion, bloody nose, or sore throat. Respiratory: Pt reports cough. Denies difficulty breathing, shortness of breath,  or sputum production.   Neck: Patient reports lymphadenopathy. Cardiovascular: Denies chest pain, chest tightness, palpitations or swelling in the hands or feet.  Gastrointestinal: Patient reports chronic abdominal pain and diarrhea.  Denies bloating, constipation,or blood in the stool.  GU: Denies urgency, frequency, pain with urination, burning sensation, blood in urine, odor or discharge. Musculoskeletal: Patient reports chronic joint pain.  Denies decrease in range of motion, difficulty with gait, muscle pain or joint swelling.  Skin: Pt reports redness of face. Denies rashes, lesions or ulcercations.  Neurological: Denies dizziness, difficulty with memory, difficulty with speech or problems with balance and coordination.  Psych: Patient has a history of depression.  Denies anxiety, SI/HI.  No other specific complaints in a complete review of systems (except as listed in HPI above).  Objective:   Physical Exam  BP 118/70 (BP Location: Left Arm, Patient Position: Sitting, Cuff Size: Normal)   Ht 5\' 3"  (1.6 m)   Wt 182 lb 9.6 oz (82.8 kg)   BMI 32.35 kg/m     Wt Readings from Last 3 Encounters:  04/08/23 185 lb 9.6 oz (84.2 kg)  11/11/22 184 lb (83.5 kg)  10/06/22 183 lb (83 kg)    General: Appears her stated age, obese in NAD. Skin: Warm, dry and intact.  Redness noted of central forehead, nasal bridge and bilateral cheeks. Neck:  Neck supple, trachea midline.  Fullness noted of the left medial portion of the neck posterior to the sternocleidomastoid.  No masses, lumps or thyromegaly present.  Cardiovascular: Normal rate and rhythm. S1,S2 noted.  No murmur, rubs  or gallops noted.  Pulmonary/Chest: Normal effort and positive vesicular breath sounds. No respiratory distress. No wheezes, rales or ronchi noted.  Musculoskeletal:  No difficulty with gait.  Neurological: Alert and oriented.  Coordination normal.     BMET    Component Value Date/Time   NA 143 04/08/2023 0932   K 4.6 04/08/2023 0932   CL 107 04/08/2023 0932   CO2 30 04/08/2023 0932   GLUCOSE 92 04/08/2023 0932   BUN 16 04/08/2023 0932   CREATININE 0.68 04/08/2023 0932   CALCIUM 8.5 (L) 04/08/2023 0932   GFRNONAA >60 08/08/2021 1509   GFRNONAA 79 04/04/2020 0904   GFRAA 91 04/04/2020 0904    Lipid Panel     Component Value Date/Time   CHOL 201 (H) 04/08/2023 0932   TRIG 165 (H) 04/08/2023 0932   HDL 62 04/08/2023 0932   CHOLHDL 3.2 04/08/2023 0932   LDLCALC 111 (H) 04/08/2023 0932    CBC    Component Value Date/Time   WBC 3.6 (L) 04/08/2023 0932   RBC 3.98 04/08/2023 0932   HGB 13.0 04/08/2023 0932   HCT 39.4 04/08/2023 0932   PLT 205 04/08/2023 0932   MCV 99.0 04/08/2023 0932   MCH 32.7 04/08/2023 0932   MCHC 33.0 04/08/2023 0932   RDW 12.1 04/08/2023 0932   LYMPHSABS 921 07/24/2021 1010   MONOABS 0.3 04/15/2020 1209   EOSABS 60 07/24/2021 1010   BASOSABS 39 07/24/2021 1010    Hgb A1C Lab Results  Component Value Date   HGBA1C 5.4 04/08/2023            Assessment & Plan:   Assessment and Plan    Facial redness Persistent facial redness with concern for lupus due to family history and symptoms, including vitamin D deficiency. Agreed to perform a lupus workup to rule out the condition. If negative, consider rosacea. - Order lupus workup including C3 and C4  complement, antiphospholipid antibody, anti-double-stranded DNA antibody, ANA, CRP, and ESR. - Consider rosacea if lupus workup is negative.  Neck fullness Knot on the left side of the neck with no increase in size, redness, drainage, or pain. Examination shows no discrete mass, lymph node, or  muscle involvement. Possible muscle hypertrophy or fat buildup, not concerning. - Monitor the neck area for any changes in size or pain. - Reevaluate at the next visit if there are any changes.    RTC in 2 months for your annual exam Nicki Reaper, NP

## 2023-08-12 NOTE — Patient Instructions (Signed)
 Lupus Anticoagulant Panel Test Why am I having this test? The lupus anticoagulant panel test can be used to find out why your blood is not clotting like it should. You may need this test if: You have had miscarriages, preterm labor, or high blood pressure in pregnancy (preeclampsia). Your blood clotting time is longer than it should be. You have or have had thrombocytopenia. This is when there are too few platelets in your blood. You may have antiphospholipid syndrome. This is when your body attacks phospholipids by mistake. Phospholipids are a normal part of many types of cells. You have or may have systemic lupus erythematosus (SLE or lupus). You may have some type of rheumatic disease. What is being tested? This test checks your blood for proteins called lupus anticoagulant autoantibodies. These autoantibodies are made by your body's defense system (immune system) to help fight off bacteria and viruses in abnormal tissue. In some cases, the body may attack normal tissue by mistake. This is called an autoimmune response. If your body attacks its own platelets, it may affect the way that your blood clots. This puts you more at risk for getting blood clots in your arteries and veins. It also puts you more at risk for having a heart attack or stroke. What kind of sample is taken?  A blood sample is required for this test. In most cases, it is collected by putting a needle into a blood vessel or by sticking a finger with a small needle. Tell a health care provider about: All medicines you are taking, including vitamins, herbs, eye drops, creams, and over-the-counter medicines. Any medical conditions you have. How are the results reported? Your test results will be reported as values. Your health care provider will compare your results to normal ranges that were established after testing a large group of people (reference ranges). Reference ranges may vary among labs and hospitals. What do the  results mean? If your results are higher than the reference ranges, you may have: Lupus. Antiphospholipid syndrome. Talk with your health care provider about what your results mean. Questions to ask your health care provider Ask your health care provider, or the department that is doing the test: When will my results be ready? How will I get my results? What are my treatment options? What other tests do I need? What are my next steps? This information is not intended to replace advice given to you by your health care provider. Make sure you discuss any questions you have with your health care provider. Document Revised: 11/11/2021 Document Reviewed: 11/11/2021 Elsevier Patient Education  2024 [employer].

## 2023-08-13 ENCOUNTER — Encounter: Payer: Self-pay | Admitting: Internal Medicine

## 2023-08-17 LAB — ANTIPHOSPHOLIPID SYNDROME DIAGNOSTIC PANEL
Anticardiolipin IgA: <2 [APL'U]/mL (ref <20.0)
Anticardiolipin IgG: <2 [GPL'U]/mL (ref <20.0)
Anticardiolipin IgM: <2 [MPL'U]/mL (ref <20.0)
Beta-2 Glyco 1 IgA: <2 U/mL (ref <20.0)
Beta-2 Glyco 1 IgM: <2 U/mL (ref <20.0)
Beta-2 Glyco I IgG: <2 U/mL (ref <20.0)
PTT-LA Screen: 33 s (ref <=40)
dRVVT: 35 s (ref <=45)

## 2023-08-17 LAB — C3 AND C4
C3 Complement: 149 mg/dL (ref 83–193)
C4 Complement: 24 mg/dL (ref 15–57)

## 2023-08-17 LAB — ANA: Anti Nuclear Antibody (ANA): NEGATIVE

## 2023-08-17 LAB — SEDIMENTATION RATE: Sed Rate: 9 mm/h (ref 0–30)

## 2023-08-17 LAB — C-REACTIVE PROTEIN: CRP: <3 mg/L (ref <8.0)

## 2023-08-17 LAB — ANTI-DNA ANTIBODY, DOUBLE-STRANDED: ds DNA Ab: <1 [IU]/mL

## 2023-08-24 ENCOUNTER — Encounter: Payer: Self-pay | Admitting: Internal Medicine

## 2023-08-24 MED ORDER — VITAMIN D (ERGOCALCIFEROL) 1.25 MG (50000 UNIT) PO CAPS: 50000.0000 [IU] | ORAL_CAPSULE | ORAL | 0 refills | Status: DC

## 2023-08-24 NOTE — Addendum Note (Signed)
 Addended by: Carollynn Cirri on: 08/24/2023 10:45 AM   Modules accepted: Orders

## 2023-09-07 ENCOUNTER — Ambulatory Visit (INDEPENDENT_AMBULATORY_CARE_PROVIDER_SITE_OTHER)

## 2023-09-07 ENCOUNTER — Encounter: Payer: Self-pay | Admitting: Podiatry

## 2023-09-07 ENCOUNTER — Ambulatory Visit (INDEPENDENT_AMBULATORY_CARE_PROVIDER_SITE_OTHER): Admitting: Podiatry

## 2023-09-07 DIAGNOSIS — M778 Other enthesopathies, not elsewhere classified: Secondary | ICD-10-CM | POA: Diagnosis not present

## 2023-09-07 DIAGNOSIS — M2011 Hallux valgus (acquired), right foot: Secondary | ICD-10-CM

## 2023-09-07 DIAGNOSIS — M2012 Hallux valgus (acquired), left foot: Secondary | ICD-10-CM

## 2023-09-07 MED ORDER — BETAMETHASONE SOD PHOS & ACET 6 (3-3) MG/ML IJ SUSP
3.0000 mg | Freq: Once | INTRAMUSCULAR | Status: AC
  Administered 2023-09-07: 3 mg via INTRA_ARTICULAR

## 2023-09-07 NOTE — Progress Notes (Signed)
 Chief Complaint  Patient presents with   Foot Pain    "These knots are starting to hurt." N - knots hurt L - hallux and 5th met bilateral D - >1 year O - suddenly, progressively worse, Rt>Lt C - sharp pain, ache A - Huskins that is too small T - brought wider shoes, Wrap with an ace bandage    HPI: 58 y.o. female presenting today concerned about possible deformity to her bilateral feet.  She says that her father had history of gout and terrible feet which is very debilitating and his elderly age.  She is concerned is interested in preventative measures to ensure that she does not have any foot issues as she gets older  She has also been experiencing pain especially in the evenings to the anterolateral aspect of the right foot.  No history of injury.  She has not anything for treatment.  She notices sharp shooting pain especially at night when she tries to go to bed  Past Medical History:  Diagnosis Date   anal cancer    anal cancer , underwent radiation and chemo   Arthritis    CAD (coronary artery disease)    s/p stenting x 2   Colon polyp    Depression    Hyperlipidemia    MI (myocardial infarction) (HCC)    Thyroid  disease    Vitamin D  deficiency     Past Surgical History:  Procedure Laterality Date   CARDIAC SURGERY  2010   2 stents   COLONOSCOPY WITH PROPOFOL  N/A 04/17/2021   Procedure: COLONOSCOPY WITH BIOPSY;  Surgeon: Selena Daily, MD;  Location: [facility];  Service: Endoscopy;  Laterality: N/A;   CORONARY ANGIOPLASTY WITH STENT PLACEMENT     POLYPECTOMY N/A 04/17/2021   Procedure: POLYPECTOMY;  Surgeon: Selena Daily, MD;  Location: [facility];  Service: Endoscopy;  Laterality: N/A;   TUBAL LIGATION      Allergies  Allergen Reactions   Sulfa Antibiotics Rash   Sulfasalazine Rash   Tape Rash     Physical Exam: General: The patient is alert and oriented x3 in no acute distress.  Dermatology: Skin is warm, dry and supple  bilateral lower extremities.   Vascular: Palpable pedal pulses bilaterally. Capillary refill within normal limits.  No appreciable edema.  No erythema.  Neurological: Grossly intact via light touch  Musculoskeletal Exam: No pedal deformities noted.  There is some tenderness to palpation along the anterior lateral aspect of the TMT right foot  Radiographic Exam B/L feet 09/07/2023:  Normal osseous mineralization. Joint spaces preserved.  No fractures or osseous irregularities noted.  Impression: Negative  Assessment/Plan of Care: 1.  Capsulitis anterolateral aspect of the right foot TMT  -Patient evaluated.  X-rays reviewed -Injection of 0.5 cc Celestone  Soluspan injected around the anterior lateral aspect of the right foot at the level of the TMT -Recommend topical Voltaren gel as needed -Continue wearing good supportive tennis shoes that are wide fitting and do not constrict the toebox area -Patient still wears arch supports.  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 Dot Gazella, DPM    [address]  [place], [place] [address]                Office [phone]  Fax [phone]

## 2023-09-09 ENCOUNTER — Other Ambulatory Visit: Payer: Self-pay | Admitting: Internal Medicine

## 2023-09-09 MED ORDER — TRAMADOL HCL 50 MG PO TABS: 50.0000 mg | ORAL_TABLET | Freq: Every day | ORAL | 0 refills | Status: DC | PRN

## 2023-10-07 ENCOUNTER — Encounter: Payer: Self-pay | Admitting: Internal Medicine

## 2023-10-07 NOTE — Progress Notes (Deleted)
 Subjective:    Patient ID: Brenda Grimes, female    DOB: May 18, 1965, 58 y.o.   MRN: [id number]  HPI  Patient presents to clinic today for her annual exam.  Flu: 04/2023 Tetanus: 09/2021 COVID: Moderna x 2 Prevnar: Never Shingrix: Never Pap smear: 02/2021 Mammogram: 05/2023 Bone density: 05/2023 Colon screening: 04/2021 Vision screening: annually Dentist: biannually  Diet: She does eat meat. She consumes fruits and veggies. She does eat some fried foods. She drinks mostly Sprite. Exercise: None   Review of Systems     Past Medical History:  Diagnosis Date   anal cancer    anal cancer , underwent radiation and chemo   Arthritis    CAD (coronary artery disease)    s/p stenting x 2   Colon polyp    Depression    Hyperlipidemia    MI (myocardial infarction) (HCC)    Thyroid  disease    Vitamin D  deficiency     Current Outpatient Medications  Medication Sig Dispense Refill   acetaminophen (TYLENOL) 500 MG tablet Take 1,000 mg by mouth every 6 (six) hours as needed.     aspirin  EC 81 MG tablet Take 1 tablet (81 mg total) by mouth daily. Swallow whole. 30 tablet 12   Cholecalciferol (VITAMIN D -3 PO) Take by mouth.     Cyanocobalamin  (VITAMIN B-12 PO) Take by mouth.     cyclobenzaprine  (FLEXERIL ) 10 MG tablet Take 1 tablet by oral route at bedtime. (Patient not taking: Reported on 08/12/2023) 30 tablet 0   escitalopram  (LEXAPRO ) 20 MG tablet Take 1 tablet (20 mg total) by mouth daily. 90 tablet 1   hydrochlorothiazide  (MICROZIDE ) 12.5 MG capsule Take 1 capsule (12.5 mg total) by mouth daily as needed. 30 capsule 0   ibuprofen (ADVIL) 200 MG tablet Take 200 mg by mouth every 6 (six) hours as needed.     levothyroxine  (SYNTHROID ) 88 MCG tablet TAKE 1 TABLET BY MOUTH ONCE DAILY BEFORE BREAKFAST 90 tablet 2   loperamide (IMODIUM) 2 MG capsule Take 2 mg by mouth as needed for diarrhea or loose stools.     nitroGLYCERIN  (NITROSTAT ) 0.4 MG SL tablet Place under the tongue.      omeprazole  (PRILOSEC  OTC) 20 MG tablet Take 1 tablet (20 mg total) by mouth daily.     ondansetron  (ZOFRAN ) 4 MG tablet Take 1 tablet (4 mg total) by mouth every 8 (eight) hours as needed for nausea or vomiting. 20 tablet 0   simvastatin  (ZOCOR ) 10 MG tablet Take 1 tablet (10 mg total) by mouth at bedtime. 90 tablet 1   traMADol  (ULTRAM ) 50 MG tablet Take 1 tablet (50 mg total) by mouth daily as needed. 30 tablet 0   Vitamin D , Ergocalciferol , (DRISDOL ) 1.25 MG (50000 UNIT) CAPS capsule Take 1 capsule (50,000 Units total) by mouth once a week. For 12 weeks. Then start OTC Vitamin D3 2,000 unit daily. 12 capsule 0   No current facility-administered medications for this visit.    Allergies  Allergen Reactions   Sulfa Antibiotics Rash   Sulfasalazine Rash   Tape Rash    Family History  Problem Relation Age of Onset   Alzheimer's disease Mother    CAD Father    Rheum arthritis Father    Osteoporosis Father    Thyroid  disease Brother    Hyperlipidemia Brother    Hyperlipidemia Brother    Alcohol abuse Brother    Thyroid  disease Brother    Cirrhosis Brother    Alzheimer's disease Maternal  Grandmother    Heart disease Paternal Grandmother    Heart disease Paternal Grandfather    Healthy Daughter    Healthy Son    Healthy Son    Cancer Paternal Aunt        Possible colon cancer   Heart disease Paternal Uncle    Breast cancer Cousin     Social History   Socioeconomic History   Marital status: Married    Spouse name: Not on file   Number of children: Not on file   Years of education: Not on file   Highest education level: Not on file  Occupational History   Not on file  Tobacco Use   Smoking status: Former    Current packs/day: 0.00    Average packs/day: 0.8 packs/day for 30.0 years (22.5 ttl pk-yrs)    Types: Cigarettes    Start date: 01/20/1977    Quit date: 01/21/2007    Years since quitting: 16.7   Smokeless tobacco: Never  Vaping Use   Vaping status: Never Used   Substance and Sexual Activity   Alcohol use: No   Drug use: No   Sexual activity: Not Currently    Birth control/protection: None  Other Topics Concern   Not on file  Social History Narrative   Not on file   Social Drivers of Health   Financial Resource Strain: Low Risk  (12/11/2022)   Overall Financial Resource Strain (CARDIA)    Difficulty of Paying Living Expenses: Not very hard  Food Insecurity: No Food Insecurity (12/11/2022)   Hunger Vital Sign    Worried About Running Out of Food in the Last Year: Never true    Ran Out of Food in the Last Year: Never true  Transportation Needs: No Transportation Needs (12/11/2022)   PRAPARE - Administrator, Civil Service (Medical): No    Lack of Transportation (Non-Medical): No  Physical Activity: Insufficiently Active (12/11/2022)   Exercise Vital Sign    Days of Exercise per Week: 2 days    Minutes of Exercise per Session: 20 min  Stress: No Stress Concern Present (12/11/2022)   [employer] of Occupational Health - Occupational Stress Questionnaire    Feeling of Stress : Only a little  Social Connections: Moderately Isolated (12/11/2022)   Social Connection and Isolation Panel [NHANES]    Frequency of Communication with Friends and Family: More than three times a week    Frequency of Social Gatherings with Friends and Family: More than three times a week    Attends Religious Services: Never    Database administrator or Organizations: No    Attends Banker Meetings: Never    Marital Status: Married  Catering manager Violence: Not At Risk (12/11/2022)   Humiliation, Afraid, Rape, and Kick questionnaire    Fear of Current or Ex-Partner: No    Emotionally Abused: No    Physically Abused: No    Sexually Abused: No     Constitutional: Denies fever, malaise, fatigue, headache or abrupt weight changes.  HEENT: Denies eye pain, eye redness, ear pain, ringing in the ears, wax buildup, runny nose, nasal congestion,  bloody nose, or sore throat. Respiratory: Denies difficulty breathing, shortness of breath, cough or sputum production.   Cardiovascular: Denies chest pain, chest tightness, palpitations or swelling in the hands or feet.  Gastrointestinal: Patient reports diarrhea.  Denies abdominal pain, bloating, constipation,  or blood in the stool.  GU: Denies urgency, frequency, pain with urination, burning sensation, blood  in urine, odor or discharge. Musculoskeletal: Patient reports chronic back pain, joint pain in hands.  Denies decrease in range of motion, difficulty with gait, or joint swelling.  Skin: Denies redness, rashes, lesions or ulcercations.  Neurological: Patient reports hot flashes.  Denies dizziness, difficulty with memory, difficulty with speech or problems with balance and coordination.  Psych: Patient has a history of depression.  Denies anxiety, SI/HI.  No other specific complaints in a complete review of systems (except as listed in HPI above).  Objective:   Physical Exam   There were no vitals taken for this visit.  Wt Readings from Last 3 Encounters:  08/12/23 182 lb 9.6 oz (82.8 kg)  04/08/23 185 lb 9.6 oz (84.2 kg)  11/11/22 184 lb (83.5 kg)    General: Appears her stated age, obese, in NAD. Skin: Warm, dry and intact.  HEENT: Head: normal shape and size; Eyes: sclera white, no icterus, conjunctiva pink, PERRLA and EOMs intact;  Neck:  Neck supple, trachea midline. No masses, lumps present.  Cardiovascular: Normal rate and rhythm. S1,S2 noted.  No murmur, rubs or gallops noted. No JVD or BLE edema. No carotid bruits noted. Pulmonary/Chest: Normal effort and positive vesicular breath sounds. No respiratory distress. No wheezes, rales or ronchi noted.  Abdomen: Normal bowel sounds.  Musculoskeletal: Enlargement of the 1st and 2nd knuckles bilateral hands. Strength 5/5 BUE/BLE. No difficulty with gait.  Neurological: Alert and oriented. Cranial nerves II-XII grossly intact.  Coordination normal.  Psychiatric: Mood and affect normal. Behavior is normal. Judgment and thought content normal.     BMET    Component Value Date/Time   NA 143 04/08/2023 0932   K 4.6 04/08/2023 0932   CL 107 04/08/2023 0932   CO2 30 04/08/2023 0932   GLUCOSE 92 04/08/2023 0932   BUN 16 04/08/2023 0932   CREATININE 0.68 04/08/2023 0932   CALCIUM  8.5 (L) 04/08/2023 0932   GFRNONAA >60 08/08/2021 1509   GFRNONAA 79 04/04/2020 0904   GFRAA 91 04/04/2020 0904    Lipid Panel     Component Value Date/Time   CHOL 201 (H) 04/08/2023 0932   TRIG 165 (H) 04/08/2023 0932   HDL 62 04/08/2023 0932   CHOLHDL 3.2 04/08/2023 0932   LDLCALC 111 (H) 04/08/2023 0932    CBC    Component Value Date/Time   WBC 3.6 (L) 04/08/2023 0932   RBC 3.98 04/08/2023 0932   HGB 13.0 04/08/2023 0932   HCT 39.4 04/08/2023 0932   PLT 205 04/08/2023 0932   MCV 99.0 04/08/2023 0932   MCH 32.7 04/08/2023 0932   MCHC 33.0 04/08/2023 0932   RDW 12.1 04/08/2023 0932   LYMPHSABS 921 07/24/2021 1010   MONOABS 0.3 04/15/2020 1209   EOSABS 60 07/24/2021 1010   BASOSABS 39 07/24/2021 1010    Hgb A1C Lab Results  Component Value Date   HGBA1C 5.4 04/08/2023           Assessment & Plan:   Preventative Health Maintenance:  Encouraged her to get a flu shot in the fall Tetanus UTD Encouraged her to get her COVID booster Prevnar Discussed Shingrix vaccine, she will check coverage with her insurance company and schedule visit if she would like to have this done Pap smear UTD Mammogram UTD Bone density UTD Colon screening UTD Encouraged her to consume a balanced diet and exercise regimen Advised her to see an eye doctor and dentist annually We will check CBC, c-Met, TSH, free T4, lipid, A1c today  RTC  in 6 months for follow-up of chronic conditions Helayne Lo, NP

## 2023-10-08 ENCOUNTER — Encounter: Payer: Self-pay | Admitting: Internal Medicine

## 2023-10-14 ENCOUNTER — Encounter: Payer: Self-pay | Admitting: Internal Medicine

## 2023-10-14 MED ORDER — TRAMADOL HCL 50 MG PO TABS: 50.0000 mg | ORAL_TABLET | Freq: Every day | ORAL | 0 refills | Status: DC | PRN

## 2023-10-19 ENCOUNTER — Encounter: Payer: Self-pay | Admitting: Internal Medicine

## 2023-10-19 ENCOUNTER — Ambulatory Visit (INDEPENDENT_AMBULATORY_CARE_PROVIDER_SITE_OTHER): Admitting: Internal Medicine

## 2023-10-19 VITALS — BP 110/60 | Ht 63.0 in | Wt 186.0 lb

## 2023-10-19 DIAGNOSIS — R739 Hyperglycemia, unspecified: Secondary | ICD-10-CM | POA: Diagnosis not present

## 2023-10-19 DIAGNOSIS — E039 Hypothyroidism, unspecified: Secondary | ICD-10-CM

## 2023-10-19 DIAGNOSIS — Z23 Encounter for immunization: Secondary | ICD-10-CM

## 2023-10-19 DIAGNOSIS — E785 Hyperlipidemia, unspecified: Secondary | ICD-10-CM | POA: Diagnosis not present

## 2023-10-19 DIAGNOSIS — E6609 Other obesity due to excess calories: Secondary | ICD-10-CM

## 2023-10-19 DIAGNOSIS — Z6832 Body mass index (BMI) 32.0-32.9, adult: Secondary | ICD-10-CM

## 2023-10-19 DIAGNOSIS — E66811 Obesity, class 1: Secondary | ICD-10-CM

## 2023-10-19 DIAGNOSIS — Z0001 Encounter for general adult medical examination with abnormal findings: Secondary | ICD-10-CM | POA: Diagnosis not present

## 2023-10-19 NOTE — Patient Instructions (Signed)
 Health Maintenance for Postmenopausal Women Menopause is a normal process in which your ability to get pregnant comes to an end. This process happens slowly over many months or years, usually between the ages of 24 and 62. Menopause is complete when you have missed your menstrual period for 12 months. It is important to talk with your health care provider about some of the most common conditions that affect women after menopause (postmenopausal women). These include heart disease, cancer, and bone loss (osteoporosis). Adopting a healthy lifestyle and getting preventive care can help to promote your health and wellness. The actions you take can also lower your chances of developing some of these common conditions. What are the signs and symptoms of menopause? During menopause, you may have the following symptoms: Hot flashes. These can be moderate or severe. Night sweats. Decrease in sex drive. Mood swings. Headaches. Tiredness (fatigue). Irritability. Memory problems. Problems falling asleep or staying asleep. Talk with your health care provider about treatment options for your symptoms. Do I need hormone replacement therapy? Hormone replacement therapy is effective in treating symptoms that are caused by menopause, such as hot flashes and night sweats. Hormone replacement carries certain risks, especially as you become older. If you are thinking about using estrogen or estrogen with progestin, discuss the benefits and risks with your health care provider. How can I reduce my risk for heart disease and stroke? The risk of heart disease, heart attack, and stroke increases as you age. One of the causes may be a change in the body's hormones during menopause. This can affect how your body uses dietary fats, triglycerides, and cholesterol. Heart attack and stroke are medical emergencies. There are many things that you can do to help prevent heart disease and stroke. Watch your blood pressure High  blood pressure causes heart disease and increases the risk of stroke. This is more likely to develop in people who have high blood pressure readings or are overweight. Have your blood pressure checked: Every 3-5 years if you are 50-75 years of age. Every year if you are 77 years old or older. Eat a healthy diet  Eat a diet that includes plenty of vegetables, fruits, low-fat dairy products, and lean protein. Do not eat a lot of foods that are high in solid fats, added sugars, or sodium. Get regular exercise Get regular exercise. This is one of the most important things you can do for your health. Most adults should: Try to exercise for at least 150 minutes each week. The exercise should increase your heart rate and make you sweat (moderate-intensity exercise). Try to do strengthening exercises at least twice each week. Do these in addition to the moderate-intensity exercise. Spend less time sitting. Even light physical activity can be beneficial. Other tips Work with your health care provider to achieve or maintain a healthy weight. Do not use any products that contain nicotine or tobacco. These products include cigarettes, chewing tobacco, and vaping devices, such as e-cigarettes. If you need help quitting, ask your health care provider. Know your numbers. Ask your health care provider to check your cholesterol and your blood sugar (glucose). Continue to have your blood tested as directed by your health care provider. Do I need screening for cancer? Depending on your health history and family history, you may need to have cancer screenings at different stages of your life. This may include screening for: Breast cancer. Cervical cancer. Lung cancer. Colorectal cancer. What is my risk for osteoporosis? After menopause, you may be  at increased risk for osteoporosis. Osteoporosis is a condition in which bone destruction happens more quickly than new bone creation. To help prevent osteoporosis or  the bone fractures that can happen because of osteoporosis, you may take the following actions: If you are 61-3 years old, get at least 1,000 mg of calcium and at least 600 international units (IU) of vitamin D per day. If you are older than age 61 but younger than age 75, get at least 1,200 mg of calcium and at least 600 international units (IU) of vitamin D per day. If you are older than age 62, get at least 1,200 mg of calcium and at least 800 international units (IU) of vitamin D per day. Smoking and drinking excessive alcohol increase the risk of osteoporosis. Eat foods that are rich in calcium and vitamin D, and do weight-bearing exercises several times each week as directed by your health care provider. How does menopause affect my mental health? Depression may occur at any age, but it is more common as you become older. Common symptoms of depression include: Feeling depressed. Changes in sleep patterns. Changes in appetite or eating patterns. Feeling an overall lack of motivation or enjoyment of activities that you previously enjoyed. Frequent crying spells. Talk with your health care provider if you think that you are experiencing any of these symptoms. General instructions See your health care provider for regular wellness exams and vaccines. This may include: Scheduling regular health, dental, and eye exams. Getting and maintaining your vaccines. These include: Influenza vaccine. Get this vaccine each year before the flu season begins. Pneumonia vaccine. Shingles vaccine. Tetanus, diphtheria, and pertussis (Tdap) booster vaccine. Your health care provider may also recommend other immunizations. Tell your health care provider if you have ever been abused or do not feel safe at home. Summary Menopause is a normal process in which your ability to get pregnant comes to an end. This condition causes hot flashes, night sweats, decreased interest in sex, mood swings, headaches, or lack  of sleep. Treatment for this condition may include hormone replacement therapy. Take actions to keep yourself healthy, including exercising regularly, eating a healthy diet, watching your weight, and checking your blood pressure and blood sugar levels. Get screened for cancer and depression. Make sure that you are up to date with all your vaccines. This information is not intended to replace advice given to you by your health care provider. Make sure you discuss any questions you have with your health care provider. Document Revised: 09/09/2020 Document Reviewed: 09/09/2020 Elsevier Patient Education  2024 [employer].

## 2023-10-19 NOTE — Progress Notes (Signed)
 Subjective:    Patient ID: Brenda Grimes, female    DOB: 03-17-1966, 58 y.o.   MRN: [id number]  HPI  Patient presents to clinic today for her annual exam.  Flu: 04/2023 Tetanus: 09/2021 COVID: Moderna x 2 Prevnar: Never Shingrix: Never Pap smear: 02/2021 Mammogram: 05/2023 Bone density: 05/2023 Colon screening: 04/2021 Vision screening: annually Dentist: biannually  Diet: She does eat meat. She consumes fruits and veggies. She does eat some fried foods. She drinks mostly Sprite. Exercise: None   Review of Systems     Past Medical History:  Diagnosis Date   anal cancer    anal cancer , underwent radiation and chemo   Arthritis    CAD (coronary artery disease)    s/p stenting x 2   Colon polyp    Depression    Hyperlipidemia    MI (myocardial infarction) (HCC)    Thyroid  disease    Vitamin D  deficiency     Current Outpatient Medications  Medication Sig Dispense Refill   acetaminophen (TYLENOL) 500 MG tablet Take 1,000 mg by mouth every 6 (six) hours as needed.     aspirin  EC 81 MG tablet Take 1 tablet (81 mg total) by mouth daily. Swallow whole. 30 tablet 12   Cholecalciferol (VITAMIN D -3 PO) Take by mouth.     Cyanocobalamin  (VITAMIN B-12 PO) Take by mouth.     cyclobenzaprine  (FLEXERIL ) 10 MG tablet Take 1 tablet by oral route at bedtime. (Patient not taking: Reported on 08/12/2023) 30 tablet 0   escitalopram  (LEXAPRO ) 20 MG tablet Take 1 tablet (20 mg total) by mouth daily. 90 tablet 1   hydrochlorothiazide  (MICROZIDE ) 12.5 MG capsule Take 1 capsule (12.5 mg total) by mouth daily as needed. 30 capsule 0   ibuprofen (ADVIL) 200 MG tablet Take 200 mg by mouth every 6 (six) hours as needed.     levothyroxine  (SYNTHROID ) 88 MCG tablet TAKE 1 TABLET BY MOUTH ONCE DAILY BEFORE BREAKFAST 90 tablet 2   loperamide (IMODIUM) 2 MG capsule Take 2 mg by mouth as needed for diarrhea or loose stools.     nitroGLYCERIN  (NITROSTAT ) 0.4 MG SL tablet Place under the tongue.      omeprazole  (PRILOSEC  OTC) 20 MG tablet Take 1 tablet (20 mg total) by mouth daily.     ondansetron  (ZOFRAN ) 4 MG tablet Take 1 tablet (4 mg total) by mouth every 8 (eight) hours as needed for nausea or vomiting. 20 tablet 0   simvastatin  (ZOCOR ) 10 MG tablet Take 1 tablet (10 mg total) by mouth at bedtime. 90 tablet 1   traMADol  (ULTRAM ) 50 MG tablet Take 1 tablet (50 mg total) by mouth daily as needed. 30 tablet 0   Vitamin D , Ergocalciferol , (DRISDOL ) 1.25 MG (50000 UNIT) CAPS capsule Take 1 capsule (50,000 Units total) by mouth once a week. For 12 weeks. Then start OTC Vitamin D3 2,000 unit daily. 12 capsule 0   No current facility-administered medications for this visit.    Allergies  Allergen Reactions   Sulfa Antibiotics Rash   Sulfasalazine Rash   Tape Rash    Family History  Problem Relation Age of Onset   Alzheimer's disease Mother    CAD Father    Rheum arthritis Father    Osteoporosis Father    Thyroid  disease Brother    Hyperlipidemia Brother    Hyperlipidemia Brother    Alcohol abuse Brother    Thyroid  disease Brother    Cirrhosis Brother    Alzheimer's disease Maternal  Grandmother    Heart disease Paternal Grandmother    Heart disease Paternal Grandfather    Healthy Daughter    Healthy Son    Healthy Son    Cancer Paternal Aunt        Possible colon cancer   Heart disease Paternal Uncle    Breast cancer Cousin     Social History   Socioeconomic History   Marital status: Married    Spouse name: Not on file   Number of children: Not on file   Years of education: Not on file   Highest education level: Not on file  Occupational History   Not on file  Tobacco Use   Smoking status: Former    Current packs/day: 0.00    Average packs/day: 0.8 packs/day for 30.0 years (22.5 ttl pk-yrs)    Types: Cigarettes    Start date: 01/20/1977    Quit date: 01/21/2007    Years since quitting: 16.7   Smokeless tobacco: Never  Vaping Use   Vaping status: Never Used   Substance and Sexual Activity   Alcohol use: No   Drug use: No   Sexual activity: Not Currently    Birth control/protection: None  Other Topics Concern   Not on file  Social History Narrative   Not on file   Social Drivers of Health   Financial Resource Strain: Low Risk  (12/11/2022)   Overall Financial Resource Strain (CARDIA)    Difficulty of Paying Living Expenses: Not very hard  Food Insecurity: No Food Insecurity (12/11/2022)   Hunger Vital Sign    Worried About Running Out of Food in the Last Year: Never true    Ran Out of Food in the Last Year: Never true  Transportation Needs: No Transportation Needs (12/11/2022)   PRAPARE - Administrator, Civil Service (Medical): No    Lack of Transportation (Non-Medical): No  Physical Activity: Insufficiently Active (12/11/2022)   Exercise Vital Sign    Days of Exercise per Week: 2 days    Minutes of Exercise per Session: 20 min  Stress: No Stress Concern Present (12/11/2022)   [employer] of Occupational Health - Occupational Stress Questionnaire    Feeling of Stress : Only a little  Social Connections: Moderately Isolated (12/11/2022)   Social Connection and Isolation Panel    Frequency of Communication with Friends and Family: More than three times a week    Frequency of Social Gatherings with Friends and Family: More than three times a week    Attends Religious Services: Never    Database administrator or Organizations: No    Attends Banker Meetings: Never    Marital Status: Married  Catering manager Violence: Not At Risk (12/11/2022)   Humiliation, Afraid, Rape, and Kick questionnaire    Fear of Current or Ex-Partner: No    Emotionally Abused: No    Physically Abused: No    Sexually Abused: No     Constitutional: Denies fever, malaise, fatigue, headache or abrupt weight changes.  HEENT: Denies eye pain, eye redness, ear pain, ringing in the ears, wax buildup, runny nose, nasal congestion, bloody nose,  or sore throat. Respiratory: Denies difficulty breathing, shortness of breath, cough or sputum production.   Cardiovascular: Denies chest pain, chest tightness, palpitations or swelling in the hands or feet.  Gastrointestinal: Patient reports  intermittent diarrhea.  Denies abdominal pain, bloating, constipation,  or blood in the stool.  GU: Denies urgency, frequency, pain with urination, burning sensation,  blood in urine, odor or discharge. Musculoskeletal: Patient reports chronic back pain, joint pain in hands.  Denies decrease in range of motion, difficulty with gait, or joint swelling.  Skin: Denies redness, rashes, lesions or ulcercations.  Neurological:  Denies dizziness, difficulty with memory, difficulty with speech or problems with balance and coordination.  Psych: Patient has a history of depression.  Denies anxiety, SI/HI.  No other specific complaints in a complete review of systems (except as listed in HPI above).  Objective:   Physical Exam   BP 110/60 (BP Location: Left Arm, Patient Position: Sitting, Cuff Size: Normal)   Ht 5' 3 (1.6 m)   Wt 186 lb (84.4 kg)   BMI 32.95 kg/m    Wt Readings from Last 3 Encounters:  08/12/23 182 lb 9.6 oz (82.8 kg)  04/08/23 185 lb 9.6 oz (84.2 kg)  11/11/22 184 lb (83.5 kg)    General: Appears her stated age, obese, in NAD. Skin: Warm, dry and intact.  HEENT: Head: normal shape and size; Eyes: sclera white, no icterus, conjunctiva pink, PERRLA and EOMs intact;  Neck:  Neck supple, trachea midline. No masses, lumps present.  Cardiovascular: Tachycardic with normal rhythm. S1,S2 noted.  No murmur, rubs or gallops noted. No JVD or BLE edema. Varicose veins noted of BLE. No carotid bruits noted. Pulmonary/Chest: Normal effort and positive vesicular breath sounds. No respiratory distress. No wheezes, rales or ronchi noted.  Abdomen: Normal bowel sounds.  Musculoskeletal: Strength 5/5 BUE/BLE. No difficulty with gait.  Neurological:  Alert and oriented. Cranial nerves II-XII grossly intact. Coordination normal.  Psychiatric: Mood and affect normal. Behavior is normal. Judgment and thought content normal.     BMET    Component Value Date/Time   NA 143 04/08/2023 0932   K 4.6 04/08/2023 0932   CL 107 04/08/2023 0932   CO2 30 04/08/2023 0932   GLUCOSE 92 04/08/2023 0932   BUN 16 04/08/2023 0932   CREATININE 0.68 04/08/2023 0932   CALCIUM  8.5 (L) 04/08/2023 0932   GFRNONAA >60 08/08/2021 1509   GFRNONAA 79 04/04/2020 0904   GFRAA 91 04/04/2020 0904    Lipid Panel     Component Value Date/Time   CHOL 201 (H) 04/08/2023 0932   TRIG 165 (H) 04/08/2023 0932   HDL 62 04/08/2023 0932   CHOLHDL 3.2 04/08/2023 0932   LDLCALC 111 (H) 04/08/2023 0932    CBC    Component Value Date/Time   WBC 3.6 (L) 04/08/2023 0932   RBC 3.98 04/08/2023 0932   HGB 13.0 04/08/2023 0932   HCT 39.4 04/08/2023 0932   PLT 205 04/08/2023 0932   MCV 99.0 04/08/2023 0932   MCH 32.7 04/08/2023 0932   MCHC 33.0 04/08/2023 0932   RDW 12.1 04/08/2023 0932   LYMPHSABS 921 07/24/2021 1010   MONOABS 0.3 04/15/2020 1209   EOSABS 60 07/24/2021 1010   BASOSABS 39 07/24/2021 1010    Hgb A1C Lab Results  Component Value Date   HGBA1C 5.4 04/08/2023           Assessment & Plan:   Preventative Health Maintenance:  Encouraged her to get a flu shot in the fall Tetanus UTD Encouraged her to get her COVID booster Discussed Shingrix vaccine, she will check coverage with her insurance company and schedule visit if she would like to have this done Prevnar 20 today Pap smear UTD Mammogram UTD Bone density UTD Colon screening UTD Encouraged her to consume a balanced diet and exercise regimen Advised her to  see an eye doctor and dentist annually We will check CBC, c-Met, TSH, free T4, lipid, A1c today  RTC in 6 months for follow-up of chronic conditions Helayne Lo, NP

## 2023-10-19 NOTE — Assessment & Plan Note (Signed)
 Encouraged diet and exercise for weight loss ?

## 2023-10-20 ENCOUNTER — Ambulatory Visit: Payer: Self-pay | Admitting: Internal Medicine

## 2023-10-20 LAB — COMPREHENSIVE METABOLIC PANEL WITH GFR
AG Ratio: 2.3 (calc) (ref 1.0–2.5)
ALT: 13 U/L (ref 6–29)
AST: 17 U/L (ref 10–35)
Albumin: 4.3 g/dL (ref 3.6–5.1)
Alkaline phosphatase (APISO): 74 U/L (ref 37–153)
BUN: 20 mg/dL (ref 7–25)
CO2: 25 mmol/L (ref 20–32)
Calcium: 9.4 mg/dL (ref 8.6–10.4)
Chloride: 105 mmol/L (ref 98–110)
Creat: 0.78 mg/dL (ref 0.50–1.03)
Globulin: 1.9 g/dL (ref 1.9–3.7)
Glucose, Bld: 125 mg/dL (ref 65–139)
Potassium: 3.8 mmol/L (ref 3.5–5.3)
Sodium: 141 mmol/L (ref 135–146)
Total Bilirubin: 0.4 mg/dL (ref 0.2–1.2)
Total Protein: 6.2 g/dL (ref 6.1–8.1)
eGFR: 87 mL/min/{1.73_m2} (ref >=60)

## 2023-10-20 LAB — LIPID PANEL
Cholesterol: 228 mg/dL — ABNORMAL HIGH (ref <200)
HDL: 56 mg/dL (ref >=50)
Non-HDL Cholesterol (Calc): 172 mg/dL — ABNORMAL HIGH (ref <130)
Total CHOL/HDL Ratio: 4.1 (calc) (ref <5.0)
Triglycerides: 410 mg/dL — ABNORMAL HIGH (ref <150)

## 2023-10-20 LAB — CBC
HCT: 40.2 % (ref 35.0–45.0)
Hemoglobin: 13.2 g/dL (ref 11.7–15.5)
MCH: 33.5 pg — ABNORMAL HIGH (ref 27.0–33.0)
MCHC: 32.8 g/dL (ref 32.0–36.0)
MCV: 102 fL — ABNORMAL HIGH (ref 80.0–100.0)
MPV: 9.5 fL (ref 7.5–12.5)
Platelets: 206 10*3/uL (ref 140–400)
RBC: 3.94 10*6/uL (ref 3.80–5.10)
RDW: 12.9 % (ref 11.0–15.0)
WBC: 4.3 10*3/uL (ref 3.8–10.8)

## 2023-10-20 LAB — HEMOGLOBIN A1C
Hgb A1c MFr Bld: 5.5 % (ref <5.7)
Mean Plasma Glucose: 111 mg/dL
eAG (mmol/L): 6.2 mmol/L

## 2023-10-20 LAB — T4, FREE: Free T4: 1.2 ng/dL (ref 0.8–1.8)

## 2023-10-20 LAB — TSH: TSH: 1.64 m[IU]/L (ref 0.40–4.50)

## 2023-10-30 ENCOUNTER — Other Ambulatory Visit: Payer: Self-pay

## 2023-11-02 NOTE — Telephone Encounter (Signed)
 Requested medication (s) are due for refill today: expired medication date  Requested medication (s) are on the active medication list: yes  Last refill:  04/30/23- 10/19/23 #90 1 refills  Future visit scheduled: yes 04/20/24  Notes to clinic:  expired medication date. Do you want to renew Rx?     Requested Prescriptions  Pending Prescriptions Disp Refills   escitalopram  (LEXAPRO ) 20 MG tablet [Pharmacy Med Name: Escitalopram  Oxalate 20 MG Oral Tablet] 90 tablet 0    Sig: Take 1 tablet by mouth once daily     Psychiatry:  Antidepressants - SSRI Failed - 11/02/2023  8:05 AM      Failed - Valid encounter within last 6 months    Recent Outpatient Visits           2 weeks ago Encounter for general adult medical examination with abnormal findings   [facility] [place], Angeline ORN, NP   2 months ago Rash of face   [facility] [place], [place], NP              Passed - Completed PHQ-2 or PHQ-9 in the last 360 days

## 2023-11-08 ENCOUNTER — Encounter: Payer: Self-pay | Admitting: Internal Medicine

## 2023-11-09 MED ORDER — BUPROPION HCL ER (XL) 150 MG PO TB24: 150.0000 mg | ORAL_TABLET | Freq: Every day | ORAL | 0 refills | Status: DC

## 2023-11-09 NOTE — Addendum Note (Signed)
 Addended by: ANTONETTE ANGELINE ORN on: 11/09/2023 05:03 PM   Modules accepted: Orders

## 2023-12-06 ENCOUNTER — Other Ambulatory Visit: Payer: Self-pay | Admitting: Internal Medicine

## 2023-12-07 IMAGING — CR DG CHEST 2V
1 series · 2 of 2 positions shown · non-contrast
Comparison: Chest x-ray 03/23/2017. CT chest 08/05/2006

CLINICAL DATA: Pt reports she woke up this morning and her hands
and feet were swollen. Pt states she started having centralized CP
and SOB when she got to work.

EXAM:
CHEST - 2 VIEW.  Patient is slightly rotated on frontal view.

[Series 1: dg chest 2 view · 0.14mm/px · 2 of 2 slices shown]
[im 1/2]
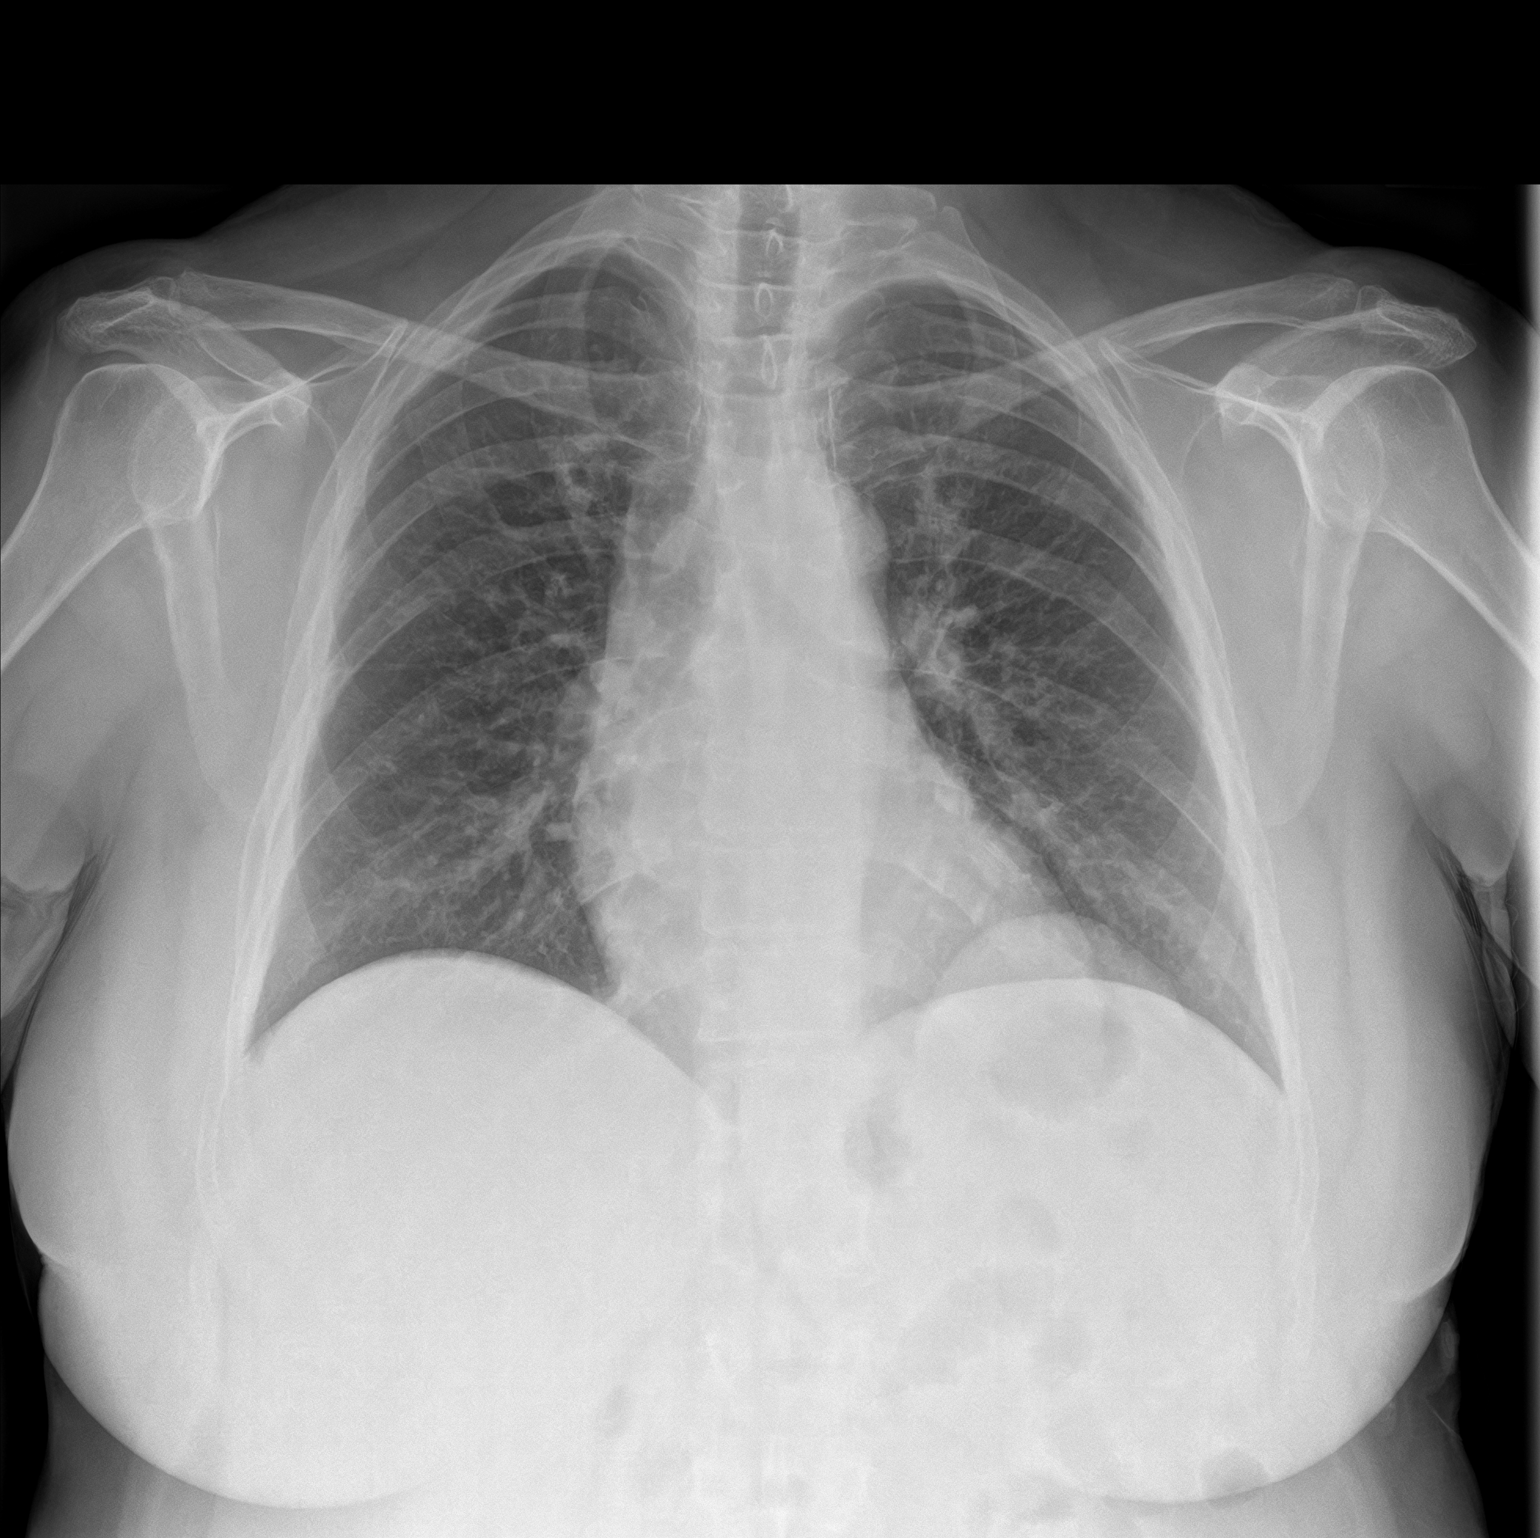
[im 2/2]
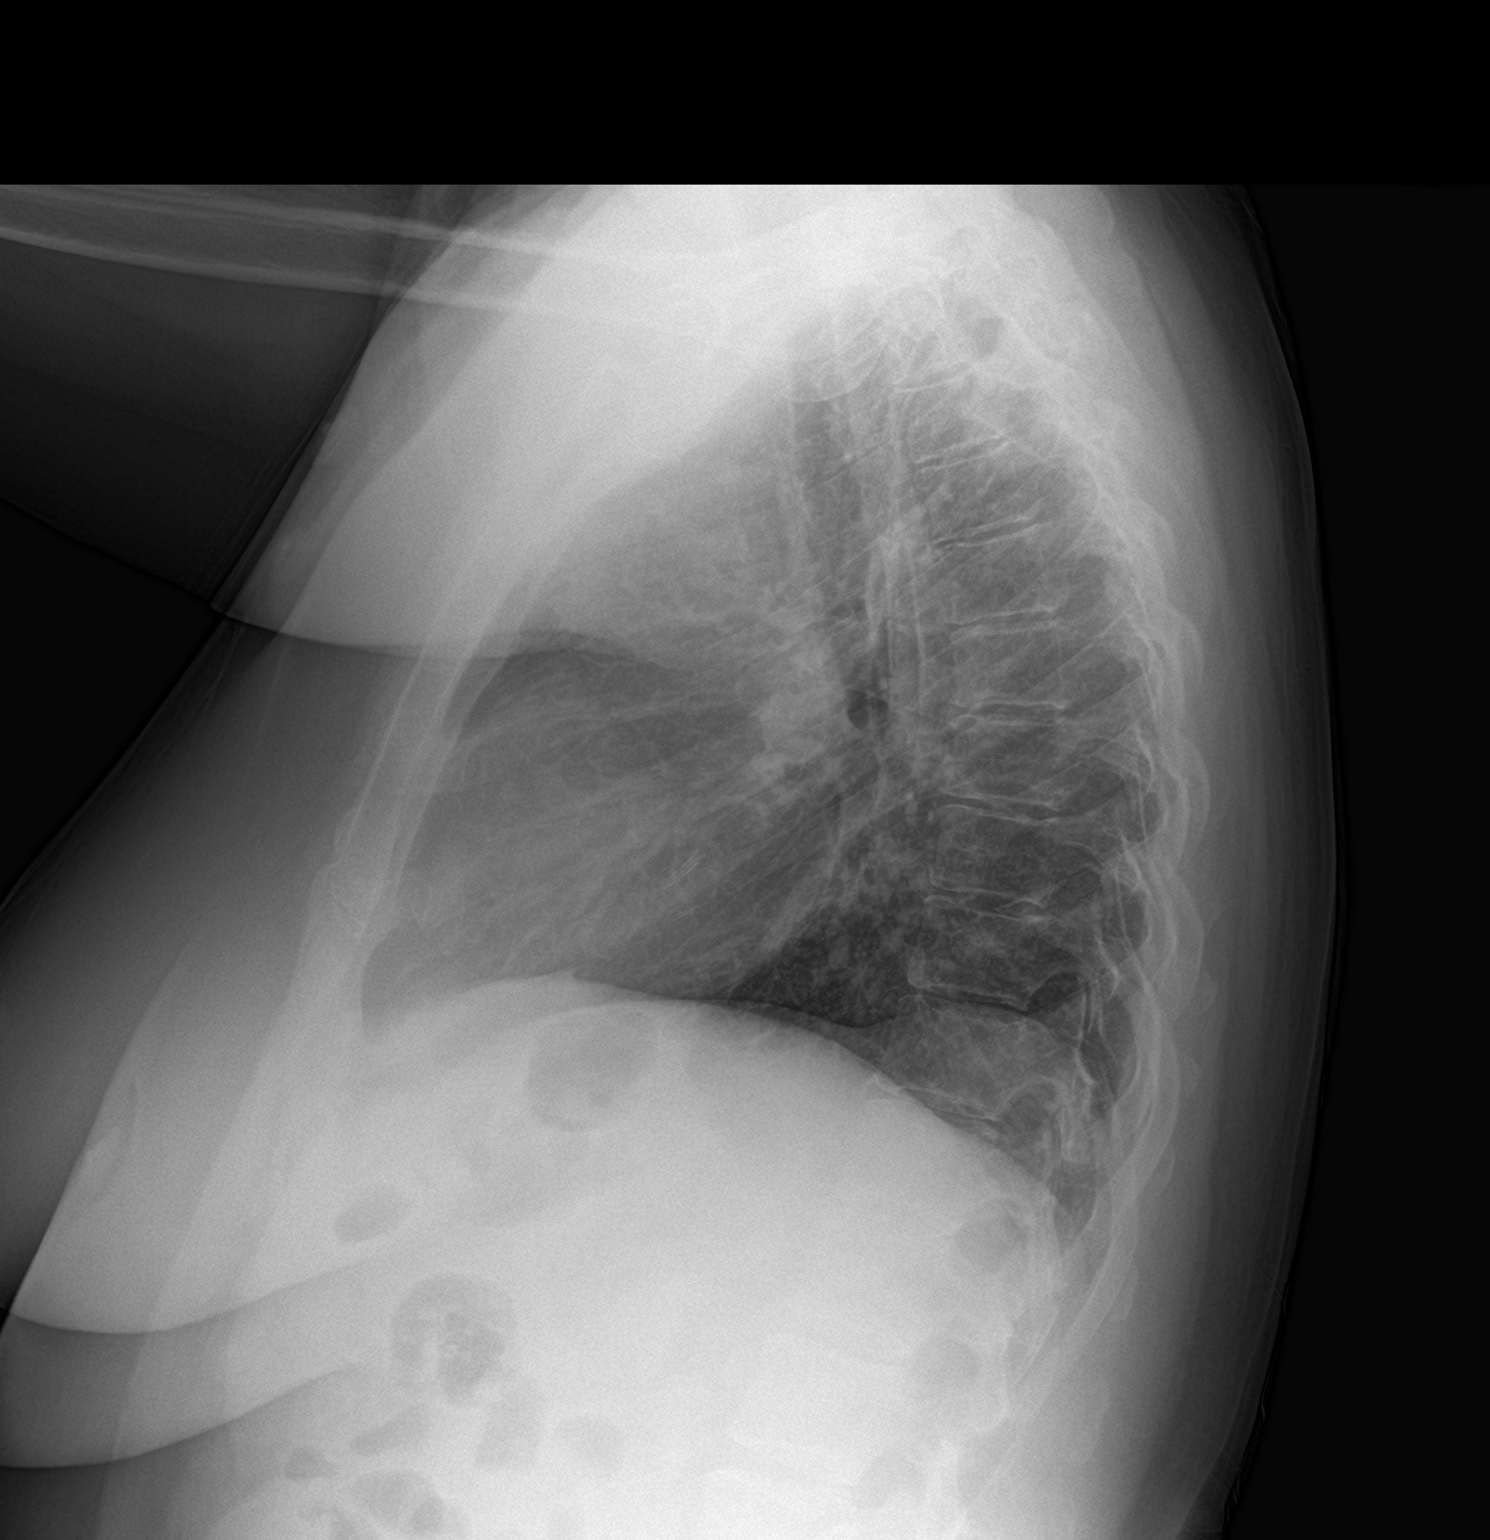

[2 of 2 positions shown; findings below may reference images not displayed]

FINDINGS: The heart and mediastinal contours are within normal limits.

Interval development of a left perihilar airspace opacity. No
pulmonary edema. No pleural effusion. No pneumothorax.

No acute osseous abnormality.
IMPRESSION: Interval development of a left perihilar airspace opacity. Finding
could represent developing focal consolidation. Followup PA and
lateral chest X-ray is recommended in 3-4 weeks following therapy to
ensure resolution and exclude underlying malignancy.

## 2023-12-07 IMAGING — CT CT ANGIO CHEST
2 of 7 series · 18 of 46 positions shown · IV contrast (APPLIED)
Comparison: None.

CLINICAL DATA: Pulmonary embolism (PE) suspected, unknown D-dimer.
Pt reports she woke up this morning and her hands and feet were
swollen. Pt states she started having centralized CP and SOB when
she got to work

EXAM:
CT ANGIOGRAPHY CHEST WITH CONTRAST
TECHNIQUE: Multidetector CT imaging of the chest was performed using the
standard protocol during bolus administration of intravenous
contrast. Multiplanar CT image reconstructions and MIPs were
obtained to evaluate the vascular anatomy.

[Series 6: thins · axial · 0.65mm/px · z∈[-653,-424]mm · 15 of 369 slices shown]
[im 21/369  lung]
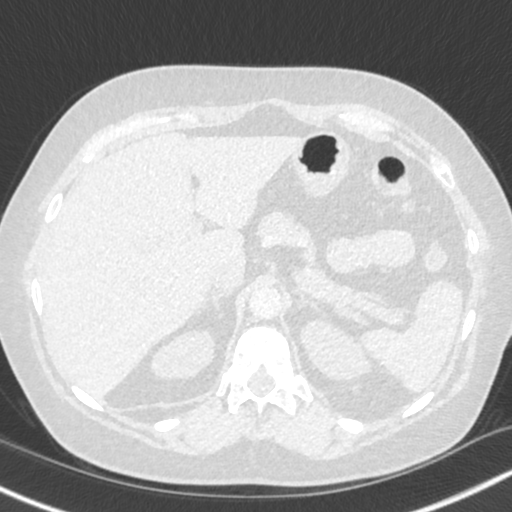
[im 41/369  soft-tissue]
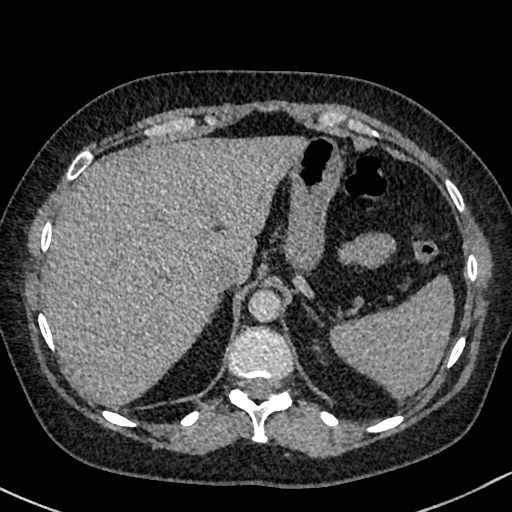
[im 62/369  lung]
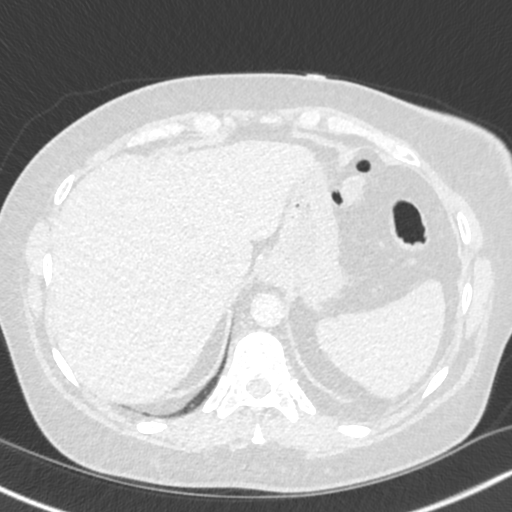
[im 82/369  soft-tissue]
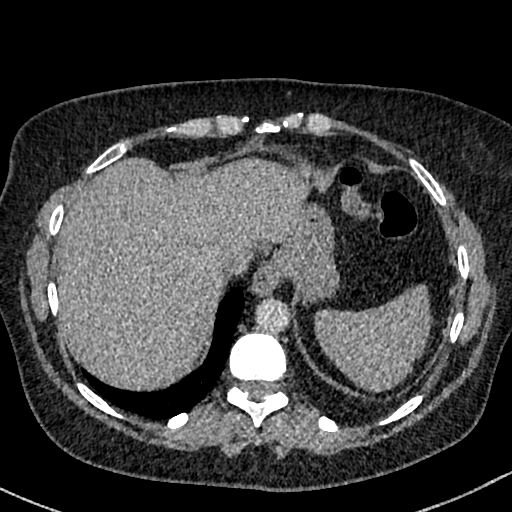
[im 123/369  lung]
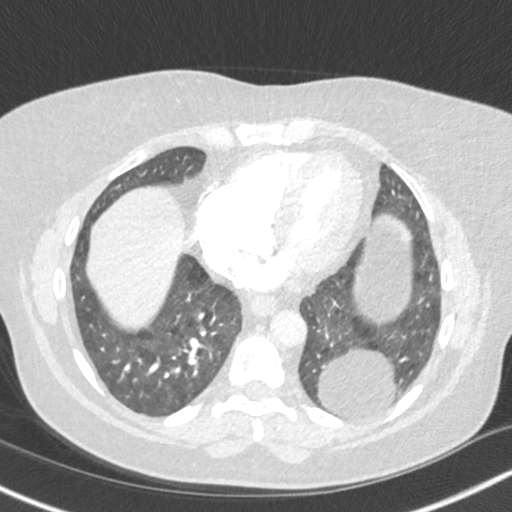
[im 144/369  soft-tissue]
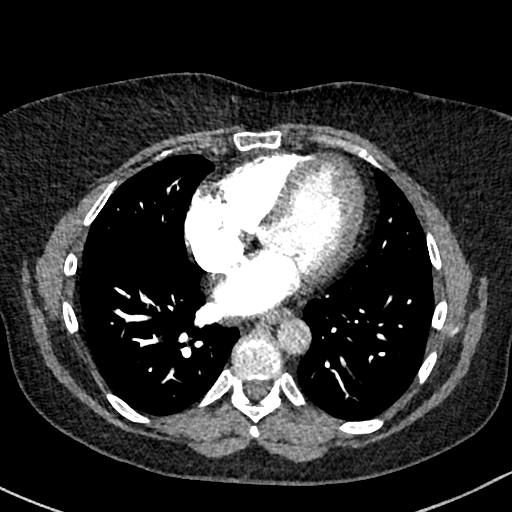
[im 164/369  lung]
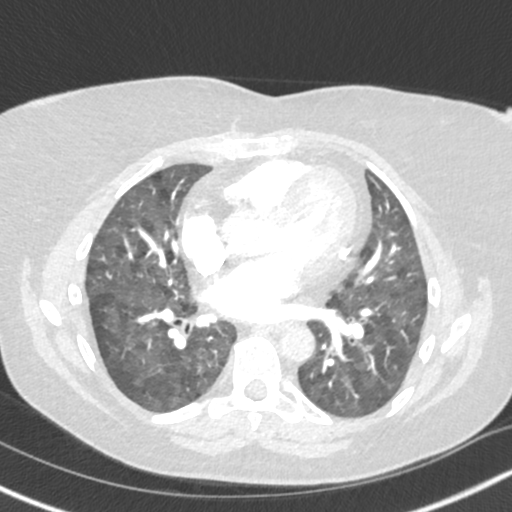
[im 185/369  soft-tissue]
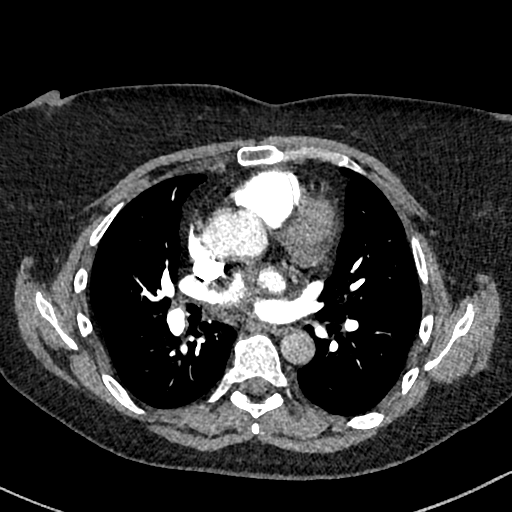
[im 205/369  lung]
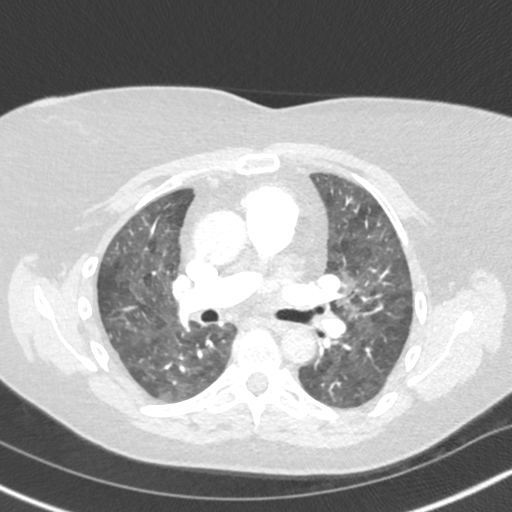
[im 225/369  soft-tissue]
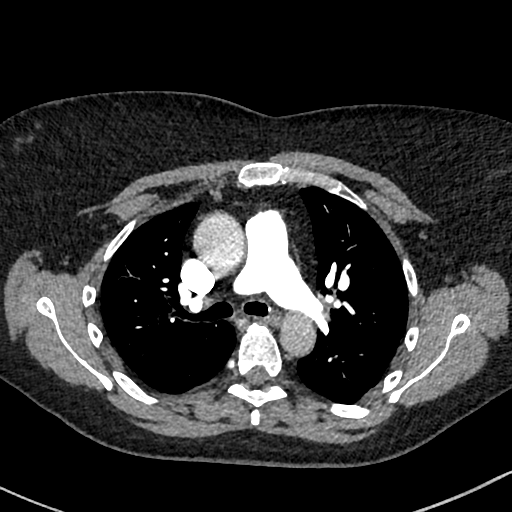
[im 246/369  lung]
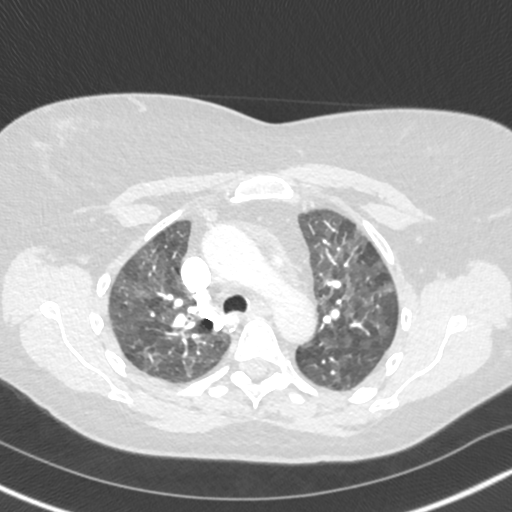
[im 287/369  soft-tissue]
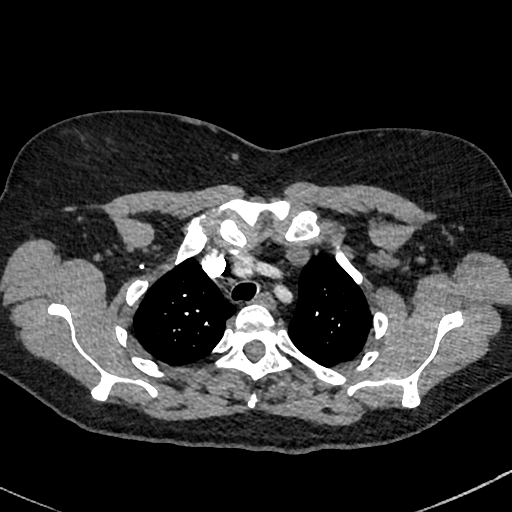
[im 307/369  lung]
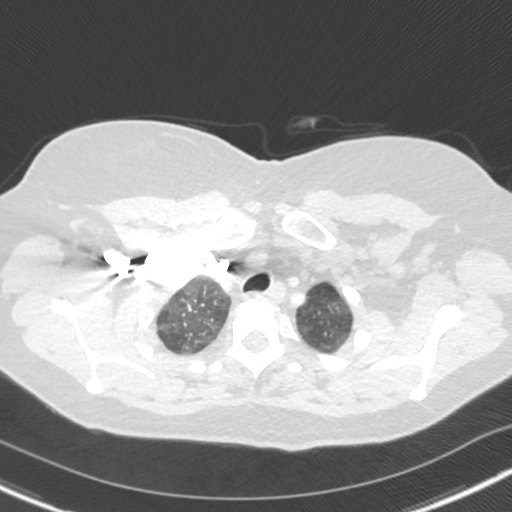
[im 328/369  soft-tissue]
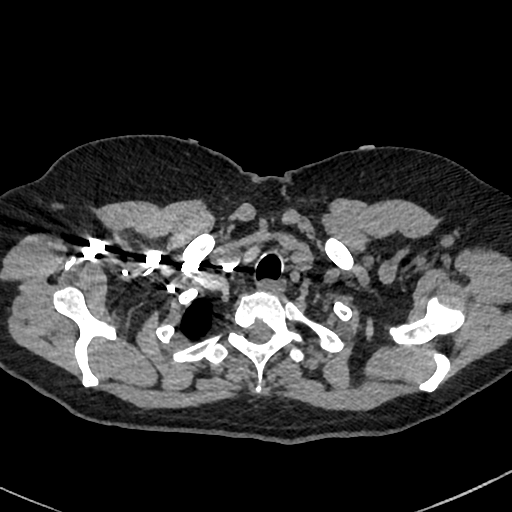
[im 348/369  lung]
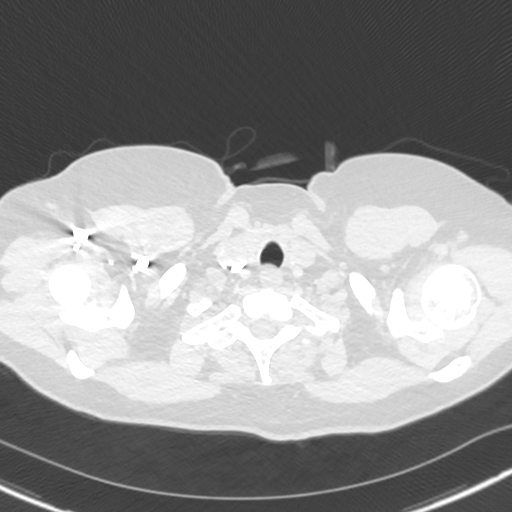

[Series 7: cor · coronal · 0.52mm/px · 3 of 140 slices shown]
[im 35/140  soft-tissue]
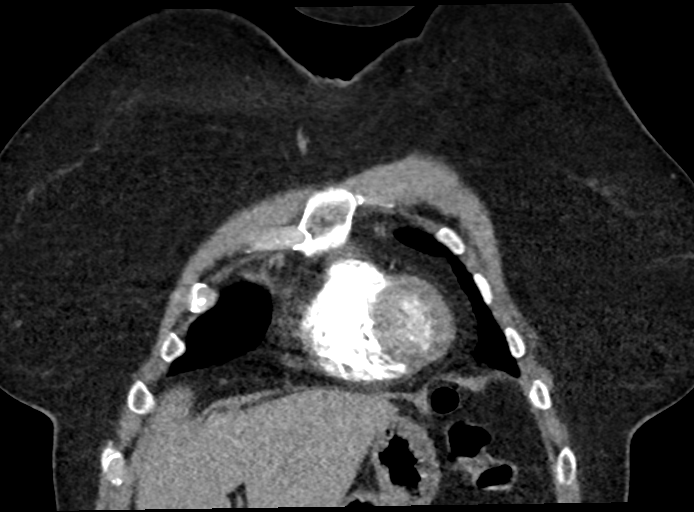
[im 70/140  soft-tissue]
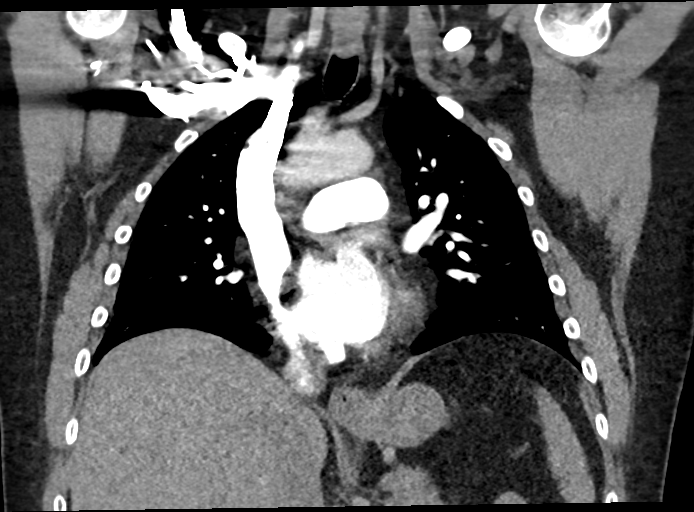
[im 105/140  soft-tissue]
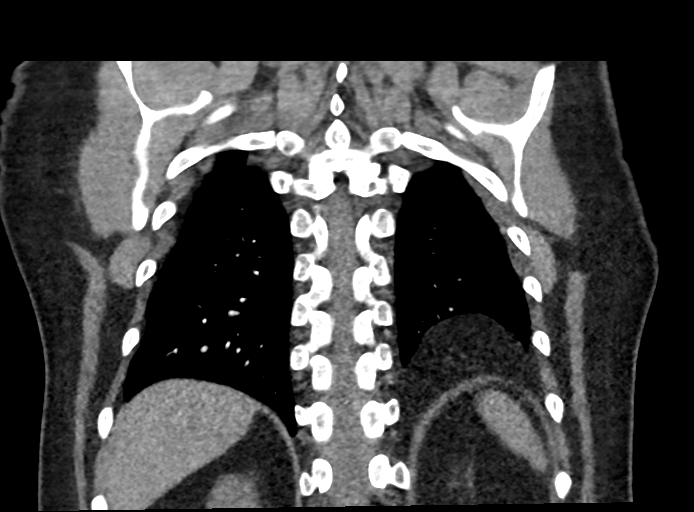

[18 of 46 positions shown; findings below may reference images not displayed]

RADIATION DOSE REDUCTION: This exam was performed according to the
departmental dose-optimization program which includes automated
exposure control, adjustment of the mA and/or kV according to
patient size and/or use of iterative reconstruction technique.

CONTRAST:  75mL OMNIPAQUE IOHEXOL 350 MG/ML SOLN
FINDINGS: Cardiovascular: Satisfactory opacification of the pulmonary arteries
to the segmental level. No evidence of pulmonary embolism. The main
pulmonary artery is normal in caliber. Normal heart size. No
significant pericardial effusion. The thoracic aorta is normal in
caliber. No atherosclerotic plaque of the thoracic aorta. No
coronary artery calcifications.

Mediastinum/Nodes: Left posterior fat containing Bochdalek's hernia.
No enlarged mediastinal, hilar, or axillary lymph nodes. Thyroid
gland, trachea, and esophagus demonstrate no significant findings.

Lungs/Pleura: Expiratory phase of respiration with a mosaic
attenuation of the lungs. No focal consolidation. No pulmonary
nodule. No pulmonary mass. No pleural effusion. No pneumothorax.
Diffuse bronchial wall thickening.

Upper Abdomen: No acute abnormality.

Musculoskeletal:

No chest wall abnormality.

No suspicious lytic or blastic osseous lesions. No acute displaced
fracture.

Review of the MIP images confirms the above findings.
IMPRESSION: 1. No pulmonary embolus.
2. Diffuse bronchial wall thickening with a mosaic attenuation of
the lungs suggestive of small airway disease.

## 2023-12-07 NOTE — Telephone Encounter (Signed)
 Requested Prescriptions  Pending Prescriptions Disp Refills   levothyroxine  (SYNTHROID ) 88 MCG tablet [Pharmacy Med Name: Levothyroxine  Sodium 88 MCG Oral Tablet] 90 tablet 0    Sig: TAKE 1 TABLET BY MOUTH ONCE DAILY BEFORE BREAKFAST     Endocrinology:  Hypothyroid Agents Passed - 12/07/2023 11:17 AM      Passed - TSH in normal range and within 360 days    TSH  Date Value Ref Range Status  10/19/2023 1.64 0.40 - 4.50 mIU/L Final         Passed - Valid encounter within last 12 months    Recent Outpatient Visits           1 month ago Encounter for general adult medical examination with abnormal findings   [facility] [place], Angeline ORN, NP   3 months ago Rash of face   [facility] [place], Angeline ORN, NP

## 2023-12-17 ENCOUNTER — Ambulatory Visit: Payer: BC Managed Care – PPO

## 2023-12-27 ENCOUNTER — Encounter: Payer: Self-pay | Admitting: Internal Medicine

## 2023-12-27 MED ORDER — TRAMADOL HCL 50 MG PO TABS: 50.0000 mg | ORAL_TABLET | Freq: Every day | ORAL | 0 refills | Status: DC | PRN

## 2024-01-27 ENCOUNTER — Ambulatory Visit: Admitting: Internal Medicine

## 2024-01-27 ENCOUNTER — Encounter: Payer: Self-pay | Admitting: Internal Medicine

## 2024-01-27 ENCOUNTER — Ambulatory Visit: Payer: Self-pay | Admitting: Internal Medicine

## 2024-01-27 VITALS — BP 112/68 | Ht 63.0 in | Wt 184.0 lb

## 2024-01-27 DIAGNOSIS — R109 Unspecified abdominal pain: Secondary | ICD-10-CM | POA: Diagnosis not present

## 2024-01-27 DIAGNOSIS — N200 Calculus of kidney: Secondary | ICD-10-CM

## 2024-01-27 DIAGNOSIS — R110 Nausea: Secondary | ICD-10-CM

## 2024-01-27 DIAGNOSIS — R102 Pelvic and perineal pain: Secondary | ICD-10-CM | POA: Diagnosis not present

## 2024-01-27 DIAGNOSIS — K529 Noninfective gastroenteritis and colitis, unspecified: Secondary | ICD-10-CM | POA: Diagnosis not present

## 2024-01-27 DIAGNOSIS — Z85048 Personal history of other malignant neoplasm of rectum, rectosigmoid junction, and anus: Secondary | ICD-10-CM

## 2024-01-27 LAB — POCT URINE DIPSTICK
Bilirubin, UA: NEGATIVE
Glucose, UA: NEGATIVE mg/dL
Ketones, POC UA: NEGATIVE mg/dL
Leukocytes, UA: NEGATIVE
Nitrite, UA: NEGATIVE
POC PROTEIN,UA: NEGATIVE
Spec Grav, UA: 1.015 (ref 1.010–1.025)
Urobilinogen, UA: 0.2 U/dL
pH, UA: 6 (ref 5.0–8.0)

## 2024-01-27 NOTE — Progress Notes (Signed)
 Subjective:    Patient ID: Brenda Grimes, female    DOB: 22-Oct-1965, 58 y.o.   MRN: [id number]  HPI  Discussed the use of AI scribe software for clinical note transcription with the patient, who gave verbal consent to proceed.  Brenda Grimes is a 58 year old female with a history of chronic diarrhea, anal cancer treated with chemotherapy and radiation who presents with nausea, abdominal pain and diarrhea.  She has been experiencing severe abdominal pain primarily in the suprapubic area, with radiation to both sides, particularly the left side. The pain is described as excruciating, especially when attempting to use the bathroom, and has persisted for approximately two weeks.  She also experiences nausea and diarrhea, with bloating and gassiness. There have been no changes in diet or medication in the past two weeks. She has been using Imodium to manage the diarrhea.  No urinary symptoms such as increased frequency, urgency, pain during urination, or blood in her urine, although she has a history of frequent urinary tract infections. She denies vaginal itching, discharge, odor or abnormal vaginal bleeding .  She recalls that during her colonoscopy in 2022, polyps were found and biopsied, but she does not remember the details. She is scheduled for her next colonoscopy in 2027. Her last CT scan in 2021 also did not show diverticulosis. She denies recent change in diet or medications.       Review of Systems     Past Medical History:  Diagnosis Date   anal cancer    anal cancer , underwent radiation and chemo   Arthritis    CAD (coronary artery disease)    s/p stenting x 2   Colon polyp    Depression    Hyperlipidemia    MI (myocardial infarction) (HCC)    Thyroid  disease    Vitamin D  deficiency     Current Outpatient Medications  Medication Sig Dispense Refill   acetaminophen (TYLENOL) 500 MG tablet Take 1,000 mg by mouth every 6 (six) hours as needed.     aspirin  EC 81 MG  tablet Take 1 tablet (81 mg total) by mouth daily. Swallow whole. 30 tablet 12   buPROPion  (WELLBUTRIN  XL) 150 MG 24 hr tablet Take 1 tablet (150 mg total) by mouth daily. 90 tablet 0   Cholecalciferol (VITAMIN D -3 PO) Take by mouth.     Cyanocobalamin  (VITAMIN B-12 PO) Take by mouth.     cyclobenzaprine  (FLEXERIL ) 10 MG tablet Take 1 tablet by oral route at bedtime. (Patient not taking: Reported on 10/19/2023) 30 tablet 0   hydrochlorothiazide  (MICROZIDE ) 12.5 MG capsule Take 1 capsule (12.5 mg total) by mouth daily as needed. 30 capsule 0   ibuprofen (ADVIL) 200 MG tablet Take 200 mg by mouth every 6 (six) hours as needed.     levothyroxine  (SYNTHROID ) 88 MCG tablet TAKE 1 TABLET BY MOUTH ONCE DAILY BEFORE BREAKFAST 90 tablet 0   loperamide (IMODIUM) 2 MG capsule Take 2 mg by mouth as needed for diarrhea or loose stools.     nitroGLYCERIN  (NITROSTAT ) 0.4 MG SL tablet Place under the tongue. (Patient not taking: Reported on 10/19/2023)     omeprazole  (PRILOSEC  OTC) 20 MG tablet Take 1 tablet (20 mg total) by mouth daily.     simvastatin  (ZOCOR ) 10 MG tablet Take 1 tablet (10 mg total) by mouth at bedtime. 90 tablet 1   traMADol  (ULTRAM ) 50 MG tablet Take 1 tablet (50 mg total) by mouth daily as needed. 30 tablet 0  Vitamin D , Ergocalciferol , (DRISDOL ) 1.25 MG (50000 UNIT) CAPS capsule Take 1 capsule (50,000 Units total) by mouth once a week. For 12 weeks. Then start OTC Vitamin D3 2,000 unit daily. 12 capsule 0   No current facility-administered medications for this visit.    Allergies  Allergen Reactions   Sulfa Antibiotics Rash   Sulfasalazine Rash   Tape Rash    Family History  Problem Relation Age of Onset   Alzheimer's disease Mother    CAD Father    Rheum arthritis Father    Osteoporosis Father    Thyroid  disease Brother    Hyperlipidemia Brother    Hyperlipidemia Brother    Alcohol abuse Brother    Thyroid  disease Brother    Cirrhosis Brother    Alzheimer's disease  Maternal Grandmother    Heart disease Paternal Grandmother    Heart disease Paternal Grandfather    Healthy Daughter    Healthy Son    Healthy Son    Cancer Paternal Aunt        Possible colon cancer   Heart disease Paternal Uncle    Breast cancer Cousin     Social History   Socioeconomic History   Marital status: Married    Spouse name: Not on file   Number of children: Not on file   Years of education: Not on file   Highest education level: Not on file  Occupational History   Not on file  Tobacco Use   Smoking status: Former    Current packs/day: 0.00    Average packs/day: 0.8 packs/day for 30.0 years (22.5 ttl pk-yrs)    Types: Cigarettes    Start date: 01/20/1977    Quit date: 01/21/2007    Years since quitting: 17.0   Smokeless tobacco: Never  Vaping Use   Vaping status: Never Used  Substance and Sexual Activity   Alcohol use: No   Drug use: No   Sexual activity: Not Currently    Birth control/protection: None  Other Topics Concern   Not on file  Social History Narrative   Not on file   Social Drivers of Health   Financial Resource Strain: Low Risk  (12/11/2022)   Overall Financial Resource Strain (CARDIA)    Difficulty of Paying Living Expenses: Not very hard  Food Insecurity: No Food Insecurity (12/11/2022)   Hunger Vital Sign    Worried About Running Out of Food in the Last Year: Never true    Ran Out of Food in the Last Year: Never true  Transportation Needs: No Transportation Needs (12/11/2022)   PRAPARE - Administrator, Civil Service (Medical): No    Lack of Transportation (Non-Medical): No  Physical Activity: Insufficiently Active (12/11/2022)   Exercise Vital Sign    Days of Exercise per Week: 2 days    Minutes of Exercise per Session: 20 min  Stress: No Stress Concern Present (12/11/2022)   [employer] of Occupational Health - Occupational Stress Questionnaire    Feeling of Stress : Only a little  Social Connections: Moderately  Isolated (12/11/2022)   Social Connection and Isolation Panel    Frequency of Communication with Friends and Family: More than three times a week    Frequency of Social Gatherings with Friends and Family: More than three times a week    Attends Religious Services: Never    Database administrator or Organizations: No    Attends Banker Meetings: Never    Marital Status: Married  Intimate  Partner Violence: Not At Risk (12/11/2022)   Humiliation, Afraid, Rape, and Kick questionnaire    Fear of Current or Ex-Partner: No    Emotionally Abused: No    Physically Abused: No    Sexually Abused: No     Constitutional: Denies fever, malaise, fatigue, headache or abrupt weight changes.  Respiratory: Denies difficulty breathing, shortness of breath, cough or sputum production.   Cardiovascular: Denies chest pain, chest tightness, palpitations or swelling in the hands or feet.  Gastrointestinal: Patient reports nausea, bloating, abdominal pain, intermittent diarrhea.  Denies constipation, or blood in the stool.  GU: Denies urgency, frequency, pain with urination, burning sensation, blood in urine, odor or discharge. Skin: Denies redness, rashes, lesions or ulcercations.  Neurological:  Denies dizziness, difficulty with memory, difficulty with speech or problems with balance and coordination.   No other specific complaints in a complete review of systems (except as listed in HPI above).  Objective:   Physical Exam  BP 112/68 (BP Location: Right Arm, Patient Position: Sitting, Cuff Size: Normal)   Ht 5' 3 (1.6 m)   Wt 184 lb (83.5 kg)   BMI 32.59 kg/m     Wt Readings from Last 3 Encounters:  10/19/23 186 lb (84.4 kg)  08/12/23 182 lb 9.6 oz (82.8 kg)  04/08/23 185 lb 9.6 oz (84.2 kg)    General: Appears her stated age, obese, in NAD. Cardiovascular: Normal rate and rhythm. Pulmonary/Chest: Normal effort and positive vesicular breath sounds. No respiratory distress. Abdomen:  Soft, tender in the epigastric region. No CVA tenderness noted. Musculoskeletal:  No difficulty with gait.  Neurological: Alert and oriented.    BMET    Component Value Date/Time   NA 141 10/19/2023 1348   K 3.8 10/19/2023 1348   CL 105 10/19/2023 1348   CO2 25 10/19/2023 1348   GLUCOSE 125 10/19/2023 1348   BUN 20 10/19/2023 1348   CREATININE 0.78 10/19/2023 1348   CALCIUM  9.4 10/19/2023 1348   GFRNONAA >60 08/08/2021 1509   GFRNONAA 79 04/04/2020 0904   GFRAA 91 04/04/2020 0904    Lipid Panel     Component Value Date/Time   CHOL 228 (H) 10/19/2023 1348   TRIG 410 (H) 10/19/2023 1348   HDL 56 10/19/2023 1348   CHOLHDL 4.1 10/19/2023 1348   LDLCALC  10/19/2023 1348     Comment:     . LDL cholesterol not calculated. Triglyceride levels greater than 400 mg/dL invalidate calculated LDL results. . Reference range: <100 . Desirable range <100 mg/dL for primary prevention;   <70 mg/dL for patients with CHD or diabetic patients  with > or = 2 CHD risk factors. SABRA LDL-C is now calculated using the Martin-Hopkins  calculation, which is a validated novel method providing  better accuracy than the Friedewald equation in the  estimation of LDL-C.  Gladis APPLETHWAITE et al. SANDREA. 7986;689(80): 2061-2068  (http://education.[facility].com/faq/FAQ164)     CBC    Component Value Date/Time   WBC 4.3 10/19/2023 1348   RBC 3.94 10/19/2023 1348   HGB 13.2 10/19/2023 1348   HCT 40.2 10/19/2023 1348   PLT 206 10/19/2023 1348   MCV 102.0 (H) 10/19/2023 1348   MCH 33.5 (H) 10/19/2023 1348   MCHC 32.8 10/19/2023 1348   RDW 12.9 10/19/2023 1348   LYMPHSABS 921 07/24/2021 1010   MONOABS 0.3 04/15/2020 1209   EOSABS 60 07/24/2021 1010   BASOSABS 39 07/24/2021 1010    Hgb A1C Lab Results  Component Value Date   HGBA1C  5.5 10/19/2023           Assessment & Plan:   Assessment and Plan    Abdominal pain with diarrhea and nausea Excruciating suprapubic and left-sided pain  with diarrhea and nausea for two weeks. Differential includes diverticulitis and UTI. Previous colonoscopy negative for diverticulosis. History of recurrent UTIs may contribute. - Urinalysis shows trace blood, will send urin culture - Consider CT scan of abdomen and pelvis to r/o diverticulitis if urinalysis normal. - Consider referral to GI for repeat colonoscopy - Discuss results via MyChart.  Recurrent urinary tract infections Recurrent UTIs may contribute to abdominal pain. - urinalysis shows trace blood, will send urine culture  Chronic diarrhea Current diarrhea may be chronic or related to abdominal pain. -Consider CT scan of abdomen and pelvis to r/o diverticulitis if urinalysis normal. - Consider referral to GI for repeat colonoscopy  History of anal cancer, status post chemoradiation Treated with chemoradiation 13 years ago. Last colonoscopy in 2022 showed precancerous polyps. Next colonoscopy in 2027. - Consider GI referral for further evaluation if needed.        RTC in 3 months for follow-up of chronic conditions Angeline Laura, NP

## 2024-01-28 ENCOUNTER — Encounter: Payer: Self-pay | Admitting: Internal Medicine

## 2024-01-28 DIAGNOSIS — R1032 Left lower quadrant pain: Secondary | ICD-10-CM

## 2024-01-28 LAB — URINE CULTURE
MICRO NUMBER:: 17018170
SPECIMEN QUALITY:: ADEQUATE

## 2024-02-01 ENCOUNTER — Ambulatory Visit: Payer: Self-pay | Admitting: Internal Medicine

## 2024-02-01 ENCOUNTER — Ambulatory Visit
Admission: RE | Admit: 2024-02-01 | Discharge: 2024-02-01 | Disposition: A | Discharge status: Home / Self Care | Source: Ambulatory Visit | Attending: Internal Medicine | Admitting: Internal Medicine

## 2024-02-01 ENCOUNTER — Telehealth: Payer: Self-pay

## 2024-02-01 DIAGNOSIS — N200 Calculus of kidney: Secondary | ICD-10-CM | POA: Diagnosis not present

## 2024-02-01 DIAGNOSIS — R1032 Left lower quadrant pain: Secondary | ICD-10-CM | POA: Insufficient documentation

## 2024-02-01 MED ORDER — IOHEXOL 300 MG/ML  SOLN
100.0000 mL | Freq: Once | INTRAMUSCULAR | Status: AC | PRN
  Administered 2024-02-01: 100 mL via INTRAVENOUS

## 2024-02-01 MED ORDER — IOHEXOL 9 MG/ML PO SOLN
500.0000 mL | ORAL | Status: AC
  Administered 2024-02-01: 500 mL via ORAL

## 2024-02-01 NOTE — Telephone Encounter (Signed)
 Copied from CRM #[id number]. Topic: Clinical - Medical Advice >> Jan 31, 2024  4:55 PM Everette C wrote: Reason for CRM: The patient has returned a call from practice staff regarding imaging scheduling for their CT scan. Please contact the patient when possible

## 2024-02-01 NOTE — Telephone Encounter (Signed)
 Returned patients call to let her know we reached out about her CT scheduled for today. Patient states she is aware. Confirmed time.

## 2024-02-01 NOTE — Addendum Note (Signed)
 Addended by: ANTONETTE ANGELINE ORN on: 02/01/2024 02:58 PM   Modules accepted: Orders

## 2024-02-02 ENCOUNTER — Other Ambulatory Visit: Payer: Self-pay | Admitting: Internal Medicine

## 2024-02-03 ENCOUNTER — Other Ambulatory Visit: Payer: Self-pay | Admitting: Internal Medicine

## 2024-02-04 NOTE — Telephone Encounter (Signed)
 Requested Prescriptions  Pending Prescriptions Disp Refills   buPROPion  (WELLBUTRIN  XL) 150 MG 24 hr tablet [Pharmacy Med Name: buPROPion  HCl ER (XL) 150 MG Oral Tablet Extended Release 24 Hour] 90 tablet 0    Sig: Take 1 tablet by mouth once daily     Psychiatry: Antidepressants - bupropion  Passed - 02/04/2024 12:11 PM      Passed - Cr in normal range and within 360 days    Creat  Date Value Ref Range Status  10/19/2023 0.78 0.50 - 1.03 mg/dL Final         Passed - AST in normal range and within 360 days    AST  Date Value Ref Range Status  10/19/2023 17 10 - 35 U/L Final         Passed - ALT in normal range and within 360 days    ALT  Date Value Ref Range Status  10/19/2023 13 6 - 29 U/L Final         Passed - Completed PHQ-2 or PHQ-9 in the last 360 days      Passed - Last BP in normal range    BP Readings from Last 1 Encounters:  01/27/24 112/68         Passed - Valid encounter within last 6 months    Recent Outpatient Visits           1 week ago History of anal cancer   [place] [facility] [place], Angeline ORN, NP   3 months ago Encounter for general adult medical examination with abnormal findings   [place] [facility] [place], Angeline ORN, NP   5 months ago Rash of face    [place] [facility] [place], Angeline ORN, [place]

## 2024-02-04 NOTE — Telephone Encounter (Signed)
 Discontinued 11/09/23.  Requested Prescriptions  Pending Prescriptions Disp Refills   escitalopram  (LEXAPRO ) 20 MG tablet [Pharmacy Med Name: Escitalopram  Oxalate 20 MG Oral Tablet] 90 tablet 0    Sig: Take 1 tablet by mouth once daily     Psychiatry:  Antidepressants - SSRI Passed - 02/04/2024  9:42 AM      Passed - Completed PHQ-2 or PHQ-9 in the last 360 days      Passed - Valid encounter within last 6 months    Recent Outpatient Visits           1 week ago History of anal cancer   [place] [facility] [place], Angeline ORN, NP   3 months ago Encounter for general adult medical examination with abnormal findings   [place] [facility] [place], Angeline ORN, NP   5 months ago Rash of face   [place] [facility] [place], Angeline ORN, NP

## 2024-02-05 ENCOUNTER — Encounter: Payer: Self-pay | Admitting: Internal Medicine

## 2024-02-07 DIAGNOSIS — N200 Calculus of kidney: Secondary | ICD-10-CM | POA: Insufficient documentation

## 2024-02-07 DIAGNOSIS — R10A1 Flank pain, right side: Secondary | ICD-10-CM | POA: Insufficient documentation

## 2024-02-07 MED ORDER — ESCITALOPRAM OXALATE 20 MG PO TABS: 20.0000 mg | ORAL_TABLET | Freq: Every day | ORAL | 0 refills | Status: DC

## 2024-02-07 MED ORDER — TRAMADOL HCL 50 MG PO TABS: 50.0000 mg | ORAL_TABLET | Freq: Every day | ORAL | 0 refills | Status: DC | PRN

## 2024-02-07 NOTE — Assessment & Plan Note (Addendum)
 Possible Right proximal ureteral kinking w/ mild pelviectasis (via recent CT)  Lower pole crossing vessel  33-month history of moderate to severe right flank pain No explicit Dietl's crises  - Lasix renogram to evaluate right renal drainage, exclude GU etiology of pain syndrome -Follow-up in clinic following to review results

## 2024-02-07 NOTE — Progress Notes (Signed)
 02/16/24 9:34 AM   Brenda Grimes 26-Dec-1965 [id number]   HPI: 58 y.o. female here for initial evaluation of nephrolithiasis UA/Ucx (01/27/24) - negative  Very pleasant patient 54-month history of right flank pain, colicky, moderate to severe No prior GU issues, no prior GU surgeries Very brief LLQ discomfort a couple months ago, now resolved, asymptomatic on the left side Sporadic history of UTIs (< 2 per year), no pyelonephritis  CT A/P w/ con (02/01/24) - 9mm nonobstructive left renal stone  History of rectal cancer 10 years ago s/p chemoradiation Follow-ups reportedly NED, due for colonoscopy next year Denies abdominal bowel surgery History of CAD s/p 2 cardiac stents   PMH: Past Medical History:  Diagnosis Date   anal cancer    anal cancer , underwent radiation and chemo   Arthritis    CAD (coronary artery disease)    s/p stenting x 2   Colon polyp    Depression    Hyperlipidemia    MI (myocardial infarction) (HCC)    Thyroid  disease    Vitamin D  deficiency     Surgical History: Past Surgical History:  Procedure Laterality Date   CARDIAC SURGERY  2010   2 stents   COLONOSCOPY WITH PROPOFOL  N/A 04/17/2021   Procedure: COLONOSCOPY WITH BIOPSY;  Surgeon: Unk Corinn Skiff, MD;  Location: [facility];  Service: Endoscopy;  Laterality: N/A;   CORONARY ANGIOPLASTY WITH STENT PLACEMENT     POLYPECTOMY N/A 04/17/2021   Procedure: POLYPECTOMY;  Surgeon: Unk Corinn Skiff, MD;  Location: [facility];  Service: Endoscopy;  Laterality: N/A;   TUBAL LIGATION      Family History: Family History  Problem Relation Age of Onset   Alzheimer's disease Mother    CAD Father    Rheum arthritis Father    Osteoporosis Father    Thyroid  disease Brother    Hyperlipidemia Brother    Hyperlipidemia Brother    Alcohol abuse Brother    Thyroid  disease Brother    Cirrhosis Brother    Alzheimer's disease Maternal Grandmother    Heart disease Paternal  Grandmother    Heart disease Paternal Grandfather    Healthy Daughter    Healthy Son    Healthy Son    Cancer Paternal Aunt        Possible colon cancer   Heart disease Paternal Uncle    Breast cancer Cousin     Social History:  reports that she quit smoking about 17 years ago. Her smoking use included cigarettes. She started smoking about 47 years ago. She has a 22.5 pack-year smoking history. She has never used smokeless tobacco. She reports that she does not drink alcohol and does not use drugs.      Physical Exam: BP 108/73   Pulse 71   Ht 5' 3 (1.6 m)   Wt 184 lb (83.5 kg)   BMI 32.59 kg/m    Constitutional:  Alert and oriented, No acute distress. Cardiovascular: No clubbing, cyanosis, or edema. Respiratory: Normal respiratory effort, no increased work of breathing. GI: Nondistended Skin: No rashes, bruises or suspicious lesions. Neurologic: Grossly intact, no focal deficits, moving all 4 extremities. Psychiatric: Normal mood and affect.  Laboratory Data: Urine culture (01/27/24) - MUF   Pertinent Imaging: I have personally viewed and interpreted the CT A/P w/ con (02/01/24) -9 mm left lower pole renal stone, nonobstructive.  No nephrolithiasis in the right kidney.  Bilateral kidneys are otherwise morphologically normal.  Possible slight right pelviectasis, compared to the  left.  There is a kinking of the right proximal ureter secondary to a lower pole crossing artery.    Assessment & Plan:    Nephrolithiasis Assessment & Plan: 9mm left lower pole nonobstructive stone Unlikely the source of abdominal pain  Today we reviewed the basic tenets of kidney stone management and prevention. We reviewed five broad recommendations:   - Adequate fluid intake >=2.5 L/day (goal urine output >=2 L/day). - Maintenance of normal dietary calcium  (1,000-1,200 mg/day), avoid excess calcium  supplements - Reduction of sodium intake (<2 g/day) to lower urinary calcium  excretion. -  Moderation of oxalate-rich foods (nuts, spinach, beets, chocolate, tea)  - Limit animal protein (meat, fish, poultry); promote balanced diet with fruits/vegetables.   - Expectant management only at this time   Right flank pain Assessment & Plan: Possible Right proximal ureteral kinking w/ mild pelviectasis (via recent CT)  Lower pole crossing vessel  75-month history of moderate to severe right flank pain No explicit Dietl's crises  - Lasix renogram to evaluate right renal drainage, exclude GU etiology of pain syndrome -Follow-up in clinic following to review results  Orders: -     NM Renal Imaging Flow W/Pharm; Future  Hydronephrosis with ureteropelvic junction (UPJ) obstruction -     NM Renal Imaging Flow W/Pharm; Future      Penne Skye, MD 02/16/2024  [facility] [address] [place], [place] [address] [phone]

## 2024-02-07 NOTE — Assessment & Plan Note (Addendum)
 9mm left lower pole nonobstructive stone Unlikely the source of abdominal pain  Today we reviewed the basic tenets of kidney stone management and prevention. We reviewed five broad recommendations:   - Adequate fluid intake >=2.5 L/day (goal urine output >=2 L/day). - Maintenance of normal dietary calcium  (1,000-1,200 mg/day), avoid excess calcium  supplements - Reduction of sodium intake (<2 g/day) to lower urinary calcium  excretion. - Moderation of oxalate-rich foods (nuts, spinach, beets, chocolate, tea)  - Limit animal protein (meat, fish, poultry); promote balanced diet with fruits/vegetables.   - Expectant management only at this time

## 2024-02-13 ENCOUNTER — Encounter: Payer: Self-pay | Admitting: Internal Medicine

## 2024-02-13 DIAGNOSIS — R1032 Left lower quadrant pain: Secondary | ICD-10-CM

## 2024-02-16 ENCOUNTER — Ambulatory Visit: Admitting: Urology

## 2024-02-16 VITALS — BP 108/73 | HR 71 | Ht 63.0 in | Wt 184.0 lb

## 2024-02-16 DIAGNOSIS — Q6211 Congenital occlusion of ureteropelvic junction: Secondary | ICD-10-CM | POA: Diagnosis not present

## 2024-02-16 DIAGNOSIS — N200 Calculus of kidney: Secondary | ICD-10-CM

## 2024-02-16 DIAGNOSIS — R10A1 Flank pain, right side: Secondary | ICD-10-CM | POA: Diagnosis not present

## 2024-02-16 NOTE — Patient Instructions (Signed)
 Please contact Central Scheduling to set up your renal Scan at [phone].

## 2024-02-21 ENCOUNTER — Encounter: Payer: Self-pay | Admitting: Internal Medicine

## 2024-02-24 ENCOUNTER — Ambulatory Visit

## 2024-02-25 DIAGNOSIS — Z79899 Other long term (current) drug therapy: Secondary | ICD-10-CM | POA: Diagnosis not present

## 2024-02-25 DIAGNOSIS — E039 Hypothyroidism, unspecified: Secondary | ICD-10-CM | POA: Diagnosis not present

## 2024-02-25 DIAGNOSIS — Z882 Allergy status to sulfonamides status: Secondary | ICD-10-CM | POA: Diagnosis not present

## 2024-02-25 DIAGNOSIS — Z91048 Other nonmedicinal substance allergy status: Secondary | ICD-10-CM | POA: Diagnosis not present

## 2024-02-25 DIAGNOSIS — Z87891 Personal history of nicotine dependence: Secondary | ICD-10-CM | POA: Diagnosis not present

## 2024-02-25 DIAGNOSIS — E785 Hyperlipidemia, unspecified: Secondary | ICD-10-CM | POA: Diagnosis not present

## 2024-02-25 DIAGNOSIS — I2510 Atherosclerotic heart disease of native coronary artery without angina pectoris: Secondary | ICD-10-CM | POA: Diagnosis not present

## 2024-02-25 DIAGNOSIS — R3129 Other microscopic hematuria: Secondary | ICD-10-CM | POA: Diagnosis not present

## 2024-02-25 DIAGNOSIS — R10A1 Flank pain, right side: Secondary | ICD-10-CM | POA: Diagnosis not present

## 2024-02-25 DIAGNOSIS — Z7982 Long term (current) use of aspirin: Secondary | ICD-10-CM | POA: Diagnosis not present

## 2024-02-28 ENCOUNTER — Ambulatory Visit
Admission: RE | Admit: 2024-02-28 | Discharge: 2024-02-28 | Disposition: A | Discharge status: Home / Self Care | Source: Ambulatory Visit | Attending: Urology | Admitting: Urology

## 2024-02-28 DIAGNOSIS — Q6211 Congenital occlusion of ureteropelvic junction: Secondary | ICD-10-CM | POA: Diagnosis not present

## 2024-02-28 DIAGNOSIS — N1330 Unspecified hydronephrosis: Secondary | ICD-10-CM | POA: Diagnosis not present

## 2024-02-28 DIAGNOSIS — R10A1 Flank pain, right side: Secondary | ICD-10-CM | POA: Diagnosis not present

## 2024-02-28 MED ORDER — FUROSEMIDE 10 MG/ML IJ SOLN
41.0000 mg | INTRAMUSCULAR | Status: AC
  Administered 2024-02-28: 41 mg via INTRAVENOUS
  Filled 2024-02-28: qty 4.1

## 2024-02-28 MED ORDER — TECHNETIUM TC 99M MERTIATIDE
5.0000 | Freq: Once | INTRAVENOUS | Status: AC | PRN
  Administered 2024-02-28: 5.25 via INTRAVENOUS

## 2024-03-02 ENCOUNTER — Other Ambulatory Visit: Payer: Self-pay | Admitting: Medical Genetics

## 2024-03-03 ENCOUNTER — Other Ambulatory Visit: Payer: Self-pay | Admitting: Internal Medicine

## 2024-03-04 NOTE — Telephone Encounter (Signed)
 Requested Prescriptions  Pending Prescriptions Disp Refills   levothyroxine  (SYNTHROID ) 88 MCG tablet [Pharmacy Med Name: Levothyroxine  Sodium 88 MCG Oral Tablet] 90 tablet 0    Sig: TAKE 1 TABLET BY MOUTH ONCE DAILY BEFORE BREAKFAST     Endocrinology:  Hypothyroid Agents Passed - 03/04/2024  9:30 AM      Passed - TSH in normal range and within 360 days    TSH  Date Value Ref Range Status  10/19/2023 1.64 0.40 - 4.50 mIU/L Final         Passed - Valid encounter within last 12 months    Recent Outpatient Visits           1 month ago History of anal cancer   [place] [facility] [place], Angeline ORN, NP   4 months ago Encounter for general adult medical examination with abnormal findings   [place] [facility] [place], Angeline ORN, NP   6 months ago Rash of face   [place] [facility] [place], Angeline ORN, NP       Future Appointments             In 1 week [place], Penne SAUNDERS, MD [facility] [place]

## 2024-03-08 NOTE — Progress Notes (Deleted)
   03/15/2024 9:07 AM   [place] Kyle October 04, 1965 [id number]  Reason for visit: Follow up chronic R flank pain   HPI: 58 y.o. female, follow up with me today Lasix renogram (03/02/24) - normal prompt excretion, 50/50 split function   Prior HPI: Very pleasant patient 65-month history of right flank pain, colicky, moderate to severe No prior GU issues, no prior GU surgeries Very brief LLQ discomfort a couple months ago, now resolved, asymptomatic on the left side Sporadic history of UTIs (< 2 per year), no pyelonephritis   CT A/P w/ con (02/01/24) - 9mm nonobstructive left renal stone   History of rectal cancer 10 years ago s/p chemoradiation Follow-ups reportedly NED, due for colonoscopy next year Denies abdominal bowel surgery History of CAD s/p 2 cardiac stents    Physical Exam: There were no vitals taken for this visit.   Constitutional:  Alert and oriented, No acute distress.  Laboratory Data: N/A  Pertinent Imaging: I have personally viewed and interpreted the Lasix renogram (03/02/24) - normal prompt excretion bilaterally , 50/50 split function     Assessment & Plan:    Nephrolithiasis Assessment & Plan: 9mm left lower pole nonobstructive stone Unlikely the source of abdominal pain  - Expectant management only at this time - RTC in 1 year with KUB - may consider pre-emptive ESWL in the future   Right flank pain Assessment & Plan: 84-month history of moderate to severe right flank pain  Lasix renogram (Oct 2025) - normal, unobstructed, 50/50 split   Reviewed her recent renogram results which unequivocally rule out a renal drainage issue. I believe her imaging findings to be incidental and her Right sided pain syndrome to be unrelated. Recommend follow up with her PCP for continued workup and managemnt.         Penne JONELLE Skye, MD  [facility] [address] [place], [place] [address] [phone]

## 2024-03-08 NOTE — Assessment & Plan Note (Deleted)
 26-month history of moderate to severe right flank pain  Lasix renogram (Oct 2025) - normal, unobstructed, 50/50 split   Reviewed her recent renogram results which unequivocally rule out a renal drainage issue. I believe her imaging findings to be incidental and her Right sided pain syndrome to be unrelated. Recommend follow up with her PCP for continued workup and managemnt.

## 2024-03-08 NOTE — Assessment & Plan Note (Deleted)
 9mm left lower pole nonobstructive stone Unlikely the source of abdominal pain  - Expectant management only at this time - RTC in 1 year with KUB - may consider pre-emptive ESWL in the future

## 2024-03-14 ENCOUNTER — Encounter: Payer: Self-pay | Admitting: Internal Medicine

## 2024-03-14 MED ORDER — TRAMADOL HCL 50 MG PO TABS: 50.0000 mg | ORAL_TABLET | Freq: Every day | ORAL | 0 refills | Status: DC | PRN

## 2024-03-15 ENCOUNTER — Encounter: Payer: Self-pay | Admitting: Urology

## 2024-03-15 ENCOUNTER — Ambulatory Visit: Admitting: Urology

## 2024-03-15 DIAGNOSIS — N200 Calculus of kidney: Secondary | ICD-10-CM

## 2024-03-15 DIAGNOSIS — R10A1 Flank pain, right side: Secondary | ICD-10-CM

## 2024-04-10 ENCOUNTER — Encounter: Payer: Self-pay | Admitting: Internal Medicine

## 2024-04-17 ENCOUNTER — Encounter: Payer: Self-pay | Admitting: Internal Medicine

## 2024-04-18 MED ORDER — TRAMADOL HCL 50 MG PO TABS: 50.0000 mg | ORAL_TABLET | Freq: Every day | ORAL | 0 refills | Status: DC | PRN

## 2024-04-19 NOTE — Progress Notes (Deleted)
 Subjective:    Patient ID: Brenda Grimes, female    DOB: [birth year], 58 y.o.   MRN: [id number]  HPI  Patient presents to clinic today for 63-month follow-up of chronic conditions.  HLD with CAD: Her last LDL was unable to be calculated, triglycerides 410, 10/2023.  She denies myalgias on simvastatin . She is taking aspirin  as well. She tries to consume low-fat diet.  Hypothyroidism: She denies any issues on her current dose of levothyroxine .  She does not follow with endocrinology.  History of anal cancer, chronic abdominal pain with diarrhea: In remission.  Her symptoms are managed with imodium, zofran  and tramadol .  She continues to follow with GI.  OA/chronic pain: Mainly in her back and hips.  She takes tylenol,, cyclobenzaprine , tramadol  as prescribed.  She is no longer taking gabapentin .  She does not follow with orthopedics.  Depression (moderate, recurrent): Chronic, managed on escitalopram .  She is not currently seeing a therapist.  She denies anxiety, SI/HI.  GERD: Triggered by spicy foods.  She takes omeprazole  only as needed.  There is no upper GI on file.  Neutropenia: Her last WBC count was 3.7, 02/2024.  She does not follow with hematology.  Review of Systems     Past Medical History:  Diagnosis Date   anal cancer    anal cancer , underwent radiation and chemo   Arthritis    CAD (coronary artery disease)    s/p stenting x 2   Colon polyp    Depression    Hyperlipidemia    MI (myocardial infarction) (HCC)    Thyroid  disease    Vitamin D  deficiency     Current Outpatient Medications  Medication Sig Dispense Refill   acetaminophen (TYLENOL) 500 MG tablet Take 1,000 mg by mouth every 6 (six) hours as needed.     aspirin  EC 81 MG tablet Take 1 tablet (81 mg total) by mouth daily. Swallow whole. 30 tablet 12   Cholecalciferol (VITAMIN D -3 PO) Take by mouth.     Cyanocobalamin  (VITAMIN B-12 PO) Take by mouth.     cyclobenzaprine  (FLEXERIL ) 10 MG tablet Take 1  tablet by oral route at bedtime. 30 tablet 0   escitalopram  (LEXAPRO ) 20 MG tablet Take 1 tablet (20 mg total) by mouth daily. 90 tablet 0   hydrochlorothiazide  (MICROZIDE ) 12.5 MG capsule Take 1 capsule (12.5 mg total) by mouth daily as needed. (Patient not taking: Reported on 02/16/2024) 30 capsule 0   ibuprofen (ADVIL) 200 MG tablet Take 200 mg by mouth every 6 (six) hours as needed.     levothyroxine  (SYNTHROID ) 88 MCG tablet TAKE 1 TABLET BY MOUTH ONCE DAILY BEFORE BREAKFAST 90 tablet 0   loperamide (IMODIUM) 2 MG capsule Take 2 mg by mouth as needed for diarrhea or loose stools.     nitroGLYCERIN  (NITROSTAT ) 0.4 MG SL tablet Place under the tongue.     omeprazole  (PRILOSEC  OTC) 20 MG tablet Take 1 tablet (20 mg total) by mouth daily.     simvastatin  (ZOCOR ) 10 MG tablet Take 1 tablet (10 mg total) by mouth at bedtime. 90 tablet 1   traMADol  (ULTRAM ) 50 MG tablet Take 1 tablet (50 mg total) by mouth daily as needed. 30 tablet 0   Vitamin D , Ergocalciferol , (DRISDOL ) 1.25 MG (50000 UNIT) CAPS capsule Take 1 capsule (50,000 Units total) by mouth once a week. For 12 weeks. Then start OTC Vitamin D3 2,000 unit daily. 12 capsule 0   No current facility-administered medications for this visit.  Allergies  Allergen Reactions   Sulfa Antibiotics Rash   Sulfasalazine Rash   Tape Rash    Family History  Problem Relation Age of Onset   Alzheimer's disease Mother    CAD Father    Rheum arthritis Father    Osteoporosis Father    Thyroid  disease Brother    Hyperlipidemia Brother    Hyperlipidemia Brother    Alcohol abuse Brother    Thyroid  disease Brother    Cirrhosis Brother    Alzheimer's disease Maternal Grandmother    Heart disease Paternal Grandmother    Heart disease Paternal Grandfather    Healthy Daughter    Healthy Son    Healthy Son    Cancer Paternal Aunt        Possible colon cancer   Heart disease Paternal Uncle    Breast cancer Cousin     Social History    Socioeconomic History   Marital status: Married    Spouse name: Not on file   Number of children: Not on file   Years of education: Not on file   Highest education level: Not on file  Occupational History   Not on file  Tobacco Use   Smoking status: Former    Current packs/day: 0.00    Average packs/day: 0.8 packs/day for 30.0 years (22.5 ttl pk-yrs)    Types: Cigarettes    Start date: 01/20/1977    Quit date: 01/21/2007    Years since quitting: 17.2   Smokeless tobacco: Never  Vaping Use   Vaping status: Never Used  Substance and Sexual Activity   Alcohol use: No   Drug use: No   Sexual activity: Not Currently    Birth control/protection: None  Other Topics Concern   Not on file  Social History Narrative   Not on file   Social Drivers of Health   Tobacco Use: Medium Risk (02/25/2024)   Received from [facility]   Patient History    Smoking Tobacco Use: Former    Smokeless Tobacco Use: Never    Passive Exposure: Past  Physicist, Medical Strain: Low Risk (12/11/2022)   Overall Financial Resource Strain (CARDIA)    Difficulty of Paying Living Expenses: Not very hard  Food Insecurity: No Food Insecurity (12/11/2022)   Hunger Vital Sign    Worried About Running Out of Food in the Last Year: Never true    Ran Out of Food in the Last Year: Never true  Transportation Needs: No Transportation Needs (12/11/2022)   PRAPARE - Administrator, Civil Service (Medical): No    Lack of Transportation (Non-Medical): No  Physical Activity: Insufficiently Active (12/11/2022)   Exercise Vital Sign    Days of Exercise per Week: 2 days    Minutes of Exercise per Session: 20 min  Stress: No Stress Concern Present (12/11/2022)   [employer] of Occupational Health - Occupational Stress Questionnaire    Feeling of Stress : Only a little  Social Connections: Moderately Isolated (12/11/2022)   Social Connection and Isolation Panel    Frequency of Communication with Friends and  Family: More than three times a week    Frequency of Social Gatherings with Friends and Family: More than three times a week    Attends Religious Services: Never    Database Administrator or Organizations: No    Attends Banker Meetings: Never    Marital Status: Married  Catering Manager Violence: Not At Risk (12/11/2022)   Humiliation, Afraid, Rape,  and Kick questionnaire    Fear of Current or Ex-Partner: No    Emotionally Abused: No    Physically Abused: No    Sexually Abused: No  Depression (PHQ2-9): Low Risk (01/27/2024)   Depression (PHQ2-9)    PHQ-2 Score: 4  Alcohol Screen: Low Risk (12/11/2022)   Alcohol Screen    Last Alcohol Screening Score (AUDIT): 0  Housing: Low Risk (12/11/2022)   Housing    Last Housing Risk Score: 0  Utilities: Not At Risk (12/11/2022)   AHC Utilities    Threatened with loss of utilities: No  Health Literacy: Adequate Health Literacy (12/11/2022)   B1300 Health Literacy    Frequency of need for help with medical instructions: Never     Constitutional: Denies fever, malaise, fatigue, headache or abrupt weight changes.  HEENT: Denies eye pain, eye redness, ear pain, ringing in the ears, wax buildup, runny nose, nasal congestion, bloody nose, or sore throat. Respiratory: Denies difficulty breathing, shortness of breath, cough or sputum production.   Cardiovascular: Denies chest pain, chest tightness, palpitations or swelling in the hands or feet.  Gastrointestinal: Patient reports chronic abdominal pain and diarrhea.  Denies bloating, constipation,or blood in the stool.  GU: Denies urgency, frequency, pain with urination, burning sensation, blood in urine, odor or discharge. Musculoskeletal: Patient reports chronic joint pain.  Denies decrease in range of motion, difficulty with gait, muscle pain or joint swelling.  Skin: Denies redness, rashes, lesions or ulcercations.  Neurological: Denies dizziness, difficulty with memory, difficulty with  speech or problems with balance and coordination.  Psych: Patient has a history of depression.  Denies anxiety, SI/HI.  No other specific complaints in a complete review of systems (except as listed in HPI above).  Objective:   Physical Exam  There were no vitals taken for this visit.   Wt Readings from Last 3 Encounters:  02/16/24 184 lb (83.5 kg)  01/27/24 184 lb (83.5 kg)  10/19/23 186 lb (84.4 kg)    General: Appears her stated age, obese in NAD. Skin: Warm, dry and intact.  HEENT: Head: normal shape and size; Eyes: sclera white, no icterus, conjunctiva pink, PERRLA and EOMs intact;  Neck:  Neck supple, trachea midline. No masses, lumps or thyromegaly present.  Cardiovascular: Normal rate and rhythm. S1,S2 noted.  No murmur, rubs or gallops noted. No JVD or BLE edema. No carotid bruits noted. Pulmonary/Chest: Normal effort and positive vesicular breath sounds. No respiratory distress. No wheezes, rales or ronchi noted.  Abdomen: Soft and nontender. Normal bowel sounds.  Musculoskeletal:  Pain with palpation at the right lateral ribs. No difficulty with gait.  Neurological: Alert and oriented.  Coordination normal.  Psychiatric: Mood and affect normal. Behavior is normal. Judgment and thought content normal.     BMET    Component Value Date/Time   NA 141 10/19/2023 1348   K 3.8 10/19/2023 1348   CL 105 10/19/2023 1348   CO2 25 10/19/2023 1348   GLUCOSE 125 10/19/2023 1348   BUN 20 10/19/2023 1348   CREATININE 0.78 10/19/2023 1348   CALCIUM  9.4 10/19/2023 1348   GFRNONAA >60 08/08/2021 1509   GFRNONAA 79 04/04/2020 0904   GFRAA 91 04/04/2020 0904    Lipid Panel     Component Value Date/Time   CHOL 228 (H) 10/19/2023 1348   TRIG 410 (H) 10/19/2023 1348   HDL 56 10/19/2023 1348   CHOLHDL 4.1 10/19/2023 1348   LDLCALC  10/19/2023 1348     Comment:     .  LDL cholesterol not calculated. Triglyceride levels greater than 400 mg/dL invalidate calculated LDL  results. . Reference range: <100 . Desirable range <100 mg/dL for primary prevention;   <70 mg/dL for patients with CHD or diabetic patients  with > or = 2 CHD risk factors. SABRA LDL-C is now calculated using the Martin-Hopkins  calculation, which is a validated novel method providing  better accuracy than the Friedewald equation in the  estimation of LDL-C.  Gladis APPLETHWAITE et al. SANDREA. 7986;689(80): 2061-2068  (http://education.[facility].com/faq/FAQ164)     CBC    Component Value Date/Time   WBC 4.3 10/19/2023 1348   RBC 3.94 10/19/2023 1348   HGB 13.2 10/19/2023 1348   HCT 40.2 10/19/2023 1348   PLT 206 10/19/2023 1348   MCV 102.0 (H) 10/19/2023 1348   MCH 33.5 (H) 10/19/2023 1348   MCHC 32.8 10/19/2023 1348   RDW 12.9 10/19/2023 1348   LYMPHSABS 921 07/24/2021 1010   MONOABS 0.3 04/15/2020 1209   EOSABS 60 07/24/2021 1010   BASOSABS 39 07/24/2021 1010    Hgb A1C Lab Results  Component Value Date   HGBA1C 5.5 10/19/2023            Assessment & Plan:    RTC in 6 months for your annual exam Angeline Laura, NP

## 2024-04-20 ENCOUNTER — Ambulatory Visit: Admitting: Internal Medicine

## 2024-04-20 ENCOUNTER — Encounter: Payer: Self-pay | Admitting: Internal Medicine

## 2024-05-03 ENCOUNTER — Other Ambulatory Visit: Payer: Self-pay | Admitting: Internal Medicine

## 2024-05-04 NOTE — Telephone Encounter (Signed)
 Requested Prescriptions  Pending Prescriptions Disp Refills   escitalopram  (LEXAPRO ) 20 MG tablet [Pharmacy Med Name: Escitalopram  Oxalate 20 MG Oral Tablet] 30 tablet 0    Sig: Take 1 tablet by mouth once daily     Psychiatry:  Antidepressants - SSRI Failed - 05/04/2024 12:14 PM      Failed - Valid encounter within last 6 months    Recent Outpatient Visits           3 months ago History of anal cancer   [facility] [place], Angeline ORN, NP   6 months ago Encounter for general adult medical examination with abnormal findings   [facility] [place], Angeline ORN, NP   8 months ago Rash of face   [facility] [place], [place], NP              Passed - Completed PHQ-2 or PHQ-9 in the last 360 days

## 2024-05-25 ENCOUNTER — Encounter: Payer: Self-pay | Admitting: Internal Medicine

## 2024-05-26 MED ORDER — TRAMADOL HCL 50 MG PO TABS: 50.0000 mg | ORAL_TABLET | Freq: Every day | ORAL | 0 refills | Status: AC | PRN

## 2024-05-30 ENCOUNTER — Encounter: Payer: Self-pay | Admitting: Internal Medicine

## 2024-05-30 ENCOUNTER — Ambulatory Visit: Admitting: Internal Medicine

## 2024-05-30 VITALS — BP 108/64 | Ht 63.0 in | Wt 191.0 lb

## 2024-05-30 DIAGNOSIS — M8580 Other specified disorders of bone density and structure, unspecified site: Secondary | ICD-10-CM | POA: Insufficient documentation

## 2024-05-30 DIAGNOSIS — K529 Noninfective gastroenteritis and colitis, unspecified: Secondary | ICD-10-CM

## 2024-05-30 DIAGNOSIS — D701 Agranulocytosis secondary to cancer chemotherapy: Secondary | ICD-10-CM | POA: Diagnosis not present

## 2024-05-30 DIAGNOSIS — M5137 Other intervertebral disc degeneration, lumbosacral region with discogenic back pain only: Secondary | ICD-10-CM | POA: Diagnosis not present

## 2024-05-30 DIAGNOSIS — E538 Deficiency of other specified B group vitamins: Secondary | ICD-10-CM | POA: Diagnosis not present

## 2024-05-30 DIAGNOSIS — E785 Hyperlipidemia, unspecified: Secondary | ICD-10-CM

## 2024-05-30 DIAGNOSIS — R5382 Chronic fatigue, unspecified: Secondary | ICD-10-CM

## 2024-05-30 DIAGNOSIS — E66811 Obesity, class 1: Secondary | ICD-10-CM

## 2024-05-30 DIAGNOSIS — F331 Major depressive disorder, recurrent, moderate: Secondary | ICD-10-CM

## 2024-05-30 DIAGNOSIS — M8589 Other specified disorders of bone density and structure, multiple sites: Secondary | ICD-10-CM

## 2024-05-30 DIAGNOSIS — E039 Hypothyroidism, unspecified: Secondary | ICD-10-CM

## 2024-05-30 DIAGNOSIS — E559 Vitamin D deficiency, unspecified: Secondary | ICD-10-CM | POA: Diagnosis not present

## 2024-05-30 DIAGNOSIS — K219 Gastro-esophageal reflux disease without esophagitis: Secondary | ICD-10-CM

## 2024-05-30 DIAGNOSIS — Z85048 Personal history of other malignant neoplasm of rectum, rectosigmoid junction, and anus: Secondary | ICD-10-CM

## 2024-05-30 DIAGNOSIS — R739 Hyperglycemia, unspecified: Secondary | ICD-10-CM | POA: Diagnosis not present

## 2024-05-30 DIAGNOSIS — I25119 Atherosclerotic heart disease of native coronary artery with unspecified angina pectoris: Secondary | ICD-10-CM

## 2024-05-30 DIAGNOSIS — G8929 Other chronic pain: Secondary | ICD-10-CM | POA: Insufficient documentation

## 2024-05-30 NOTE — Assessment & Plan Note (Signed)
 Complicated by obesity C-Met and lipid profile today Continue simvastatin  40 mg daily, did discuss switching to atorvastatin 80 mg if LDL or triglycerides remain elevated Encouraged her to consume low-fat diet

## 2024-05-30 NOTE — Progress Notes (Signed)
 "  Subjective:    Patient ID: Brenda Grimes, female    DOB: 1965/10/24, 59 y.o.   MRN: [id number]  HPI  Patient presents to clinic today for 58-month follow-up of chronic conditions.  HLD with CAD: Her last LDL was not calculated, triglycerides 410, 10/2023.  She denies myalgias on simvastatin .  I reached out to her after her last appointment 10/2023 to see if she would be okay with changing simvastatin  to atorvastatin however she did not respond.  She is taking aspirin . She tries to consume low-fat diet.  Hypothyroidism: She reports worsening fatigue and lack of endurance.  She is taking levothyroxine  as prescribed.  She does not follow with endocrinology.  History of anal cancer, chronic abdominal pain with diarrhea: She reports worsening abdominal pain, diarrhea and blood in stool. Her symptoms are managed with imodium, ondansetron  and tramadol .  She continues to follow with GI and does feel like she needs a repeat colonoscopy at this time.  Her last colonoscopy was in 2022 with a 7-year recall.  OA/chronic pain: Mainly in her back and hips. She has been having worsening pain in her shoulders and hands. She takes tylenol, tramadol  as prescribed.  She is no longer taking gabapentin .  She does not follow with orthopedics.  Depression (moderate, recurrent): Chronic, managed on escitalopram .  She is not currently seeing a therapist.  She denies anxiety, SI/HI.  GERD: Triggered by spicy foods.  She takes omeprazole  as needed.  There is no upper GI on file.  Neutropenia: Her last WBC count was 3.7, 02/2024.  She does not follow with hematology.  Osteopenia: She is taking vit d but is not taking any calcium  OTC. She is not getting any weightbearing exercise at time. Bone density from 05/2023 reviewed.  Review of Systems     Past Medical History:  Diagnosis Date   anal cancer    anal cancer , underwent radiation and chemo   Arthritis    CAD (coronary artery disease)    s/p stenting x 2    Colon polyp    Depression    Hyperlipidemia    MI (myocardial infarction) (HCC)    Thyroid  disease    Vitamin D  deficiency     Current Outpatient Medications  Medication Sig Dispense Refill   acetaminophen (TYLENOL) 500 MG tablet Take 1,000 mg by mouth every 6 (six) hours as needed.     aspirin  EC 81 MG tablet Take 1 tablet (81 mg total) by mouth daily. Swallow whole. 30 tablet 12   Cholecalciferol (VITAMIN D -3 PO) Take by mouth.     Cyanocobalamin  (VITAMIN B-12 PO) Take by mouth.     cyclobenzaprine  (FLEXERIL ) 10 MG tablet Take 1 tablet by oral route at bedtime. 30 tablet 0   escitalopram  (LEXAPRO ) 20 MG tablet Take 1 tablet by mouth once daily 30 tablet 0   hydrochlorothiazide  (MICROZIDE ) 12.5 MG capsule Take 1 capsule (12.5 mg total) by mouth daily as needed. (Patient not taking: Reported on 02/16/2024) 30 capsule 0   ibuprofen (ADVIL) 200 MG tablet Take 200 mg by mouth every 6 (six) hours as needed.     levothyroxine  (SYNTHROID ) 88 MCG tablet TAKE 1 TABLET BY MOUTH ONCE DAILY BEFORE BREAKFAST 90 tablet 0   loperamide (IMODIUM) 2 MG capsule Take 2 mg by mouth as needed for diarrhea or loose stools.     nitroGLYCERIN  (NITROSTAT ) 0.4 MG SL tablet Place under the tongue.     omeprazole  (PRILOSEC  OTC) 20 MG tablet Take 1 tablet (  20 mg total) by mouth daily.     simvastatin  (ZOCOR ) 10 MG tablet Take 1 tablet (10 mg total) by mouth at bedtime. 90 tablet 1   traMADol  (ULTRAM ) 50 MG tablet Take 1 tablet (50 mg total) by mouth daily as needed. 30 tablet 0   Vitamin D , Ergocalciferol , (DRISDOL ) 1.25 MG (50000 UNIT) CAPS capsule Take 1 capsule (50,000 Units total) by mouth once a week. For 12 weeks. Then start OTC Vitamin D3 2,000 unit daily. 12 capsule 0   No current facility-administered medications for this visit.    Allergies  Allergen Reactions   Sulfa Antibiotics Rash   Sulfasalazine Rash   Tape Rash    Family History  Problem Relation Age of Onset   Alzheimer's disease Mother     CAD Father    Rheum arthritis Father    Osteoporosis Father    Thyroid  disease Brother    Hyperlipidemia Brother    Hyperlipidemia Brother    Alcohol abuse Brother    Thyroid  disease Brother    Cirrhosis Brother    Alzheimer's disease Maternal Grandmother    Heart disease Paternal Grandmother    Heart disease Paternal Grandfather    Healthy Daughter    Healthy Son    Healthy Son    Cancer Paternal Aunt        Possible colon cancer   Heart disease Paternal Uncle    Breast cancer Cousin     Social History   Socioeconomic History   Marital status: Married    Spouse name: Not on file   Number of children: Not on file   Years of education: Not on file   Highest education level: Not on file  Occupational History   Not on file  Tobacco Use   Smoking status: Former    Current packs/day: 0.00    Average packs/day: 0.8 packs/day for 30.0 years (22.5 ttl pk-yrs)    Types: Cigarettes    Start date: 01/20/1977    Quit date: 01/21/2007    Years since quitting: 17.3   Smokeless tobacco: Never  Vaping Use   Vaping status: Never Used  Substance and Sexual Activity   Alcohol use: No   Drug use: No   Sexual activity: Not Currently    Birth control/protection: None  Other Topics Concern   Not on file  Social History Narrative   Not on file   Social Drivers of Health   Tobacco Use: Medium Risk (02/25/2024)   Received from [facility]   Patient History    Smoking Tobacco Use: Former    Smokeless Tobacco Use: Never    Passive Exposure: Past  Physicist, Medical Strain: Low Risk (12/11/2022)   Overall Financial Resource Strain (CARDIA)    Difficulty of Paying Living Expenses: Not very hard  Food Insecurity: No Food Insecurity (12/11/2022)   Hunger Vital Sign    Worried About Running Out of Food in the Last Year: Never true    Ran Out of Food in the Last Year: Never true  Transportation Needs: No Transportation Needs (12/11/2022)   PRAPARE - Scientist, Research (physical Sciences) (Medical): No    Lack of Transportation (Non-Medical): No  Physical Activity: Insufficiently Active (12/11/2022)   Exercise Vital Sign    Days of Exercise per Week: 2 days    Minutes of Exercise per Session: 20 min  Stress: No Stress Concern Present (12/11/2022)   [employer] of Occupational Health - Occupational Stress Questionnaire  Feeling of Stress : Only a little  Social Connections: Moderately Isolated (12/11/2022)   Social Connection and Isolation Panel    Frequency of Communication with Friends and Family: More than three times a week    Frequency of Social Gatherings with Friends and Family: More than three times a week    Attends Religious Services: Never    Database Administrator or Organizations: No    Attends Banker Meetings: Never    Marital Status: Married  Catering Manager Violence: Not At Risk (12/11/2022)   Humiliation, Afraid, Rape, and Kick questionnaire    Fear of Current or Ex-Partner: No    Emotionally Abused: No    Physically Abused: No    Sexually Abused: No  Depression (PHQ2-9): Low Risk (01/27/2024)   Depression (PHQ2-9)    PHQ-2 Score: 4  Alcohol Screen: Low Risk (12/11/2022)   Alcohol Screen    Last Alcohol Screening Score (AUDIT): 0  Housing: Low Risk (12/11/2022)   Housing    Last Housing Risk Score: 0  Utilities: Not At Risk (12/11/2022)   AHC Utilities    Threatened with loss of utilities: No  Health Literacy: Adequate Health Literacy (12/11/2022)   B1300 Health Literacy    Frequency of need for help with medical instructions: Never     Constitutional: Pt reports fatigue. Denies fever, malaise, headache or abrupt weight changes.  HEENT: Denies eye pain, eye redness, ear pain, ringing in the ears, wax buildup, runny nose, nasal congestion, bloody nose, or sore throat. Respiratory: Denies difficulty breathing, shortness of breath, cough or sputum production.   Cardiovascular: Denies chest pain, chest tightness,  palpitations or swelling in the hands or feet.  Gastrointestinal: Patient reports chronic abdominal pain, diarrhea and blood in stool.  Denies bloating, constipation.  GU: Denies urgency, frequency, pain with urination, burning sensation, blood in urine, odor or discharge. Musculoskeletal: Patient reports chronic joint pain.  Denies decrease in range of motion, difficulty with gait, muscle pain or joint swelling.  Skin: Denies redness, rashes, lesions or ulcercations.  Neurological: Denies dizziness, difficulty with memory, difficulty with speech or problems with balance and coordination.  Psych: Patient has a history of depression.  Denies anxiety, SI/HI.  No other specific complaints in a complete review of systems (except as listed in HPI above).  Objective:   Physical Exam  BP 108/64 (BP Location: Left Arm, Patient Position: Sitting, Cuff Size: Normal)   Ht 5' 3 (1.6 m)   Wt 191 lb (86.6 kg)   BMI 33.83 kg/m     Wt Readings from Last 3 Encounters:  02/16/24 184 lb (83.5 kg)  01/27/24 184 lb (83.5 kg)  10/19/23 186 lb (84.4 kg)    General: Appears her stated age, obese in NAD. Skin: Warm, dry and intact.  HEENT: Head: normal shape and size; Eyes: sclera white, no icterus, conjunctiva pink, PERRLA and EOMs intact;  Neck:  Neck supple, trachea midline. No masses, lumps or thyromegaly present.  Cardiovascular: Normal rate and rhythm. S1,S2 noted.  No murmur, rubs or gallops noted. No JVD or BLE edema. No carotid bruits noted. Pulmonary/Chest: Normal effort and positive vesicular breath sounds. No respiratory distress. No wheezes, rales or ronchi noted.  Abdomen: Soft and generally tender. Normal bowel sounds.  Musculoskeletal: Pain with palpation over the thoracic and lumbar spine.  No joint swelling noted.  Strength 5/5 BUE.  Handgrips equal.  No difficulty with gait.  Neurological: Alert and oriented.  Coordination normal.  Psychiatric: Mood and affect  normal. Behavior is  normal. Judgment and thought content normal.     BMET    Component Value Date/Time   NA 141 10/19/2023 1348   K 3.8 10/19/2023 1348   CL 105 10/19/2023 1348   CO2 25 10/19/2023 1348   GLUCOSE 125 10/19/2023 1348   BUN 20 10/19/2023 1348   CREATININE 0.78 10/19/2023 1348   CALCIUM  9.4 10/19/2023 1348   GFRNONAA >60 08/08/2021 1509   GFRNONAA 79 04/04/2020 0904   GFRAA 91 04/04/2020 0904    Lipid Panel     Component Value Date/Time   CHOL 228 (H) 10/19/2023 1348   TRIG 410 (H) 10/19/2023 1348   HDL 56 10/19/2023 1348   CHOLHDL 4.1 10/19/2023 1348   LDLCALC  10/19/2023 1348     Comment:     . LDL cholesterol not calculated. Triglyceride levels greater than 400 mg/dL invalidate calculated LDL results. . Reference range: <100 . Desirable range <100 mg/dL for primary prevention;   <70 mg/dL for patients with CHD or diabetic patients  with > or = 2 CHD risk factors. SABRA LDL-C is now calculated using the Martin-Hopkins  calculation, which is a validated novel method providing  better accuracy than the Friedewald equation in the  estimation of LDL-C.  Gladis APPLETHWAITE et al. SANDREA. 7986;689(80): 2061-2068  (http://education.[facility].com/faq/FAQ164)     CBC    Component Value Date/Time   WBC 4.3 10/19/2023 1348   RBC 3.94 10/19/2023 1348   HGB 13.2 10/19/2023 1348   HCT 40.2 10/19/2023 1348   PLT 206 10/19/2023 1348   MCV 102.0 (H) 10/19/2023 1348   MCH 33.5 (H) 10/19/2023 1348   MCHC 32.8 10/19/2023 1348   RDW 12.9 10/19/2023 1348   LYMPHSABS 921 07/24/2021 1010   MONOABS 0.3 04/15/2020 1209   EOSABS 60 07/24/2021 1010   BASOSABS 39 07/24/2021 1010    Hgb A1C Lab Results  Component Value Date   HGBA1C 5.5 10/19/2023            Assessment & Plan:   Chronic fatigue:  She reports she is sleeping well and mood is well-controlled DDx include deconditioning, anemia, hypothyroidism with TSH levels that are off, vitamin D  or B12 deficiency Will check  CBC, TSH, vitamin D  and B12 today  RTC in 5 months for your annual exam Angeline Laura, NP   "

## 2024-05-30 NOTE — Assessment & Plan Note (Signed)
In remission She will continue to follow with GI 

## 2024-05-30 NOTE — Assessment & Plan Note (Addendum)
 Encourage regular stretching and core strengthening Encourage weight loss as this can help reduce joint pain Continue tylenol OTC and tramadol  50 mg daily as needed

## 2024-05-30 NOTE — Assessment & Plan Note (Signed)
 Advised her to take 600 mg of calcium  along with 2000 units vitamin D3 Encouraged daily weightbearing exercise

## 2024-05-30 NOTE — Assessment & Plan Note (Signed)
 Continue escitalopram  20 mg daily Support offered

## 2024-05-30 NOTE — Assessment & Plan Note (Signed)
 Encourage regular stretching and core strengthening Encourage weight loss as this can help reduce joint pain Continue tylenol OTC and tramadol  50 mg daily as needed

## 2024-05-30 NOTE — Assessment & Plan Note (Signed)
 TSH and free T4 today Continue levothyroxine  88 mcg, will adjust if needed based on labs

## 2024-05-30 NOTE — Patient Instructions (Signed)

## 2024-05-30 NOTE — Assessment & Plan Note (Signed)
 Complicated by obesity Avoid foods that trigger reflux Encourage weight loss as this can help reduce reflux symptoms Continue omeprazole  20 mg daily as needed

## 2024-05-30 NOTE — Assessment & Plan Note (Signed)
 Encouraged diet and exercise for weight loss ?

## 2024-05-30 NOTE — Assessment & Plan Note (Signed)
 Likely due to history of anal cancer Continue imodium 2 mg OTC, ondansetron  4 mg every 8 hours as needed and tramadol  50 mg daily as needed Referral to GI for consideration of colonoscopy per her request

## 2024-05-30 NOTE — Addendum Note (Signed)
 Addended by: ANTONETTE ANGELINE ORN on: 05/30/2024 11:13 AM   Modules accepted: Level of Service

## 2024-05-30 NOTE — Assessment & Plan Note (Signed)
 CBC today.

## 2024-05-30 NOTE — Assessment & Plan Note (Addendum)
 Complicated by obesity C-Met and lipid profile today Continue simvastatin  and aspirin  81 mg daily Consider switching simvastatin  to atorvastatin 80 mg pending labs Encouraged her to consume a low-fat diet

## 2024-05-31 ENCOUNTER — Encounter: Payer: Self-pay | Admitting: Internal Medicine

## 2024-05-31 ENCOUNTER — Ambulatory Visit: Payer: Self-pay | Admitting: Internal Medicine

## 2024-05-31 ENCOUNTER — Ambulatory Visit: Admitting: Orthopedic Surgery

## 2024-05-31 LAB — COMPREHENSIVE METABOLIC PANEL WITH GFR
AG Ratio: 2.2 (calc) (ref 1.0–2.5)
ALT: 11 U/L (ref 6–29)
AST: 17 U/L (ref 10–35)
Albumin: 4.1 g/dL (ref 3.6–5.1)
Alkaline phosphatase (APISO): 72 U/L (ref 37–153)
BUN: 20 mg/dL (ref 7–25)
CO2: 29 mmol/L (ref 20–32)
Calcium: 9.1 mg/dL (ref 8.6–10.4)
Chloride: 106 mmol/L (ref 98–110)
Creat: 0.71 mg/dL (ref 0.50–1.05)
Globulin: 1.9 g/dL (ref 1.9–3.7)
Glucose, Bld: 86 mg/dL (ref 65–139)
Potassium: 4.6 mmol/L (ref 3.5–5.3)
Sodium: 141 mmol/L (ref 135–146)
Total Bilirubin: 0.5 mg/dL (ref 0.2–1.2)
Total Protein: 6 g/dL — ABNORMAL LOW (ref 6.1–8.1)
eGFR: 97 mL/min/{1.73_m2}

## 2024-05-31 LAB — CBC
HCT: 36.7 % (ref 35.9–46.0)
Hemoglobin: 12 g/dL (ref 11.7–15.5)
MCH: 33 pg (ref 27.0–33.0)
MCHC: 32.7 g/dL (ref 31.6–35.4)
MCV: 100.8 fL (ref 81.4–101.7)
MPV: 9.8 fL (ref 7.5–12.5)
Platelets: 192 10*3/uL (ref 140–400)
RBC: 3.64 Million/uL — ABNORMAL LOW (ref 3.80–5.10)
RDW: 12.5 % (ref 11.0–15.0)
WBC: 3.5 10*3/uL — ABNORMAL LOW (ref 3.8–10.8)

## 2024-05-31 LAB — HEMOGLOBIN A1C
Hgb A1c MFr Bld: 5.2 %
Mean Plasma Glucose: 103 mg/dL
eAG (mmol/L): 5.7 mmol/L

## 2024-05-31 LAB — VITAMIN D 25 HYDROXY (VIT D DEFICIENCY, FRACTURES): Vit D, 25-Hydroxy: 44 ng/mL (ref 30–100)

## 2024-05-31 LAB — LIPID PANEL
Cholesterol: 220 mg/dL — ABNORMAL HIGH
HDL: 51 mg/dL
LDL Cholesterol (Calc): 136 mg/dL — ABNORMAL HIGH
Non-HDL Cholesterol (Calc): 169 mg/dL — ABNORMAL HIGH
Total CHOL/HDL Ratio: 4.3 (calc)
Triglycerides: 189 mg/dL — ABNORMAL HIGH

## 2024-05-31 LAB — T4, FREE: Free T4: 1.2 ng/dL (ref 0.8–1.8)

## 2024-05-31 LAB — TSH: TSH: 1.23 m[IU]/L (ref 0.40–4.50)

## 2024-05-31 LAB — VITAMIN B12: Vitamin B-12: 938 pg/mL (ref 200–1100)

## 2024-06-06 ENCOUNTER — Other Ambulatory Visit: Payer: Self-pay | Admitting: Internal Medicine

## 2024-06-07 NOTE — Telephone Encounter (Signed)
 Requested Prescriptions  Pending Prescriptions Disp Refills   levothyroxine  (SYNTHROID ) 88 MCG tablet [Pharmacy Med Name: Levothyroxine  Sodium 88 MCG Oral Tablet] 90 tablet 1    Sig: TAKE 1 TABLET BY MOUTH ONCE DAILY BEFORE BREAKFAST     Endocrinology:  Hypothyroid Agents Passed - 06/07/2024 12:54 PM      Passed - TSH in normal range and within 360 days    TSH  Date Value Ref Range Status  05/30/2024 1.23 0.40 - 4.50 mIU/L Final         Passed - Valid encounter within last 12 months    Recent Outpatient Visits           1 week ago Coronary artery disease involving native coronary artery of native heart with angina pectoris   [place] [facility] [place], Angeline ORN, NP   4 months ago History of anal cancer   [place] [facility] [place], Angeline ORN, NP   7 months ago Encounter for general adult medical examination with abnormal findings   [place] [facility] [place], Angeline ORN, NP   10 months ago Rash of face   [place] [facility] [place], Angeline ORN, [place]

## 2024-06-08 ENCOUNTER — Encounter: Payer: Self-pay | Admitting: Internal Medicine

## 2024-10-24 ENCOUNTER — Encounter: Admitting: Internal Medicine
